# Patient Record
Sex: Female | Born: 1965 | Race: White | Hispanic: No | Marital: Married | Smoking: Current every day smoker
Health system: Southern US, Community
[De-identification: ages and names within clinical notes are randomized; demographics above are authoritative.]

## PROBLEM LIST (undated history)

## (undated) DIAGNOSIS — I890 Lymphedema, not elsewhere classified: Secondary | ICD-10-CM

## (undated) DIAGNOSIS — C50919 Malignant neoplasm of unspecified site of unspecified female breast: Secondary | ICD-10-CM

## (undated) DIAGNOSIS — C773 Secondary and unspecified malignant neoplasm of axilla and upper limb lymph nodes: Secondary | ICD-10-CM

## (undated) HISTORY — DX: Malignant neoplasm of unspecified site of unspecified female breast: C50.919

## (undated) HISTORY — DX: Secondary and unspecified malignant neoplasm of axilla and upper limb lymph nodes: C77.3

---

## 1999-08-10 ENCOUNTER — Other Ambulatory Visit: Admission: RE | Admit: 1999-08-10 | Discharge: 1999-08-10 | Payer: Self-pay | Admitting: *Deleted

## 2000-09-07 ENCOUNTER — Other Ambulatory Visit: Admission: RE | Admit: 2000-09-07 | Discharge: 2000-09-07 | Payer: Self-pay | Admitting: *Deleted

## 2001-10-03 ENCOUNTER — Other Ambulatory Visit: Admission: RE | Admit: 2001-10-03 | Discharge: 2001-10-03 | Payer: Self-pay | Admitting: *Deleted

## 2001-10-07 ENCOUNTER — Encounter: Admission: RE | Admit: 2001-10-07 | Discharge: 2001-10-07 | Payer: Self-pay | Admitting: *Deleted

## 2001-10-07 ENCOUNTER — Encounter: Payer: Self-pay | Admitting: *Deleted

## 2002-12-05 ENCOUNTER — Other Ambulatory Visit: Admission: RE | Admit: 2002-12-05 | Discharge: 2002-12-05 | Payer: Self-pay | Admitting: *Deleted

## 2004-01-28 ENCOUNTER — Other Ambulatory Visit: Admission: RE | Admit: 2004-01-28 | Discharge: 2004-01-28 | Payer: Self-pay | Admitting: *Deleted

## 2006-09-06 ENCOUNTER — Encounter: Admission: RE | Admit: 2006-09-06 | Discharge: 2006-09-06 | Payer: Self-pay | Admitting: Obstetrics and Gynecology

## 2006-11-01 ENCOUNTER — Inpatient Hospital Stay (HOSPITAL_COMMUNITY): Admission: AD | Admit: 2006-11-01 | Discharge: 2006-11-04 | Payer: Self-pay | Admitting: Obstetrics and Gynecology

## 2007-07-25 ENCOUNTER — Encounter: Admission: RE | Admit: 2007-07-25 | Discharge: 2007-07-25 | Payer: Self-pay | Admitting: Obstetrics and Gynecology

## 2007-08-02 ENCOUNTER — Encounter (INDEPENDENT_AMBULATORY_CARE_PROVIDER_SITE_OTHER): Payer: Self-pay | Admitting: Diagnostic Radiology

## 2007-08-02 ENCOUNTER — Encounter: Admission: RE | Admit: 2007-08-02 | Discharge: 2007-08-02 | Payer: Self-pay | Admitting: Obstetrics and Gynecology

## 2007-08-02 DIAGNOSIS — C50919 Malignant neoplasm of unspecified site of unspecified female breast: Secondary | ICD-10-CM

## 2007-08-02 HISTORY — DX: Malignant neoplasm of unspecified site of unspecified female breast: C50.919

## 2007-08-07 ENCOUNTER — Ambulatory Visit: Payer: Self-pay | Admitting: Oncology

## 2007-08-11 ENCOUNTER — Encounter: Admission: RE | Admit: 2007-08-11 | Discharge: 2007-08-11 | Payer: Self-pay | Admitting: Obstetrics and Gynecology

## 2007-08-13 LAB — COMPREHENSIVE METABOLIC PANEL
Albumin: 4.4 g/dL (ref 3.5–5.2)
BUN: 11 mg/dL (ref 6–23)
CO2: 23 mEq/L (ref 19–32)
Calcium: 9.1 mg/dL (ref 8.4–10.5)
Chloride: 106 mEq/L (ref 96–112)
Glucose, Bld: 127 mg/dL — ABNORMAL HIGH (ref 70–99)
Potassium: 3.8 mEq/L (ref 3.5–5.3)

## 2007-08-13 LAB — CBC WITH DIFFERENTIAL/PLATELET
Basophils Absolute: 0 10*3/uL (ref 0.0–0.1)
Eosinophils Absolute: 0.1 10*3/uL (ref 0.0–0.5)
HGB: 15.5 g/dL (ref 11.6–15.9)
MCV: 98.8 fL (ref 81.0–101.0)
MONO#: 0.4 10*3/uL (ref 0.1–0.9)
MONO%: 6 % (ref 0.0–13.0)
NEUT#: 3.4 10*3/uL (ref 1.5–6.5)
RDW: 11.9 % (ref 11.3–14.5)
lymph#: 2.3 10*3/uL (ref 0.9–3.3)

## 2007-08-13 LAB — CANCER ANTIGEN 27.29: CA 27.29: 16 U/mL (ref 0–39)

## 2007-08-13 LAB — LACTATE DEHYDROGENASE: LDH: 121 U/L (ref 94–250)

## 2007-08-16 ENCOUNTER — Ambulatory Visit (HOSPITAL_COMMUNITY): Admission: RE | Admit: 2007-08-16 | Discharge: 2007-08-16 | Payer: Self-pay | Admitting: Oncology

## 2007-08-20 ENCOUNTER — Ambulatory Visit (HOSPITAL_COMMUNITY): Admission: RE | Admit: 2007-08-20 | Discharge: 2007-08-20 | Payer: Self-pay | Admitting: Oncology

## 2007-08-21 ENCOUNTER — Ambulatory Visit: Payer: Self-pay

## 2007-08-21 ENCOUNTER — Encounter: Payer: Self-pay | Admitting: Oncology

## 2007-09-06 ENCOUNTER — Ambulatory Visit (HOSPITAL_BASED_OUTPATIENT_CLINIC_OR_DEPARTMENT_OTHER): Admission: RE | Admit: 2007-09-06 | Discharge: 2007-09-06 | Payer: Self-pay | Admitting: General Surgery

## 2007-09-06 ENCOUNTER — Encounter (INDEPENDENT_AMBULATORY_CARE_PROVIDER_SITE_OTHER): Payer: Self-pay | Admitting: General Surgery

## 2007-09-09 LAB — CBC WITH DIFFERENTIAL/PLATELET
BASO%: 0.8 % (ref 0.0–2.0)
EOS%: 0.9 % (ref 0.0–7.0)
LYMPH%: 20.5 % (ref 14.0–48.0)
MCH: UNDETERMINED pg (ref 26.0–34.0)
MCHC: UNDETERMINED g/dL (ref 32.0–36.0)
MONO#: 0.5 10*3/uL (ref 0.1–0.9)
RBC: UNDETERMINED 10*6/uL (ref 3.70–5.32)
RDW: 10 % — ABNORMAL LOW (ref 11.3–14.5)
WBC: 7.8 10*3/uL (ref 3.9–10.0)
lymph#: 1.6 10*3/uL (ref 0.9–3.3)

## 2007-09-16 ENCOUNTER — Ambulatory Visit: Payer: Self-pay | Admitting: Oncology

## 2007-09-20 LAB — COMPREHENSIVE METABOLIC PANEL
ALT: 12 U/L (ref 0–35)
AST: 11 U/L (ref 0–37)
Albumin: 3.7 g/dL (ref 3.5–5.2)
CO2: 24 mEq/L (ref 19–32)
Calcium: 8.6 mg/dL (ref 8.4–10.5)
Chloride: 103 mEq/L (ref 96–112)
Creatinine, Ser: 0.64 mg/dL (ref 0.40–1.20)
Potassium: 4.1 mEq/L (ref 3.5–5.3)

## 2007-09-20 LAB — CBC WITH DIFFERENTIAL/PLATELET
BASO%: 2.1 % — ABNORMAL HIGH (ref 0.0–2.0)
Basophils Absolute: 0.1 10*3/uL (ref 0.0–0.1)
EOS%: 0.4 % (ref 0.0–7.0)
HCT: UNDETERMINED % (ref 34.8–46.6)
HGB: 14.5 g/dL (ref 11.6–15.9)
MCH: UNDETERMINED pg (ref 26.0–34.0)
MCHC: UNDETERMINED g/dL (ref 32.0–36.0)
MONO#: 0.8 10*3/uL (ref 0.1–0.9)
NEUT%: 54.3 % (ref 39.6–76.8)
RDW: UNDETERMINED % (ref 11.3–14.5)
WBC: 4.8 10*3/uL (ref 3.9–10.0)
lymph#: 1.3 10*3/uL (ref 0.9–3.3)

## 2007-09-27 LAB — CBC WITH DIFFERENTIAL/PLATELET
BASO%: 3.7 % — ABNORMAL HIGH (ref 0.0–2.0)
EOS%: 0.1 % (ref 0.0–7.0)
HCT: 36.3 % (ref 34.8–46.6)
MCH: 35.2 pg — ABNORMAL HIGH (ref 26.0–34.0)
MCHC: 36.1 g/dL — ABNORMAL HIGH (ref 32.0–36.0)
MONO#: 0.3 10*3/uL (ref 0.1–0.9)
RBC: 3.73 10*6/uL (ref 3.70–5.32)
RDW: 12.6 % (ref 11.3–14.5)
WBC: 10.3 10*3/uL — ABNORMAL HIGH (ref 3.9–10.0)
lymph#: 1.8 10*3/uL (ref 0.9–3.3)

## 2007-10-04 LAB — CBC WITH DIFFERENTIAL/PLATELET
EOS%: 0 % (ref 0.0–7.0)
LYMPH%: 4.1 % — ABNORMAL LOW (ref 14.0–48.0)
MCH: UNDETERMINED pg (ref 26.0–34.0)
MCHC: UNDETERMINED g/dL (ref 32.0–36.0)
MCV: UNDETERMINED fL (ref 81.0–101.0)
MONO%: 0.6 % (ref 0.0–13.0)
RBC: UNDETERMINED 10*6/uL (ref 3.70–5.32)
RDW: UNDETERMINED % (ref 11.3–14.5)

## 2007-10-04 LAB — COMPREHENSIVE METABOLIC PANEL
AST: 15 U/L (ref 0–37)
Albumin: 3.8 g/dL (ref 3.5–5.2)
Alkaline Phosphatase: 54 U/L (ref 39–117)
Potassium: 3.6 mEq/L (ref 3.5–5.3)
Sodium: 139 mEq/L (ref 135–145)
Total Bilirubin: 0.4 mg/dL (ref 0.3–1.2)
Total Protein: 6.1 g/dL (ref 6.0–8.3)

## 2007-10-11 LAB — CBC WITH DIFFERENTIAL/PLATELET
BASO%: 0.3 % (ref 0.0–2.0)
Basophils Absolute: 0 10*3/uL (ref 0.0–0.1)
EOS%: 0.2 % (ref 0.0–7.0)
Eosinophils Absolute: 0 10*3/uL (ref 0.0–0.5)
HCT: 34.9 % (ref 34.8–46.6)
HGB: 12.7 g/dL (ref 11.6–15.9)
LYMPH%: 13.7 % — ABNORMAL LOW (ref 14.0–48.0)
MCH: 35.5 pg — ABNORMAL HIGH (ref 26.0–34.0)
MCHC: 36.5 g/dL — ABNORMAL HIGH (ref 32.0–36.0)
MCV: 97.4 fL (ref 81.0–101.0)
MONO#: 0.8 10*3/uL (ref 0.1–0.9)
MONO%: 11.9 % (ref 0.0–13.0)
NEUT#: 5.2 10*3/uL (ref 1.5–6.5)
NEUT%: 73.9 % (ref 39.6–76.8)
Platelets: 73 10*3/uL — ABNORMAL LOW (ref 145–400)
RBC: 3.58 10*6/uL — ABNORMAL LOW (ref 3.70–5.32)
RDW: 12.8 % (ref 11.3–14.5)
WBC: 7 10*3/uL (ref 3.9–10.0)
lymph#: 1 10*3/uL (ref 0.9–3.3)

## 2007-10-18 LAB — CBC WITH DIFFERENTIAL/PLATELET
BASO%: 1.1 % (ref 0.0–2.0)
EOS%: 0.1 % (ref 0.0–7.0)
Eosinophils Absolute: 0 10*3/uL (ref 0.0–0.5)
LYMPH%: 14.6 % (ref 14.0–48.0)
MCH: 35.3 pg — ABNORMAL HIGH (ref 26.0–34.0)
MCHC: 36.8 g/dL — ABNORMAL HIGH (ref 32.0–36.0)
MCV: 96 fL (ref 81.0–101.0)
MONO%: 3.9 % (ref 0.0–13.0)
NEUT#: 8.6 10*3/uL — ABNORMAL HIGH (ref 1.5–6.5)
Platelets: 262 10*3/uL (ref 145–400)
RBC: 3.25 10*6/uL — ABNORMAL LOW (ref 3.70–5.32)
RDW: 11.2 % — ABNORMAL LOW (ref 11.3–14.5)

## 2007-10-23 LAB — COMPREHENSIVE METABOLIC PANEL
AST: 16 U/L (ref 0–37)
Albumin: 4.1 g/dL (ref 3.5–5.2)
BUN: 20 mg/dL (ref 6–23)
CO2: 26 mEq/L (ref 19–32)
Calcium: 9.4 mg/dL (ref 8.4–10.5)
Chloride: 102 mEq/L (ref 96–112)
Glucose, Bld: 133 mg/dL — ABNORMAL HIGH (ref 70–99)
Potassium: 3.8 mEq/L (ref 3.5–5.3)

## 2007-10-23 LAB — CBC WITH DIFFERENTIAL/PLATELET
BASO%: 0.7 % (ref 0.0–2.0)
Eosinophils Absolute: 0 10*3/uL (ref 0.0–0.5)
LYMPH%: 23 % (ref 14.0–48.0)
MCHC: 34.9 g/dL (ref 32.0–36.0)
MONO#: 0.3 10*3/uL (ref 0.1–0.9)
NEUT#: 3.7 10*3/uL (ref 1.5–6.5)
RBC: 3.63 10*6/uL — ABNORMAL LOW (ref 3.70–5.32)
RDW: 14.1 % (ref 11.3–14.5)
WBC: 5.2 10*3/uL (ref 3.9–10.0)
lymph#: 1.2 10*3/uL (ref 0.9–3.3)

## 2007-10-25 ENCOUNTER — Ambulatory Visit: Payer: Self-pay | Admitting: Oncology

## 2007-10-31 ENCOUNTER — Encounter: Admission: RE | Admit: 2007-10-31 | Discharge: 2007-10-31 | Payer: Self-pay | Admitting: Oncology

## 2007-11-01 LAB — CBC WITH DIFFERENTIAL/PLATELET
Basophils Absolute: 0 10*3/uL (ref 0.0–0.1)
EOS%: 0.4 % (ref 0.0–7.0)
HGB: 11.7 g/dL (ref 11.6–15.9)
MCH: 35.8 pg — ABNORMAL HIGH (ref 26.0–34.0)
NEUT#: 4.5 10*3/uL (ref 1.5–6.5)
RDW: 14.9 % — ABNORMAL HIGH (ref 11.3–14.5)
lymph#: 1.8 10*3/uL (ref 0.9–3.3)

## 2007-11-08 LAB — COMPREHENSIVE METABOLIC PANEL
ALT: 16 U/L (ref 0–35)
AST: 13 U/L (ref 0–37)
Albumin: 4.2 g/dL (ref 3.5–5.2)
BUN: 15 mg/dL (ref 6–23)
Calcium: 8.9 mg/dL (ref 8.4–10.5)
Chloride: 104 mEq/L (ref 96–112)
Potassium: 4.2 mEq/L (ref 3.5–5.3)
Total Protein: 6.4 g/dL (ref 6.0–8.3)

## 2007-11-08 LAB — CBC WITH DIFFERENTIAL/PLATELET
BASO%: 0.6 % (ref 0.0–2.0)
EOS%: 0.2 % (ref 0.0–7.0)
HGB: 12.6 g/dL (ref 11.6–15.9)
MCH: UNDETERMINED pg (ref 26.0–34.0)
MCHC: UNDETERMINED g/dL (ref 32.0–36.0)
RBC: UNDETERMINED 10*6/uL (ref 3.70–5.32)
RDW: 13.7 % (ref 11.3–14.5)
lymph#: 1.8 10*3/uL (ref 0.9–3.3)

## 2007-11-15 LAB — CBC WITH DIFFERENTIAL/PLATELET
Basophils Absolute: 0 10*3/uL (ref 0.0–0.1)
EOS%: 0 % (ref 0.0–7.0)
Eosinophils Absolute: 0 10*3/uL (ref 0.0–0.5)
HGB: 12.5 g/dL (ref 11.6–15.9)
NEUT#: 8.2 10*3/uL — ABNORMAL HIGH (ref 1.5–6.5)
RBC: UNDETERMINED 10*6/uL (ref 3.70–5.32)
RDW: UNDETERMINED % (ref 11.3–14.5)
lymph#: 0.6 10*3/uL — ABNORMAL LOW (ref 0.9–3.3)

## 2007-11-22 LAB — CBC WITH DIFFERENTIAL/PLATELET
Basophils Absolute: 0.1 10*3/uL (ref 0.0–0.1)
Eosinophils Absolute: 0 10*3/uL (ref 0.0–0.5)
HGB: 11.3 g/dL — ABNORMAL LOW (ref 11.6–15.9)
MCV: UNDETERMINED fL (ref 81.0–101.0)
MONO#: 0.8 10*3/uL (ref 0.1–0.9)
MONO%: 19.4 % — ABNORMAL HIGH (ref 0.0–13.0)
NEUT#: 1.8 10*3/uL (ref 1.5–6.5)
Platelets: 68 10*3/uL — ABNORMAL LOW (ref 145–400)
RDW: UNDETERMINED % (ref 11.3–14.5)
WBC: 4.1 10*3/uL (ref 3.9–10.0)

## 2007-11-29 LAB — CBC WITH DIFFERENTIAL/PLATELET
BASO%: 3.3 % — ABNORMAL HIGH (ref 0.0–2.0)
Basophils Absolute: 0.3 10*3/uL — ABNORMAL HIGH (ref 0.0–0.1)
Eosinophils Absolute: 0 10*3/uL (ref 0.0–0.5)
HCT: 32.8 % — ABNORMAL LOW (ref 34.8–46.6)
HGB: 11.9 g/dL (ref 11.6–15.9)
LYMPH%: 16.8 % (ref 14.0–48.0)
MCHC: 36.2 g/dL — ABNORMAL HIGH (ref 32.0–36.0)
MONO#: 0.2 10*3/uL (ref 0.1–0.9)
NEUT%: 77.7 % — ABNORMAL HIGH (ref 39.6–76.8)
Platelets: 153 10*3/uL (ref 145–400)
WBC: 9.4 10*3/uL (ref 3.9–10.0)

## 2007-12-06 LAB — CBC WITH DIFFERENTIAL/PLATELET
BASO%: 0.1 % (ref 0.0–2.0)
Basophils Absolute: 0 10*3/uL (ref 0.0–0.1)
EOS%: 0.1 % (ref 0.0–7.0)
HCT: UNDETERMINED % (ref 34.8–46.6)
HGB: 12.2 g/dL (ref 11.6–15.9)
LYMPH%: 8.8 % — ABNORMAL LOW (ref 14.0–48.0)
MCH: UNDETERMINED pg (ref 26.0–34.0)
MCHC: UNDETERMINED g/dL (ref 32.0–36.0)
MCV: UNDETERMINED fL (ref 81.0–101.0)
MONO%: 3.1 % (ref 0.0–13.0)
NEUT%: 87.8 % — ABNORMAL HIGH (ref 39.6–76.8)
Platelets: 131 10*3/uL — ABNORMAL LOW (ref 145–400)

## 2007-12-06 LAB — COMPREHENSIVE METABOLIC PANEL
ALT: 13 U/L (ref 0–35)
AST: 14 U/L (ref 0–37)
BUN: 18 mg/dL (ref 6–23)
Creatinine, Ser: 0.75 mg/dL (ref 0.40–1.20)
Total Bilirubin: 0.4 mg/dL (ref 0.3–1.2)

## 2007-12-11 ENCOUNTER — Ambulatory Visit: Payer: Self-pay | Admitting: Oncology

## 2007-12-13 LAB — URINALYSIS, MICROSCOPIC - CHCC
Ketones: NEGATIVE mg/dL
Protein: 30 mg/dL
Specific Gravity, Urine: 1.02 (ref 1.003–1.035)
pH: 6.5 (ref 4.6–8.0)

## 2007-12-13 LAB — CBC WITH DIFFERENTIAL/PLATELET
BASO%: 0.3 % (ref 0.0–2.0)
Basophils Absolute: 0 10*3/uL (ref 0.0–0.1)
EOS%: 0.1 % (ref 0.0–7.0)
Eosinophils Absolute: 0 10*3/uL (ref 0.0–0.5)
HCT: 31.2 % — ABNORMAL LOW (ref 34.8–46.6)
HGB: 11.1 g/dL — ABNORMAL LOW (ref 11.6–15.9)
LYMPH%: 36.8 % (ref 14.0–48.0)
MCH: 37.9 pg — ABNORMAL HIGH (ref 26.0–34.0)
MCHC: 35.7 g/dL (ref 32.0–36.0)
MCV: 106.1 fL — ABNORMAL HIGH (ref 81.0–101.0)
MONO#: 0.7 10*3/uL (ref 0.1–0.9)
MONO%: 21.3 % — ABNORMAL HIGH (ref 0.0–13.0)
NEUT#: 1.3 10*3/uL — ABNORMAL LOW (ref 1.5–6.5)
NEUT%: 41.5 % (ref 39.6–76.8)
Platelets: 80 10*3/uL — ABNORMAL LOW (ref 145–400)
RBC: 2.94 10*6/uL — ABNORMAL LOW (ref 3.70–5.32)
RDW: 14.3 % (ref 11.3–14.5)
WBC: 3.1 10*3/uL — ABNORMAL LOW (ref 3.9–10.0)
lymph#: 1.2 10*3/uL (ref 0.9–3.3)

## 2007-12-15 LAB — URINE CULTURE

## 2007-12-20 LAB — CBC WITH DIFFERENTIAL/PLATELET
Basophils Absolute: 0.1 10*3/uL (ref 0.0–0.1)
EOS%: 1.2 % (ref 0.0–7.0)
HGB: 12.1 g/dL (ref 11.6–15.9)
MCH: 39.1 pg — ABNORMAL HIGH (ref 26.0–34.0)
NEUT#: 6 10*3/uL (ref 1.5–6.5)
RDW: 13.8 % (ref 11.3–14.5)
lymph#: 1.8 10*3/uL (ref 0.9–3.3)

## 2007-12-27 LAB — CBC WITH DIFFERENTIAL/PLATELET
BASO%: 0.1 % (ref 0.0–2.0)
Eosinophils Absolute: 0 10*3/uL (ref 0.0–0.5)
HCT: 34.8 % (ref 34.8–46.6)
LYMPH%: 6.8 % — ABNORMAL LOW (ref 14.0–48.0)
MCHC: 35 g/dL (ref 32.0–36.0)
MONO#: 0.1 10*3/uL (ref 0.1–0.9)
NEUT%: 92.1 % — ABNORMAL HIGH (ref 39.6–76.8)
Platelets: 114 10*3/uL — ABNORMAL LOW (ref 145–400)
WBC: 8.6 10*3/uL (ref 3.9–10.0)

## 2007-12-27 LAB — COMPREHENSIVE METABOLIC PANEL
ALT: 11 U/L (ref 0–35)
CO2: 23 mEq/L (ref 19–32)
Creatinine, Ser: 0.71 mg/dL (ref 0.40–1.20)
Total Bilirubin: 0.3 mg/dL (ref 0.3–1.2)

## 2008-01-03 LAB — CBC WITH DIFFERENTIAL/PLATELET
Eosinophils Absolute: 0 10*3/uL (ref 0.0–0.5)
MONO#: 0.8 10*3/uL (ref 0.1–0.9)
MONO%: 18.7 % — ABNORMAL HIGH (ref 0.0–13.0)
NEUT#: 1.9 10*3/uL (ref 1.5–6.5)
RBC: 3.17 10*6/uL — ABNORMAL LOW (ref 3.70–5.32)
RDW: 11.5 % (ref 11.3–14.5)
WBC: 4.2 10*3/uL (ref 3.9–10.0)

## 2008-01-06 ENCOUNTER — Encounter: Admission: RE | Admit: 2008-01-06 | Discharge: 2008-01-06 | Payer: Self-pay | Admitting: Oncology

## 2008-01-10 LAB — CBC WITH DIFFERENTIAL/PLATELET
Eosinophils Absolute: 0 10*3/uL (ref 0.0–0.5)
HCT: 34.2 % — ABNORMAL LOW (ref 34.8–46.6)
LYMPH%: 18.6 % (ref 14.0–48.0)
MONO#: 0.4 10*3/uL (ref 0.1–0.9)
NEUT#: 7.5 10*3/uL — ABNORMAL HIGH (ref 1.5–6.5)
Platelets: 129 10*3/uL — ABNORMAL LOW (ref 145–400)
RBC: 3.16 10*6/uL — ABNORMAL LOW (ref 3.70–5.32)
WBC: 9.8 10*3/uL (ref 3.9–10.0)
lymph#: 1.8 10*3/uL (ref 0.9–3.3)

## 2008-01-20 ENCOUNTER — Encounter (INDEPENDENT_AMBULATORY_CARE_PROVIDER_SITE_OTHER): Payer: Self-pay | Admitting: General Surgery

## 2008-01-20 ENCOUNTER — Ambulatory Visit (HOSPITAL_BASED_OUTPATIENT_CLINIC_OR_DEPARTMENT_OTHER): Admission: RE | Admit: 2008-01-20 | Discharge: 2008-01-20 | Payer: Self-pay | Admitting: General Surgery

## 2008-01-20 ENCOUNTER — Encounter: Admission: RE | Admit: 2008-01-20 | Discharge: 2008-01-20 | Payer: Self-pay | Admitting: General Surgery

## 2008-02-05 ENCOUNTER — Ambulatory Visit: Payer: Self-pay | Admitting: Oncology

## 2008-02-07 LAB — CBC WITH DIFFERENTIAL/PLATELET
BASO%: 1.6 % (ref 0.0–2.0)
EOS%: 1.2 % (ref 0.0–7.0)
HCT: 36.8 % (ref 34.8–46.6)
LYMPH%: 42.7 % (ref 14.0–48.0)
MCH: 38.1 pg — ABNORMAL HIGH (ref 26.0–34.0)
MCHC: 36.8 g/dL — ABNORMAL HIGH (ref 32.0–36.0)
NEUT%: 45.3 % (ref 39.6–76.8)
Platelets: 152 10*3/uL (ref 145–400)
RBC: 3.56 10*6/uL — ABNORMAL LOW (ref 3.70–5.32)
lymph#: 1.6 10*3/uL (ref 0.9–3.3)

## 2008-02-07 LAB — COMPREHENSIVE METABOLIC PANEL
ALT: 10 U/L (ref 0–35)
AST: 14 U/L (ref 0–37)
Alkaline Phosphatase: 51 U/L (ref 39–117)
Creatinine, Ser: 0.71 mg/dL (ref 0.40–1.20)
Total Bilirubin: 0.4 mg/dL (ref 0.3–1.2)

## 2008-02-10 ENCOUNTER — Ambulatory Visit: Admission: RE | Admit: 2008-02-10 | Discharge: 2008-05-10 | Payer: Self-pay | Admitting: Radiation Oncology

## 2008-02-12 ENCOUNTER — Ambulatory Visit: Admission: RE | Admit: 2008-02-12 | Discharge: 2008-02-12 | Payer: Self-pay | Admitting: Oncology

## 2008-02-12 ENCOUNTER — Ambulatory Visit: Payer: Self-pay | Admitting: Cardiology

## 2008-02-12 ENCOUNTER — Encounter: Payer: Self-pay | Admitting: Oncology

## 2008-02-14 LAB — CBC WITH DIFFERENTIAL/PLATELET
BASO%: 1.2 % (ref 0.0–2.0)
EOS%: 2.1 % (ref 0.0–7.0)
HCT: 37.2 % (ref 34.8–46.6)
LYMPH%: 41.8 % (ref 14.0–48.0)
MCH: 38 pg — ABNORMAL HIGH (ref 26.0–34.0)
MCHC: 36.3 g/dL — ABNORMAL HIGH (ref 32.0–36.0)
MCV: 104.8 fL — ABNORMAL HIGH (ref 81.0–101.0)
MONO#: 0.4 10*3/uL (ref 0.1–0.9)
NEUT%: 47 % (ref 39.6–76.8)
Platelets: 145 10*3/uL (ref 145–400)

## 2008-02-21 LAB — CBC WITH DIFFERENTIAL/PLATELET
BASO%: 1 % (ref 0.0–2.0)
Basophils Absolute: 0.1 10e3/uL (ref 0.0–0.1)
EOS%: 1.5 % (ref 0.0–7.0)
Eosinophils Absolute: 0.1 10e3/uL (ref 0.0–0.5)
HCT: 36.9 % (ref 34.8–46.6)
HGB: 13.5 g/dL (ref 11.6–15.9)
LYMPH%: 41.1 % (ref 14.0–48.0)
MCH: 37.7 pg — ABNORMAL HIGH (ref 26.0–34.0)
MCHC: 36.6 g/dL — ABNORMAL HIGH (ref 32.0–36.0)
MCV: 103 fL — ABNORMAL HIGH (ref 81.0–101.0)
MONO#: 0.4 10e3/uL (ref 0.1–0.9)
MONO%: 8.9 % (ref 0.0–13.0)
NEUT#: 2.4 10e3/uL (ref 1.5–6.5)
NEUT%: 47.5 % (ref 39.6–76.8)
Platelets: 140 10e3/uL — ABNORMAL LOW (ref 145–400)
RBC: 3.59 10e6/uL — ABNORMAL LOW (ref 3.70–5.32)
RDW: 10.1 % — ABNORMAL LOW (ref 11.3–14.5)
WBC: 5 10e3/uL (ref 3.9–10.0)
lymph#: 2.1 10e3/uL (ref 0.9–3.3)

## 2008-02-21 LAB — COMPREHENSIVE METABOLIC PANEL
AST: 14 U/L (ref 0–37)
BUN: 12 mg/dL (ref 6–23)
CO2: 26 mEq/L (ref 19–32)
Calcium: 9 mg/dL (ref 8.4–10.5)
Chloride: 108 mEq/L (ref 96–112)
Creatinine, Ser: 0.7 mg/dL (ref 0.40–1.20)

## 2008-02-21 LAB — LACTATE DEHYDROGENASE: LDH: 181 U/L (ref 94–250)

## 2008-02-28 LAB — COMPREHENSIVE METABOLIC PANEL
ALT: 11 U/L (ref 0–35)
AST: 15 U/L (ref 0–37)
Calcium: 9.3 mg/dL (ref 8.4–10.5)
Chloride: 105 mEq/L (ref 96–112)
Creatinine, Ser: 0.68 mg/dL (ref 0.40–1.20)
Sodium: 140 mEq/L (ref 135–145)
Total Bilirubin: 0.5 mg/dL (ref 0.3–1.2)
Total Protein: 6.3 g/dL (ref 6.0–8.3)

## 2008-02-28 LAB — CBC WITH DIFFERENTIAL/PLATELET
BASO%: 0.9 % (ref 0.0–2.0)
EOS%: 0.9 % (ref 0.0–7.0)
HCT: 39.5 % (ref 34.8–46.6)
MCH: 36.4 pg — ABNORMAL HIGH (ref 26.0–34.0)
MCHC: 36.3 g/dL — ABNORMAL HIGH (ref 32.0–36.0)
MONO#: 0.3 10*3/uL (ref 0.1–0.9)
NEUT%: 55.9 % (ref 39.6–76.8)
RBC: 3.94 10*6/uL (ref 3.70–5.32)
WBC: 5.5 10*3/uL (ref 3.9–10.0)
lymph#: 2 10*3/uL (ref 0.9–3.3)

## 2008-03-06 LAB — CBC WITH DIFFERENTIAL/PLATELET
BASO%: 0.8 % (ref 0.0–2.0)
EOS%: 0.8 % (ref 0.0–7.0)
HCT: 40.4 % (ref 34.8–46.6)
LYMPH%: 40.7 % (ref 14.0–48.0)
MCH: 36.7 pg — ABNORMAL HIGH (ref 26.0–34.0)
MCHC: 36.1 g/dL — ABNORMAL HIGH (ref 32.0–36.0)
NEUT%: 49.7 % (ref 39.6–76.8)
Platelets: 159 10*3/uL (ref 145–400)
RBC: 3.98 10*6/uL (ref 3.70–5.32)

## 2008-03-10 ENCOUNTER — Encounter (INDEPENDENT_AMBULATORY_CARE_PROVIDER_SITE_OTHER): Payer: Self-pay | Admitting: Obstetrics and Gynecology

## 2008-03-10 ENCOUNTER — Ambulatory Visit (HOSPITAL_COMMUNITY): Admission: RE | Admit: 2008-03-10 | Discharge: 2008-03-10 | Payer: Self-pay | Admitting: Obstetrics and Gynecology

## 2008-03-18 ENCOUNTER — Ambulatory Visit: Payer: Self-pay | Admitting: Oncology

## 2008-03-27 LAB — CBC WITH DIFFERENTIAL/PLATELET
Basophils Absolute: 0 10*3/uL (ref 0.0–0.1)
Eosinophils Absolute: 0.1 10*3/uL (ref 0.0–0.5)
HGB: 14.3 g/dL (ref 11.6–15.9)
LYMPH%: 29.2 % (ref 14.0–48.0)
MCV: 98.1 fL (ref 81.0–101.0)
MONO#: 0.3 10*3/uL (ref 0.1–0.9)
MONO%: 7 % (ref 0.0–13.0)
NEUT#: 3 10*3/uL (ref 1.5–6.5)
Platelets: 158 10*3/uL (ref 145–400)
RDW: 10.2 % — ABNORMAL LOW (ref 11.3–14.5)
WBC: 4.8 10*3/uL (ref 3.9–10.0)

## 2008-04-09 LAB — URINALYSIS, MICROSCOPIC - CHCC
Bilirubin (Urine): NEGATIVE
Glucose: NEGATIVE g/dL
Specific Gravity, Urine: 1.03 (ref 1.003–1.035)
pH: 6 (ref 4.6–8.0)

## 2008-04-10 LAB — COMPREHENSIVE METABOLIC PANEL
ALT: 12 U/L (ref 0–35)
Albumin: 4.2 g/dL (ref 3.5–5.2)
CO2: 22 mEq/L (ref 19–32)
Calcium: 9.3 mg/dL (ref 8.4–10.5)
Chloride: 103 mEq/L (ref 96–112)
Glucose, Bld: 219 mg/dL — ABNORMAL HIGH (ref 70–99)
Potassium: 3.5 mEq/L (ref 3.5–5.3)
Sodium: 138 mEq/L (ref 135–145)
Total Bilirubin: 0.5 mg/dL (ref 0.3–1.2)
Total Protein: 6.6 g/dL (ref 6.0–8.3)

## 2008-04-10 LAB — CBC WITH DIFFERENTIAL/PLATELET
BASO%: 1.2 % (ref 0.0–2.0)
Eosinophils Absolute: 0 10*3/uL (ref 0.0–0.5)
MCHC: UNDETERMINED g/dL (ref 32.0–36.0)
MONO#: 0.5 10*3/uL (ref 0.1–0.9)
NEUT#: 2.6 10*3/uL (ref 1.5–6.5)
Platelets: 115 10*3/uL — ABNORMAL LOW (ref 145–400)
RBC: UNDETERMINED 10*6/uL (ref 3.70–5.32)
RDW: UNDETERMINED % (ref 11.3–14.5)
WBC: 4.2 10*3/uL (ref 3.9–10.0)
lymph#: 1 10*3/uL (ref 0.9–3.3)

## 2008-04-12 LAB — URINE CULTURE

## 2008-04-28 ENCOUNTER — Ambulatory Visit: Payer: Self-pay | Admitting: Oncology

## 2008-05-01 LAB — CBC WITH DIFFERENTIAL/PLATELET
BASO%: 1 % (ref 0.0–2.0)
LYMPH%: 28 % (ref 14.0–48.0)
MCHC: 36.1 g/dL — ABNORMAL HIGH (ref 32.0–36.0)
MCV: 96.5 fL (ref 81.0–101.0)
MONO%: 6.8 % (ref 0.0–13.0)
Platelets: 144 10*3/uL — ABNORMAL LOW (ref 145–400)
RBC: 3.99 10*6/uL (ref 3.70–5.32)
WBC: 4.6 10*3/uL (ref 3.9–10.0)

## 2008-05-22 LAB — CBC WITH DIFFERENTIAL/PLATELET
BASO%: 0.7 % (ref 0.0–2.0)
MCHC: 34.3 g/dL (ref 32.0–36.0)
MONO#: 0.3 10*3/uL (ref 0.1–0.9)
RBC: 4.21 10*6/uL (ref 3.70–5.32)
RDW: 11.6 % (ref 11.3–14.5)
WBC: 5.1 10*3/uL (ref 3.9–10.0)
lymph#: 1.3 10*3/uL (ref 0.9–3.3)

## 2008-05-22 LAB — BASIC METABOLIC PANEL
CO2: 26 mEq/L (ref 19–32)
Calcium: 8.9 mg/dL (ref 8.4–10.5)
Chloride: 106 mEq/L (ref 96–112)
Sodium: 141 mEq/L (ref 135–145)

## 2008-06-09 ENCOUNTER — Encounter: Payer: Self-pay | Admitting: Oncology

## 2008-06-09 ENCOUNTER — Ambulatory Visit: Admission: RE | Admit: 2008-06-09 | Discharge: 2008-06-09 | Payer: Self-pay | Admitting: Oncology

## 2008-06-12 LAB — COMPREHENSIVE METABOLIC PANEL
ALT: 13 U/L (ref 0–35)
AST: 18 U/L (ref 0–37)
Alkaline Phosphatase: 52 U/L (ref 39–117)
Glucose, Bld: 116 mg/dL — ABNORMAL HIGH (ref 70–99)
Potassium: 3.2 mEq/L — ABNORMAL LOW (ref 3.5–5.3)
Sodium: 139 mEq/L (ref 135–145)
Total Bilirubin: 0.5 mg/dL (ref 0.3–1.2)
Total Protein: 6 g/dL (ref 6.0–8.3)

## 2008-06-12 LAB — CBC WITH DIFFERENTIAL/PLATELET
Basophils Absolute: 0 10*3/uL (ref 0.0–0.1)
EOS%: 1.2 % (ref 0.0–7.0)
Eosinophils Absolute: 0.1 10*3/uL (ref 0.0–0.5)
LYMPH%: 27.8 % (ref 14.0–48.0)
MCH: 34.7 pg — ABNORMAL HIGH (ref 26.0–34.0)
MCV: 96.5 fL (ref 81.0–101.0)
MONO%: 5.4 % (ref 0.0–13.0)
NEUT#: 3.2 10*3/uL (ref 1.5–6.5)
Platelets: 154 10*3/uL (ref 145–400)
RBC: 4.04 10*6/uL (ref 3.70–5.32)

## 2008-06-30 ENCOUNTER — Ambulatory Visit: Payer: Self-pay | Admitting: Oncology

## 2008-07-03 LAB — CBC WITH DIFFERENTIAL/PLATELET
Basophils Absolute: 0 10*3/uL (ref 0.0–0.1)
EOS%: 1.1 % (ref 0.0–7.0)
Eosinophils Absolute: 0.1 10*3/uL (ref 0.0–0.5)
HCT: 41.8 % (ref 34.8–46.6)
HGB: 14.9 g/dL (ref 11.6–15.9)
MCH: 34.7 pg — ABNORMAL HIGH (ref 26.0–34.0)
MCV: 97.3 fL (ref 81.0–101.0)
MONO%: 7.5 % (ref 0.0–13.0)
NEUT#: 2.9 10*3/uL (ref 1.5–6.5)
NEUT%: 58.6 % (ref 39.6–76.8)
RDW: 11.3 % (ref 11.3–14.5)

## 2008-07-03 LAB — COMPREHENSIVE METABOLIC PANEL
AST: 15 U/L (ref 0–37)
Albumin: 4.4 g/dL (ref 3.5–5.2)
Alkaline Phosphatase: 56 U/L (ref 39–117)
BUN: 14 mg/dL (ref 6–23)
Calcium: 9.5 mg/dL (ref 8.4–10.5)
Chloride: 105 mEq/L (ref 96–112)
Creatinine, Ser: 0.67 mg/dL (ref 0.40–1.20)
Glucose, Bld: 82 mg/dL (ref 70–99)
Potassium: 3.7 mEq/L (ref 3.5–5.3)

## 2008-07-03 LAB — LACTATE DEHYDROGENASE: LDH: 142 U/L (ref 94–250)

## 2008-07-24 LAB — CBC WITH DIFFERENTIAL/PLATELET
Basophils Absolute: 0 10*3/uL (ref 0.0–0.1)
Eosinophils Absolute: 0.1 10*3/uL (ref 0.0–0.5)
HGB: 14.8 g/dL (ref 11.6–15.9)
MONO#: 0.3 10*3/uL (ref 0.1–0.9)
NEUT#: 3.2 10*3/uL (ref 1.5–6.5)
Platelets: 168 10*3/uL (ref 145–400)
RBC: 4.27 10*6/uL (ref 3.70–5.32)
RDW: 11.2 % — ABNORMAL LOW (ref 11.3–14.5)
WBC: 4.9 10*3/uL (ref 3.9–10.0)

## 2008-07-24 LAB — COMPREHENSIVE METABOLIC PANEL
Albumin: 4.3 g/dL (ref 3.5–5.2)
BUN: 15 mg/dL (ref 6–23)
CO2: 25 mEq/L (ref 19–32)
Calcium: 9.1 mg/dL (ref 8.4–10.5)
Glucose, Bld: 76 mg/dL (ref 70–99)
Potassium: 3.3 mEq/L — ABNORMAL LOW (ref 3.5–5.3)
Sodium: 140 mEq/L (ref 135–145)
Total Protein: 6.4 g/dL (ref 6.0–8.3)

## 2008-07-27 ENCOUNTER — Encounter: Admission: RE | Admit: 2008-07-27 | Discharge: 2008-07-27 | Payer: Self-pay | Admitting: Oncology

## 2008-08-14 LAB — CBC WITH DIFFERENTIAL/PLATELET
Basophils Absolute: 0 10*3/uL (ref 0.0–0.1)
Eosinophils Absolute: 0.1 10*3/uL (ref 0.0–0.5)
HCT: 39.2 % (ref 34.8–46.6)
HGB: 14 g/dL (ref 11.6–15.9)
MONO#: 0.3 10*3/uL (ref 0.1–0.9)
NEUT#: 2.5 10*3/uL (ref 1.5–6.5)
NEUT%: 62.7 % (ref 39.6–76.8)
RDW: 11 % — ABNORMAL LOW (ref 11.3–14.5)
WBC: 4 10*3/uL (ref 3.9–10.0)
lymph#: 1.1 10*3/uL (ref 0.9–3.3)

## 2008-08-14 LAB — COMPREHENSIVE METABOLIC PANEL
ALT: 8 U/L (ref 0–35)
AST: 12 U/L (ref 0–37)
Albumin: 4.1 g/dL (ref 3.5–5.2)
Alkaline Phosphatase: 57 U/L (ref 39–117)
Potassium: 3.9 mEq/L (ref 3.5–5.3)
Sodium: 138 mEq/L (ref 135–145)
Total Protein: 5.9 g/dL — ABNORMAL LOW (ref 6.0–8.3)

## 2008-08-14 LAB — CANCER ANTIGEN 27.29: CA 27.29: 13 U/mL (ref 0–39)

## 2008-09-02 ENCOUNTER — Ambulatory Visit: Payer: Self-pay | Admitting: Oncology

## 2008-09-04 LAB — CBC WITH DIFFERENTIAL/PLATELET
Basophils Absolute: 0.1 10*3/uL (ref 0.0–0.1)
Eosinophils Absolute: 0 10*3/uL (ref 0.0–0.5)
HGB: 14.5 g/dL (ref 11.6–15.9)
MCV: 96.8 fL (ref 81.0–101.0)
MONO%: 7 % (ref 0.0–13.0)
NEUT#: 2.5 10*3/uL (ref 1.5–6.5)
RBC: 4.22 10*6/uL (ref 3.70–5.32)
RDW: 11.2 % — ABNORMAL LOW (ref 11.3–14.5)
WBC: 4.2 10*3/uL (ref 3.9–10.0)
lymph#: 1.4 10*3/uL (ref 0.9–3.3)

## 2008-09-25 LAB — CBC WITH DIFFERENTIAL/PLATELET
EOS%: 1.2 % (ref 0.0–7.0)
Eosinophils Absolute: 0.1 10*3/uL (ref 0.0–0.5)
MCH: 34.1 pg — ABNORMAL HIGH (ref 26.0–34.0)
MCV: 95.6 fL (ref 81.0–101.0)
MONO%: 5.2 % (ref 0.0–13.0)
NEUT#: 4 10*3/uL (ref 1.5–6.5)
RBC: 4.14 10*6/uL (ref 3.70–5.32)
RDW: 11 % — ABNORMAL LOW (ref 11.3–14.5)
lymph#: 1.4 10*3/uL (ref 0.9–3.3)

## 2008-09-25 LAB — COMPREHENSIVE METABOLIC PANEL
Albumin: 4 g/dL (ref 3.5–5.2)
Alkaline Phosphatase: 61 U/L (ref 39–117)
BUN: 17 mg/dL (ref 6–23)
CO2: 25 mEq/L (ref 19–32)
Calcium: 9.1 mg/dL (ref 8.4–10.5)
Chloride: 104 mEq/L (ref 96–112)
Glucose, Bld: 67 mg/dL — ABNORMAL LOW (ref 70–99)
Potassium: 3.8 mEq/L (ref 3.5–5.3)
Sodium: 138 mEq/L (ref 135–145)
Total Protein: 6.1 g/dL (ref 6.0–8.3)

## 2008-10-01 ENCOUNTER — Encounter: Payer: Self-pay | Admitting: Oncology

## 2008-10-01 ENCOUNTER — Ambulatory Visit: Admission: RE | Admit: 2008-10-01 | Discharge: 2008-10-01 | Payer: Self-pay | Admitting: Oncology

## 2008-11-17 ENCOUNTER — Ambulatory Visit (HOSPITAL_COMMUNITY): Admission: RE | Admit: 2008-11-17 | Discharge: 2008-11-17 | Payer: Self-pay | Admitting: General Surgery

## 2008-12-15 ENCOUNTER — Ambulatory Visit: Payer: Self-pay | Admitting: Oncology

## 2008-12-17 LAB — COMPREHENSIVE METABOLIC PANEL
ALT: 20 U/L (ref 0–35)
CO2: 29 mEq/L (ref 19–32)
Calcium: 9.2 mg/dL (ref 8.4–10.5)
Chloride: 104 mEq/L (ref 96–112)
Creatinine, Ser: 0.65 mg/dL (ref 0.40–1.20)
Glucose, Bld: 93 mg/dL (ref 70–99)
Total Bilirubin: 0.8 mg/dL (ref 0.3–1.2)

## 2008-12-17 LAB — CBC WITH DIFFERENTIAL/PLATELET
BASO%: 0.4 % (ref 0.0–2.0)
Basophils Absolute: 0 10*3/uL (ref 0.0–0.1)
Eosinophils Absolute: 0.1 10*3/uL (ref 0.0–0.5)
HCT: 40.2 % (ref 34.8–46.6)
HGB: 14.1 g/dL (ref 11.6–15.9)
LYMPH%: 34.9 % (ref 14.0–49.7)
MCHC: 35.2 g/dL (ref 31.5–36.0)
MONO#: 0.3 10*3/uL (ref 0.1–0.9)
NEUT#: 2.4 10*3/uL (ref 1.5–6.5)
NEUT%: 55.9 % (ref 38.4–76.8)
Platelets: 162 10*3/uL (ref 145–400)
WBC: 4.3 10*3/uL (ref 3.9–10.3)
lymph#: 1.5 10*3/uL (ref 0.9–3.3)

## 2008-12-18 LAB — CANCER ANTIGEN 27.29: CA 27.29: 23 U/mL (ref 0–39)

## 2008-12-18 LAB — VITAMIN D 25 HYDROXY (VIT D DEFICIENCY, FRACTURES): Vit D, 25-Hydroxy: 12 ng/mL — ABNORMAL LOW (ref 30–89)

## 2009-03-17 ENCOUNTER — Ambulatory Visit: Payer: Self-pay | Admitting: Oncology

## 2009-03-19 LAB — CBC WITH DIFFERENTIAL/PLATELET
Basophils Absolute: 0 10*3/uL (ref 0.0–0.1)
Eosinophils Absolute: 0 10*3/uL (ref 0.0–0.5)
HCT: 41.5 % (ref 34.8–46.6)
HGB: 15 g/dL (ref 11.6–15.9)
LYMPH%: 25.2 % (ref 14.0–49.7)
MCV: 93.9 fL (ref 79.5–101.0)
MONO%: 6.4 % (ref 0.0–14.0)
NEUT#: 4.4 10*3/uL (ref 1.5–6.5)
NEUT%: 68 % (ref 38.4–76.8)
Platelets: 148 10*3/uL (ref 145–400)

## 2009-03-23 LAB — COMPREHENSIVE METABOLIC PANEL
Albumin: 4.5 g/dL (ref 3.5–5.2)
Alkaline Phosphatase: 50 U/L (ref 39–117)
BUN: 14 mg/dL (ref 6–23)
Glucose, Bld: 107 mg/dL — ABNORMAL HIGH (ref 70–99)
Potassium: 3.6 mEq/L (ref 3.5–5.3)
Total Bilirubin: 0.6 mg/dL (ref 0.3–1.2)

## 2009-03-23 LAB — CANCER ANTIGEN 27.29: CA 27.29: 18 U/mL (ref 0–39)

## 2009-03-23 LAB — VITAMIN D 25 HYDROXY (VIT D DEFICIENCY, FRACTURES): Vit D, 25-Hydroxy: 30 ng/mL (ref 30–89)

## 2009-07-28 ENCOUNTER — Encounter: Admission: RE | Admit: 2009-07-28 | Discharge: 2009-07-28 | Payer: Self-pay | Admitting: Oncology

## 2009-09-14 ENCOUNTER — Ambulatory Visit: Payer: Self-pay | Admitting: Oncology

## 2009-11-12 ENCOUNTER — Ambulatory Visit: Payer: Self-pay | Admitting: Oncology

## 2010-01-12 ENCOUNTER — Ambulatory Visit: Payer: Self-pay | Admitting: Oncology

## 2010-05-27 ENCOUNTER — Ambulatory Visit: Payer: Self-pay | Admitting: Oncology

## 2010-05-30 LAB — CBC WITH DIFFERENTIAL/PLATELET
Basophils Absolute: 0 10*3/uL (ref 0.0–0.1)
HGB: 13 g/dL (ref 11.6–15.9)
RBC: 3.8 10*6/uL (ref 3.70–5.45)
RDW: 11.6 % (ref 11.2–14.5)

## 2010-05-31 ENCOUNTER — Ambulatory Visit: Payer: Self-pay | Admitting: Cardiology

## 2010-05-31 ENCOUNTER — Encounter: Payer: Self-pay | Admitting: Oncology

## 2010-05-31 ENCOUNTER — Encounter (HOSPITAL_COMMUNITY): Admission: RE | Admit: 2010-05-31 | Discharge: 2010-07-15 | Payer: Self-pay | Admitting: Oncology

## 2010-05-31 ENCOUNTER — Encounter: Admission: RE | Admit: 2010-05-31 | Discharge: 2010-05-31 | Payer: Self-pay | Admitting: Oncology

## 2010-05-31 ENCOUNTER — Ambulatory Visit: Payer: Self-pay

## 2010-05-31 DIAGNOSIS — C50919 Malignant neoplasm of unspecified site of unspecified female breast: Secondary | ICD-10-CM

## 2010-05-31 HISTORY — DX: Malignant neoplasm of unspecified site of unspecified female breast: C50.919

## 2010-05-31 LAB — COMPREHENSIVE METABOLIC PANEL
ALT: <8 U/L (ref 0–35)
AST: 12 U/L (ref 0–37)
Alkaline Phosphatase: 65 U/L (ref 39–117)
BUN: 15 mg/dL (ref 6–23)
CO2: 24 mEq/L (ref 19–32)
Calcium: 8.7 mg/dL (ref 8.4–10.5)
Creatinine, Ser: 0.7 mg/dL (ref 0.40–1.20)
Potassium: 3.7 mEq/L (ref 3.5–5.3)
Sodium: 140 mEq/L (ref 135–145)
Total Bilirubin: 0.7 mg/dL (ref 0.3–1.2)
Total Protein: 6.6 g/dL (ref 6.0–8.3)

## 2010-05-31 LAB — CANCER ANTIGEN 27.29: CA 27.29: 26 U/mL (ref 0–39)

## 2010-05-31 LAB — LACTATE DEHYDROGENASE: LDH: 171 U/L (ref 94–250)

## 2010-05-31 LAB — VITAMIN D 25 HYDROXY (VIT D DEFICIENCY, FRACTURES): Vit D, 25-Hydroxy: 49 ng/mL (ref 30–89)

## 2010-06-03 ENCOUNTER — Ambulatory Visit (HOSPITAL_COMMUNITY): Admission: RE | Admit: 2010-06-03 | Discharge: 2010-06-03 | Payer: Self-pay | Admitting: Oncology

## 2010-06-03 ENCOUNTER — Encounter: Admission: RE | Admit: 2010-06-03 | Discharge: 2010-06-03 | Payer: Self-pay | Admitting: Oncology

## 2010-06-05 ENCOUNTER — Encounter: Admission: RE | Admit: 2010-06-05 | Discharge: 2010-06-05 | Payer: Self-pay | Admitting: Oncology

## 2010-06-15 ENCOUNTER — Ambulatory Visit (HOSPITAL_BASED_OUTPATIENT_CLINIC_OR_DEPARTMENT_OTHER): Admission: RE | Admit: 2010-06-15 | Discharge: 2010-06-15 | Payer: Self-pay | Admitting: General Surgery

## 2010-06-16 ENCOUNTER — Encounter: Admission: RE | Admit: 2010-06-16 | Discharge: 2010-06-16 | Payer: Self-pay | Admitting: Oncology

## 2010-06-24 LAB — CBC WITH DIFFERENTIAL/PLATELET
EOS%: 2.6 % (ref 0.0–7.0)
Eosinophils Absolute: 0.1 10*3/uL (ref 0.0–0.5)
HGB: 12.8 g/dL (ref 11.6–15.9)
LYMPH%: 40.8 % (ref 14.0–49.7)
MCV: 93.5 fL (ref 79.5–101.0)
NEUT%: 51.5 % (ref 38.4–76.8)
Platelets: 165 10*3/uL (ref 145–400)
RDW: 11.8 % (ref 11.2–14.5)
lymph#: 1.8 10*3/uL (ref 0.9–3.3)

## 2010-06-29 ENCOUNTER — Ambulatory Visit: Payer: Self-pay | Admitting: Oncology

## 2010-07-01 ENCOUNTER — Encounter: Payer: Self-pay | Admitting: Cardiology

## 2010-07-01 LAB — CBC WITH DIFFERENTIAL/PLATELET
HGB: 13.4 g/dL (ref 11.6–15.9)
LYMPH%: 37.8 % (ref 14.0–49.7)
MCH: 33.2 pg (ref 25.1–34.0)
MCHC: 35.6 g/dL (ref 31.5–36.0)
MCV: 93.1 fL (ref 79.5–101.0)
Platelets: 175 10*3/uL (ref 145–400)
RDW: 12 % (ref 11.2–14.5)
WBC: 3.3 10*3/uL — ABNORMAL LOW (ref 3.9–10.3)
lymph#: 1.2 10*3/uL (ref 0.9–3.3)

## 2010-07-15 LAB — CBC WITH DIFFERENTIAL/PLATELET
BASO%: 0.8 % (ref 0.0–2.0)
EOS%: 1.4 % (ref 0.0–7.0)
Eosinophils Absolute: 0.1 10*3/uL (ref 0.0–0.5)
LYMPH%: 36.2 % (ref 14.0–49.7)
MCHC: 35.2 g/dL (ref 31.5–36.0)
MCV: 94.5 fL (ref 79.5–101.0)
Platelets: 152 10*3/uL (ref 145–400)
RDW: 13.2 % (ref 11.2–14.5)
WBC: 3.5 10*3/uL — ABNORMAL LOW (ref 3.9–10.3)
lymph#: 1.3 10*3/uL (ref 0.9–3.3)

## 2010-07-15 LAB — COMPREHENSIVE METABOLIC PANEL
ALT: 15 U/L (ref 0–35)
AST: 18 U/L (ref 0–37)
BUN: 15 mg/dL (ref 6–23)
CO2: 25 mEq/L (ref 19–32)
Calcium: 9.1 mg/dL (ref 8.4–10.5)
Creatinine, Ser: 0.85 mg/dL (ref 0.40–1.20)
Potassium: 3.4 mEq/L — ABNORMAL LOW (ref 3.5–5.3)
Sodium: 141 mEq/L (ref 135–145)

## 2010-07-22 LAB — COMPREHENSIVE METABOLIC PANEL
AST: 15 U/L (ref 0–37)
Albumin: 4 g/dL (ref 3.5–5.2)
Calcium: 8.7 mg/dL (ref 8.4–10.5)
Chloride: 107 mEq/L (ref 96–112)
Creatinine, Ser: 0.72 mg/dL (ref 0.40–1.20)
Glucose, Bld: 110 mg/dL — ABNORMAL HIGH (ref 70–99)
Potassium: 3.9 mEq/L (ref 3.5–5.3)

## 2010-07-22 LAB — CBC WITH DIFFERENTIAL/PLATELET
BASO%: 1.7 % (ref 0.0–2.0)
Basophils Absolute: 0.1 10*3/uL (ref 0.0–0.1)
Eosinophils Absolute: 0.1 10*3/uL (ref 0.0–0.5)
LYMPH%: 42.5 % (ref 14.0–49.7)
MCH: 33.1 pg (ref 25.1–34.0)
MCHC: 35.5 g/dL (ref 31.5–36.0)
MONO#: 0.2 10*3/uL (ref 0.1–0.9)
MONO%: 4.6 % (ref 0.0–14.0)
NEUT#: 1.7 10*3/uL (ref 1.5–6.5)
NEUT%: 49.2 % (ref 38.4–76.8)
Platelets: 165 10*3/uL (ref 145–400)
RBC: 4.05 10*6/uL (ref 3.70–5.45)
WBC: 3.5 10*3/uL — ABNORMAL LOW (ref 3.9–10.3)
lymph#: 1.5 10*3/uL (ref 0.9–3.3)

## 2010-07-29 ENCOUNTER — Ambulatory Visit: Payer: Self-pay | Admitting: Oncology

## 2010-07-29 LAB — CBC WITH DIFFERENTIAL/PLATELET
EOS%: 1.7 % (ref 0.0–7.0)
Eosinophils Absolute: 0.1 10*3/uL (ref 0.0–0.5)
HCT: 34.9 % (ref 34.8–46.6)
HGB: 12.5 g/dL (ref 11.6–15.9)
MCH: 33.4 pg (ref 25.1–34.0)
MONO#: 0.2 10*3/uL (ref 0.1–0.9)
NEUT#: 1.4 10*3/uL — ABNORMAL LOW (ref 1.5–6.5)
RDW: 13 % (ref 11.2–14.5)
WBC: 2.9 10*3/uL — ABNORMAL LOW (ref 3.9–10.3)
lymph#: 1.3 10*3/uL (ref 0.9–3.3)

## 2010-08-05 LAB — CBC WITH DIFFERENTIAL/PLATELET
Eosinophils Absolute: 0 10*3/uL (ref 0.0–0.5)
HGB: 12.1 g/dL (ref 11.6–15.9)
LYMPH%: 40.6 % (ref 14.0–49.7)
MCH: 33.2 pg (ref 25.1–34.0)
MONO%: 4.9 % (ref 0.0–14.0)
NEUT#: 1.5 10*3/uL (ref 1.5–6.5)
NEUT%: 52.8 % (ref 38.4–76.8)
Platelets: 190 10*3/uL (ref 145–400)
WBC: 2.9 10*3/uL — ABNORMAL LOW (ref 3.9–10.3)

## 2010-08-12 LAB — CBC WITH DIFFERENTIAL/PLATELET
BASO%: 0.5 % (ref 0.0–2.0)
Basophils Absolute: 0 10*3/uL (ref 0.0–0.1)
EOS%: 1.3 % (ref 0.0–7.0)
Eosinophils Absolute: 0.1 10*3/uL (ref 0.0–0.5)
HGB: 13 g/dL (ref 11.6–15.9)
LYMPH%: 40 % (ref 14.0–49.7)
MCHC: 35.5 g/dL (ref 31.5–36.0)
MONO#: 0.4 10*3/uL (ref 0.1–0.9)
MONO%: 10 % (ref 0.0–14.0)
NEUT%: 48.2 % (ref 38.4–76.8)
Platelets: 178 10*3/uL (ref 145–400)
RBC: 3.89 10*6/uL (ref 3.70–5.45)
WBC: 3.8 10*3/uL — ABNORMAL LOW (ref 3.9–10.3)

## 2010-08-19 LAB — CBC WITH DIFFERENTIAL/PLATELET
BASO%: 1.3 % (ref 0.0–2.0)
Basophils Absolute: 0.1 10*3/uL (ref 0.0–0.1)
EOS%: 2.6 % (ref 0.0–7.0)
Eosinophils Absolute: 0.1 10*3/uL (ref 0.0–0.5)
HCT: 35.8 % (ref 34.8–46.6)
HGB: 12.8 g/dL (ref 11.6–15.9)
LYMPH%: 37 % (ref 14.0–49.7)
MCH: 33.8 pg (ref 25.1–34.0)
MCHC: 35.8 g/dL (ref 31.5–36.0)
MCV: 94.5 fL (ref 79.5–101.0)
MONO#: 0.2 10*3/uL (ref 0.1–0.9)
MONO%: 4.7 % (ref 0.0–14.0)
NEUT#: 2.1 10*3/uL (ref 1.5–6.5)
NEUT%: 54.4 % (ref 38.4–76.8)
Platelets: 168 10*3/uL (ref 145–400)
RBC: 3.79 10*6/uL (ref 3.70–5.45)
RDW: 13.9 % (ref 11.2–14.5)
WBC: 3.8 10*3/uL — ABNORMAL LOW (ref 3.9–10.3)
lymph#: 1.4 10*3/uL (ref 0.9–3.3)

## 2010-08-19 LAB — COMPREHENSIVE METABOLIC PANEL
ALT: 16 U/L (ref 0–35)
AST: 15 U/L (ref 0–37)
Albumin: 4.1 g/dL (ref 3.5–5.2)
Alkaline Phosphatase: 48 U/L (ref 39–117)
CO2: 21 mEq/L (ref 19–32)
Calcium: 9.3 mg/dL (ref 8.4–10.5)

## 2010-08-23 ENCOUNTER — Ambulatory Visit
Admission: RE | Admit: 2010-08-23 | Discharge: 2010-08-23 | Payer: Self-pay | Source: Home / Self Care | Admitting: Oncology

## 2010-08-23 ENCOUNTER — Encounter: Payer: Self-pay | Admitting: Oncology

## 2010-08-23 ENCOUNTER — Ambulatory Visit: Payer: Self-pay | Admitting: Cardiovascular Disease

## 2010-08-26 LAB — CBC WITH DIFFERENTIAL/PLATELET
BASO%: 1.3 % (ref 0.0–2.0)
EOS%: 2.1 % (ref 0.0–7.0)
HCT: 35.6 % (ref 34.8–46.6)
MCH: 33.4 pg (ref 25.1–34.0)
MCV: 94.4 fL (ref 79.5–101.0)
MONO%: 5.6 % (ref 0.0–14.0)
RBC: 3.77 10*6/uL (ref 3.70–5.45)
nRBC: 0 % (ref 0–0)

## 2010-08-30 ENCOUNTER — Ambulatory Visit: Payer: Self-pay | Admitting: Oncology

## 2010-09-01 LAB — CBC WITH DIFFERENTIAL/PLATELET
Basophils Absolute: 0 10*3/uL (ref 0.0–0.1)
EOS%: 1.1 % (ref 0.0–7.0)
Eosinophils Absolute: 0 10*3/uL (ref 0.0–0.5)
HGB: 12.4 g/dL (ref 11.6–15.9)
LYMPH%: 44.3 % (ref 14.0–49.7)
MCH: 33.7 pg (ref 25.1–34.0)
MCV: 94.3 fL (ref 79.5–101.0)
MONO%: 6.1 % (ref 0.0–14.0)
Platelets: 183 10*3/uL (ref 145–400)
RBC: 3.68 10*6/uL — ABNORMAL LOW (ref 3.70–5.45)
RDW: 14.4 % (ref 11.2–14.5)

## 2010-09-09 LAB — COMPREHENSIVE METABOLIC PANEL
ALT: 15 U/L (ref 0–35)
AST: 19 U/L (ref 0–37)
Alkaline Phosphatase: 52 U/L (ref 39–117)
BUN: 13 mg/dL (ref 6–23)
Calcium: 9.1 mg/dL (ref 8.4–10.5)
Chloride: 104 mEq/L (ref 96–112)
Creatinine, Ser: 0.73 mg/dL (ref 0.40–1.20)
Total Bilirubin: 0.6 mg/dL (ref 0.3–1.2)

## 2010-09-09 LAB — CBC WITH DIFFERENTIAL/PLATELET
Basophils Absolute: 0 10*3/uL (ref 0.0–0.1)
Eosinophils Absolute: 0 10*3/uL (ref 0.0–0.5)
HCT: 36.4 % (ref 34.8–46.6)
HGB: 12.8 g/dL (ref 11.6–15.9)
LYMPH%: 34.3 % (ref 14.0–49.7)
MONO#: 0.4 10*3/uL (ref 0.1–0.9)
NEUT#: 1.5 10*3/uL (ref 1.5–6.5)
NEUT%: 51.4 % (ref 38.4–76.8)
Platelets: 184 10*3/uL (ref 145–400)
WBC: 3 10*3/uL — ABNORMAL LOW (ref 3.9–10.3)
lymph#: 1 10*3/uL (ref 0.9–3.3)

## 2010-09-16 LAB — CBC WITH DIFFERENTIAL/PLATELET
BASO%: 0.6 % (ref 0.0–2.0)
Basophils Absolute: 0 10*3/uL (ref 0.0–0.1)
EOS%: 1.4 % (ref 0.0–7.0)
HCT: 34.7 % — ABNORMAL LOW (ref 34.8–46.6)
HGB: 12.3 g/dL (ref 11.6–15.9)
LYMPH%: 32.7 % (ref 14.0–49.7)
MCH: 33.7 pg (ref 25.1–34.0)
MCHC: 35.4 g/dL (ref 31.5–36.0)
NEUT%: 61.2 % (ref 38.4–76.8)
Platelets: 182 10*3/uL (ref 145–400)

## 2010-09-19 ENCOUNTER — Encounter
Admission: RE | Admit: 2010-09-19 | Discharge: 2010-10-13 | Payer: Self-pay | Source: Home / Self Care | Attending: Oncology | Admitting: Oncology

## 2010-09-23 LAB — CBC WITH DIFFERENTIAL/PLATELET
BASO%: 1.3 % (ref 0.0–2.0)
Eosinophils Absolute: 0.1 10*3/uL (ref 0.0–0.5)
HGB: 11.6 g/dL (ref 11.6–15.9)
LYMPH%: 44.2 % (ref 14.0–49.7)
MCH: 33.6 pg (ref 25.1–34.0)
MCHC: 34.7 g/dL (ref 31.5–36.0)
MCV: 96.8 fL (ref 79.5–101.0)
MONO#: 0.1 10*3/uL (ref 0.1–0.9)
NEUT%: 50 % (ref 38.4–76.8)
Platelets: 182 10*3/uL (ref 145–400)
RDW: 13.9 % (ref 11.2–14.5)
WBC: 3.1 10*3/uL — ABNORMAL LOW (ref 3.9–10.3)
lymph#: 1.4 10*3/uL (ref 0.9–3.3)

## 2010-09-30 ENCOUNTER — Ambulatory Visit: Payer: Self-pay | Admitting: Oncology

## 2010-09-30 LAB — CBC WITH DIFFERENTIAL/PLATELET
BASO%: 1.1 % (ref 0.0–2.0)
Basophils Absolute: 0 10*3/uL (ref 0.0–0.1)
HCT: 34.1 % — ABNORMAL LOW (ref 34.8–46.6)
MONO#: 0.2 10*3/uL (ref 0.1–0.9)
MONO%: 5.7 % (ref 0.0–14.0)
Platelets: 218 10*3/uL (ref 145–400)

## 2010-10-03 ENCOUNTER — Ambulatory Visit (HOSPITAL_COMMUNITY)
Admission: RE | Admit: 2010-10-03 | Discharge: 2010-10-03 | Payer: Self-pay | Source: Home / Self Care | Attending: Oncology | Admitting: Oncology

## 2010-10-06 LAB — CBC WITH DIFFERENTIAL/PLATELET
Basophils Absolute: 0 10*3/uL (ref 0.0–0.1)
EOS%: 1 % (ref 0.0–7.0)
Eosinophils Absolute: 0 10*3/uL (ref 0.0–0.5)
LYMPH%: 29.7 % (ref 14.0–49.7)
MCV: 96.6 fL (ref 79.5–101.0)
NEUT#: 2.4 10*3/uL (ref 1.5–6.5)
NEUT%: 60.9 % (ref 38.4–76.8)
Platelets: 174 10*3/uL (ref 145–400)
RBC: 3.78 10*6/uL (ref 3.70–5.45)
RDW: 14 % (ref 11.2–14.5)
WBC: 3.9 10*3/uL (ref 3.9–10.3)
lymph#: 1.2 10*3/uL (ref 0.9–3.3)
nRBC: 0 % (ref 0–0)

## 2010-10-06 LAB — COMPREHENSIVE METABOLIC PANEL
ALT: 16 U/L (ref 0–35)
CO2: 26 mEq/L (ref 19–32)
Calcium: 9 mg/dL (ref 8.4–10.5)
Chloride: 106 mEq/L (ref 96–112)
Total Bilirubin: 0.4 mg/dL (ref 0.3–1.2)

## 2010-10-06 LAB — CANCER ANTIGEN 27.29: CA 27.29: 28 U/mL (ref 0–39)

## 2010-10-14 LAB — CBC WITH DIFFERENTIAL/PLATELET
Basophils Absolute: 0 10*3/uL (ref 0.0–0.1)
HGB: 12.7 g/dL (ref 11.6–15.9)
LYMPH%: 27.2 % (ref 14.0–49.7)
MCHC: 35.4 g/dL (ref 31.5–36.0)
MCV: 96.5 fL (ref 79.5–101.0)
NEUT%: 66.8 % (ref 38.4–76.8)
RBC: 3.72 10*6/uL (ref 3.70–5.45)
RDW: 13.1 % (ref 11.2–14.5)
lymph#: 1.5 10*3/uL (ref 0.9–3.3)

## 2010-10-21 LAB — CBC WITH DIFFERENTIAL/PLATELET
BASO%: 1.2 % (ref 0.0–2.0)
EOS%: 2.3 % (ref 0.0–7.0)
Eosinophils Absolute: 0.1 10*3/uL (ref 0.0–0.5)
HCT: 35.6 % (ref 34.8–46.6)
MCH: 34.4 pg — ABNORMAL HIGH (ref 25.1–34.0)
MCHC: 35.7 g/dL (ref 31.5–36.0)
MONO%: 5.6 % (ref 0.0–14.0)
NEUT#: 1.7 10*3/uL (ref 1.5–6.5)
NEUT%: 50.8 % (ref 38.4–76.8)
WBC: 3.4 10*3/uL — ABNORMAL LOW (ref 3.9–10.3)
lymph#: 1.4 10*3/uL (ref 0.9–3.3)
nRBC: 0 % (ref 0–0)

## 2010-10-28 LAB — CBC WITH DIFFERENTIAL/PLATELET
BASO%: 0.6 % (ref 0.0–2.0)
Basophils Absolute: 0 10*3/uL (ref 0.0–0.1)
EOS%: 1.5 % (ref 0.0–7.0)
Eosinophils Absolute: 0.1 10*3/uL (ref 0.0–0.5)
HCT: 36.1 % (ref 34.8–46.6)
HGB: 12.9 g/dL (ref 11.6–15.9)
LYMPH%: 37 % (ref 14.0–49.7)
MCH: 34.1 pg — ABNORMAL HIGH (ref 25.1–34.0)
MCHC: 35.7 g/dL (ref 31.5–36.0)
MCV: 95.5 fL (ref 79.5–101.0)
MONO#: 0.2 10*3/uL (ref 0.1–0.9)
MONO%: 4.8 % (ref 0.0–14.0)
NEUT#: 1.9 10*3/uL (ref 1.5–6.5)
NEUT%: 56.1 % (ref 38.4–76.8)
Platelets: 197 10*3/uL (ref 145–400)
RBC: 3.78 10*6/uL (ref 3.70–5.45)
RDW: 13.3 % (ref 11.2–14.5)
WBC: 3.3 10*3/uL — ABNORMAL LOW (ref 3.9–10.3)
lymph#: 1.2 10*3/uL (ref 0.9–3.3)

## 2010-11-02 ENCOUNTER — Ambulatory Visit (HOSPITAL_BASED_OUTPATIENT_CLINIC_OR_DEPARTMENT_OTHER): Payer: PRIVATE HEALTH INSURANCE | Admitting: Oncology

## 2010-11-04 LAB — CBC WITH DIFFERENTIAL/PLATELET
BASO%: 1.1 % (ref 0.0–2.0)
Basophils Absolute: 0 10*3/uL (ref 0.0–0.1)
EOS%: 0.8 % (ref 0.0–7.0)
Eosinophils Absolute: 0 10*3/uL (ref 0.0–0.5)
HCT: 38.6 % (ref 34.8–46.6)
HGB: 13.6 g/dL (ref 11.6–15.9)
LYMPH%: 31.4 % (ref 14.0–49.7)
MCH: 35 pg — ABNORMAL HIGH (ref 25.1–34.0)
MCHC: 35.3 g/dL (ref 31.5–36.0)
MCV: 99.2 fL (ref 79.5–101.0)
MONO#: 0.3 10*3/uL (ref 0.1–0.9)
MONO%: 8.4 % (ref 0.0–14.0)
NEUT#: 2.1 10*3/uL (ref 1.5–6.5)
NEUT%: 58.3 % (ref 38.4–76.8)
Platelets: 195 10*3/uL (ref 145–400)
RBC: 3.9 10*6/uL (ref 3.70–5.45)
RDW: 14.5 % (ref 11.2–14.5)
WBC: 3.6 10*3/uL — ABNORMAL LOW (ref 3.9–10.3)
lymph#: 1.1 10*3/uL (ref 0.9–3.3)

## 2010-11-04 LAB — COMPREHENSIVE METABOLIC PANEL
ALT: 15 U/L (ref 0–35)
AST: 20 U/L (ref 0–37)
Albumin: 4.5 g/dL (ref 3.5–5.2)
Alkaline Phosphatase: 55 U/L (ref 39–117)
BUN: 18 mg/dL (ref 6–23)
CO2: 24 mEq/L (ref 19–32)
Calcium: 9.4 mg/dL (ref 8.4–10.5)
Chloride: 104 mEq/L (ref 96–112)
Creatinine, Ser: 0.81 mg/dL (ref 0.40–1.20)
Glucose, Bld: 107 mg/dL — ABNORMAL HIGH (ref 70–99)
Potassium: 4.1 mEq/L (ref 3.5–5.3)
Sodium: 141 mEq/L (ref 135–145)
Total Bilirubin: 0.4 mg/dL (ref 0.3–1.2)
Total Protein: 6.5 g/dL (ref 6.0–8.3)

## 2010-11-11 LAB — CBC WITH DIFFERENTIAL/PLATELET
BASO%: 0.8 % (ref 0.0–2.0)
Basophils Absolute: 0 10*3/uL (ref 0.0–0.1)
EOS%: 1.2 % (ref 0.0–7.0)
Eosinophils Absolute: 0.1 10*3/uL (ref 0.0–0.5)
HCT: 34.8 % (ref 34.8–46.6)
HGB: 12.3 g/dL (ref 11.6–15.9)
LYMPH%: 31 % (ref 14.0–49.7)
MCH: 33.8 pg (ref 25.1–34.0)
MCHC: 35.3 g/dL (ref 31.5–36.0)
MCV: 95.6 fL (ref 79.5–101.0)
MONO#: 0.2 10*3/uL (ref 0.1–0.9)
MONO%: 3.5 % (ref 0.0–14.0)
NEUT#: 3.3 10*3/uL (ref 1.5–6.5)
NEUT%: 63.5 % (ref 38.4–76.8)
Platelets: 158 10*3/uL (ref 145–400)
RBC: 3.64 10*6/uL — ABNORMAL LOW (ref 3.70–5.45)
RDW: 13.1 % (ref 11.2–14.5)
WBC: 5.2 10*3/uL (ref 3.9–10.3)
lymph#: 1.6 10*3/uL (ref 0.9–3.3)

## 2010-11-13 ENCOUNTER — Encounter: Payer: Self-pay | Admitting: Obstetrics and Gynecology

## 2010-11-13 ENCOUNTER — Encounter: Payer: Self-pay | Admitting: Oncology

## 2010-11-18 LAB — CBC WITH DIFFERENTIAL/PLATELET
Basophils Absolute: 0 10*3/uL (ref 0.0–0.1)
HCT: 33.8 % — ABNORMAL LOW (ref 34.8–46.6)
MONO%: 4.4 % (ref 0.0–14.0)
NEUT#: 1.6 10*3/uL (ref 1.5–6.5)
NEUT%: 50.2 % (ref 38.4–76.8)
Platelets: 178 10*3/uL (ref 145–400)

## 2010-11-18 LAB — COMPREHENSIVE METABOLIC PANEL
ALT: 10 U/L (ref 0–35)
Alkaline Phosphatase: 50 U/L (ref 39–117)
BUN: 17 mg/dL (ref 6–23)
CO2: 27 mEq/L (ref 19–32)
Calcium: 9 mg/dL (ref 8.4–10.5)
Chloride: 106 mEq/L (ref 96–112)
Potassium: 4 mEq/L (ref 3.5–5.3)
Total Protein: 5.8 g/dL — ABNORMAL LOW (ref 6.0–8.3)

## 2010-11-25 ENCOUNTER — Ambulatory Visit (HOSPITAL_BASED_OUTPATIENT_CLINIC_OR_DEPARTMENT_OTHER): Payer: PRIVATE HEALTH INSURANCE | Admitting: Oncology

## 2010-11-25 DIAGNOSIS — C50919 Malignant neoplasm of unspecified site of unspecified female breast: Secondary | ICD-10-CM

## 2010-11-25 DIAGNOSIS — Z5112 Encounter for antineoplastic immunotherapy: Secondary | ICD-10-CM

## 2010-11-25 DIAGNOSIS — Z5111 Encounter for antineoplastic chemotherapy: Secondary | ICD-10-CM

## 2010-11-25 DIAGNOSIS — C787 Secondary malignant neoplasm of liver and intrahepatic bile duct: Secondary | ICD-10-CM

## 2010-11-25 LAB — CBC WITH DIFFERENTIAL/PLATELET
BASO%: 0.7 % (ref 0.0–2.0)
Basophils Absolute: 0 10*3/uL (ref 0.0–0.1)
EOS%: 1.6 % (ref 0.0–7.0)
HCT: 32.8 % — ABNORMAL LOW (ref 34.8–46.6)
HGB: 11.5 g/dL — ABNORMAL LOW (ref 11.6–15.9)
LYMPH%: 36.2 % (ref 14.0–49.7)
MCHC: 35.1 g/dL (ref 31.5–36.0)
MONO#: 0.2 10*3/uL (ref 0.1–0.9)
RBC: 3.42 10*6/uL — ABNORMAL LOW (ref 3.70–5.45)
RDW: 13.6 % (ref 11.2–14.5)

## 2010-11-29 ENCOUNTER — Ambulatory Visit (HOSPITAL_COMMUNITY)
Admission: RE | Admit: 2010-11-29 | Discharge: 2010-11-29 | Disposition: A | Discharge status: Home / Self Care | Payer: PRIVATE HEALTH INSURANCE | Source: Ambulatory Visit | Attending: Oncology | Admitting: Oncology

## 2010-11-29 DIAGNOSIS — Z09 Encounter for follow-up examination after completed treatment for conditions other than malignant neoplasm: Secondary | ICD-10-CM | POA: Insufficient documentation

## 2010-11-29 DIAGNOSIS — I359 Nonrheumatic aortic valve disorder, unspecified: Secondary | ICD-10-CM | POA: Insufficient documentation

## 2010-11-29 DIAGNOSIS — C50919 Malignant neoplasm of unspecified site of unspecified female breast: Secondary | ICD-10-CM | POA: Insufficient documentation

## 2010-12-16 ENCOUNTER — Other Ambulatory Visit: Payer: Self-pay | Admitting: Physician Assistant

## 2010-12-16 ENCOUNTER — Encounter (HOSPITAL_BASED_OUTPATIENT_CLINIC_OR_DEPARTMENT_OTHER): Payer: PRIVATE HEALTH INSURANCE | Admitting: Oncology

## 2010-12-16 DIAGNOSIS — C787 Secondary malignant neoplasm of liver and intrahepatic bile duct: Secondary | ICD-10-CM

## 2010-12-16 DIAGNOSIS — Z171 Estrogen receptor negative status [ER-]: Secondary | ICD-10-CM

## 2010-12-16 DIAGNOSIS — Z5111 Encounter for antineoplastic chemotherapy: Secondary | ICD-10-CM

## 2010-12-16 DIAGNOSIS — Z5112 Encounter for antineoplastic immunotherapy: Secondary | ICD-10-CM

## 2010-12-16 DIAGNOSIS — C50919 Malignant neoplasm of unspecified site of unspecified female breast: Secondary | ICD-10-CM

## 2010-12-16 LAB — COMPREHENSIVE METABOLIC PANEL
ALT: 13 U/L (ref 0–35)
AST: 17 U/L (ref 0–37)
Alkaline Phosphatase: 58 U/L (ref 39–117)
CO2: 27 mEq/L (ref 19–32)
Creatinine, Ser: 0.77 mg/dL (ref 0.40–1.20)
Sodium: 141 mEq/L (ref 135–145)
Total Bilirubin: 0.4 mg/dL (ref 0.3–1.2)
Total Protein: 5.8 g/dL — ABNORMAL LOW (ref 6.0–8.3)

## 2010-12-16 LAB — CBC WITH DIFFERENTIAL/PLATELET
BASO%: 1 % (ref 0.0–2.0)
EOS%: 2 % (ref 0.0–7.0)
HCT: 38.1 % (ref 34.8–46.6)
LYMPH%: 28.4 % (ref 14.0–49.7)
MCH: 33.3 pg (ref 25.1–34.0)
MCHC: 35.2 g/dL (ref 31.5–36.0)
MCV: 94.5 fL (ref 79.5–101.0)
MONO#: 0.3 10*3/uL (ref 0.1–0.9)
MONO%: 6.7 % (ref 0.0–14.0)
NEUT%: 61.9 % (ref 38.4–76.8)
Platelets: 183 10*3/uL (ref 145–400)
RBC: 4.03 10*6/uL (ref 3.70–5.45)
WBC: 4.9 10*3/uL (ref 3.9–10.3)

## 2011-01-03 LAB — GLUCOSE, CAPILLARY: Glucose-Capillary: 105 mg/dL — ABNORMAL HIGH (ref 70–99)

## 2011-01-06 ENCOUNTER — Other Ambulatory Visit: Payer: Self-pay | Admitting: Physician Assistant

## 2011-01-06 ENCOUNTER — Encounter (HOSPITAL_BASED_OUTPATIENT_CLINIC_OR_DEPARTMENT_OTHER): Payer: PRIVATE HEALTH INSURANCE | Admitting: Oncology

## 2011-01-06 DIAGNOSIS — Z171 Estrogen receptor negative status [ER-]: Secondary | ICD-10-CM

## 2011-01-06 DIAGNOSIS — C50919 Malignant neoplasm of unspecified site of unspecified female breast: Secondary | ICD-10-CM

## 2011-01-06 DIAGNOSIS — Z5112 Encounter for antineoplastic immunotherapy: Secondary | ICD-10-CM

## 2011-01-06 LAB — CBC WITH DIFFERENTIAL/PLATELET
Basophils Absolute: 0 10*3/uL (ref 0.0–0.1)
Eosinophils Absolute: 0.1 10*3/uL (ref 0.0–0.5)
HCT: 39.3 % (ref 34.8–46.6)
HGB: 13.9 g/dL (ref 11.6–15.9)
MCH: 32.9 pg (ref 25.1–34.0)
MCV: 92.9 fL (ref 79.5–101.0)
MONO%: 5.4 % (ref 0.0–14.0)
NEUT#: 3.4 10*3/uL (ref 1.5–6.5)
NEUT%: 64.2 % (ref 38.4–76.8)
RDW: 12.3 % (ref 11.2–14.5)

## 2011-01-27 ENCOUNTER — Other Ambulatory Visit: Payer: Self-pay | Admitting: Physician Assistant

## 2011-01-27 ENCOUNTER — Encounter (HOSPITAL_BASED_OUTPATIENT_CLINIC_OR_DEPARTMENT_OTHER): Payer: PRIVATE HEALTH INSURANCE | Admitting: Oncology

## 2011-01-27 DIAGNOSIS — Z171 Estrogen receptor negative status [ER-]: Secondary | ICD-10-CM

## 2011-01-27 DIAGNOSIS — Z5112 Encounter for antineoplastic immunotherapy: Secondary | ICD-10-CM

## 2011-01-27 DIAGNOSIS — C787 Secondary malignant neoplasm of liver and intrahepatic bile duct: Secondary | ICD-10-CM

## 2011-01-27 DIAGNOSIS — C50919 Malignant neoplasm of unspecified site of unspecified female breast: Secondary | ICD-10-CM

## 2011-01-27 LAB — CBC WITH DIFFERENTIAL/PLATELET
Basophils Absolute: 0 10*3/uL (ref 0.0–0.1)
Eosinophils Absolute: 0.1 10*3/uL (ref 0.0–0.5)
HGB: 14 g/dL (ref 11.6–15.9)
LYMPH%: 41 % (ref 14.0–49.7)
MCV: 91.2 fL (ref 79.5–101.0)
MONO#: 0.4 10*3/uL (ref 0.1–0.9)
MONO%: 9.8 % (ref 0.0–14.0)
NEUT#: 1.9 10*3/uL (ref 1.5–6.5)
Platelets: 160 10*3/uL (ref 145–400)
RDW: 11.9 % (ref 11.2–14.5)

## 2011-01-27 LAB — COMPREHENSIVE METABOLIC PANEL
Albumin: 4 g/dL (ref 3.5–5.2)
Alkaline Phosphatase: 56 U/L (ref 39–117)
BUN: 17 mg/dL (ref 6–23)
CO2: 26 mEq/L (ref 19–32)
Glucose, Bld: 89 mg/dL (ref 70–99)
Potassium: 3.9 mEq/L (ref 3.5–5.3)

## 2011-01-27 LAB — CANCER ANTIGEN 27.29: CA 27.29: 23 U/mL (ref 0–39)

## 2011-01-27 LAB — BRAIN NATRIURETIC PEPTIDE: Brain Natriuretic Peptide: 4.2 pg/mL (ref 0.0–100.0)

## 2011-01-27 LAB — LACTATE DEHYDROGENASE: LDH: 159 U/L (ref 94–250)

## 2011-02-06 LAB — CBC
Hemoglobin: 13.8 g/dL (ref 12.0–15.0)
MCHC: 34.3 g/dL (ref 30.0–36.0)
Platelets: 171 10*3/uL (ref 150–400)
RDW: 12.3 % (ref 11.5–15.5)

## 2011-02-17 ENCOUNTER — Encounter (HOSPITAL_BASED_OUTPATIENT_CLINIC_OR_DEPARTMENT_OTHER): Payer: PRIVATE HEALTH INSURANCE | Admitting: Oncology

## 2011-02-17 ENCOUNTER — Other Ambulatory Visit: Payer: Self-pay | Admitting: Physician Assistant

## 2011-02-17 DIAGNOSIS — C787 Secondary malignant neoplasm of liver and intrahepatic bile duct: Secondary | ICD-10-CM

## 2011-02-17 DIAGNOSIS — Z171 Estrogen receptor negative status [ER-]: Secondary | ICD-10-CM

## 2011-02-17 DIAGNOSIS — C50919 Malignant neoplasm of unspecified site of unspecified female breast: Secondary | ICD-10-CM

## 2011-02-17 DIAGNOSIS — Z5112 Encounter for antineoplastic immunotherapy: Secondary | ICD-10-CM

## 2011-02-17 LAB — CBC WITH DIFFERENTIAL/PLATELET
Basophils Absolute: 0 10*3/uL (ref 0.0–0.1)
Eosinophils Absolute: 0.1 10*3/uL (ref 0.0–0.5)
HGB: 13.9 g/dL (ref 11.6–15.9)
MCV: 90.6 fL (ref 79.5–101.0)
MONO#: 0.3 10*3/uL (ref 0.1–0.9)
NEUT#: 2.6 10*3/uL (ref 1.5–6.5)
RBC: 4.34 10*6/uL (ref 3.70–5.45)
RDW: 11.9 % (ref 11.2–14.5)
WBC: 4.6 10*3/uL (ref 3.9–10.3)
lymph#: 1.6 10*3/uL (ref 0.9–3.3)
nRBC: 0 % (ref 0–0)

## 2011-02-21 ENCOUNTER — Ambulatory Visit (HOSPITAL_COMMUNITY)
Admission: RE | Admit: 2011-02-21 | Discharge: 2011-02-21 | Disposition: A | Discharge status: Home / Self Care | Payer: PRIVATE HEALTH INSURANCE | Source: Ambulatory Visit | Attending: Oncology | Admitting: Oncology

## 2011-02-21 DIAGNOSIS — Z79899 Other long term (current) drug therapy: Secondary | ICD-10-CM | POA: Insufficient documentation

## 2011-03-07 NOTE — Op Note (Signed)
[name], [name]               ACCOUNT NO.:  [id number]   MEDICAL RECORD NO.:  [id number]          PATIENT TYPE:  AMB   LOCATION:  DSC                          FACILITY:  [facility]   PHYSICIAN:  Ollen Gross. Vernell Morgans, M.D. DATE OF BIRTH:  [birth year]   DATE OF PROCEDURE:  09/06/2007  DATE OF DISCHARGE:  08/20/2007                               OPERATIVE REPORT   PREOPERATIVE DIAGNOSIS:  Left breast cancer.   POSTOPERATIVE DIAGNOSIS:  Left breast cancer.   PROCEDURE:  Left sentinel lymph node biopsy with injection of blue dye,  placement of right subclavian vein Port-A-Cath.   SURGEON:  Ollen Gross. Vernell Morgans, M.D.   ANESTHESIA:  General via LMA.   PROCEDURE IN DETAIL:  After informed consent was obtained, the patient  was brought to the operating room and placed in the supine position on  the operating table.  After adequate induction of general anesthesia,  the patient's left breast and axilla were prepped with Betadine and  draped in the usual sterile manner.  Earlier in the day, the patient had  undergone injection of 1 mCi of technetium sulfur colloid in the  subareolar position.  We then injected 2 mL of methylene blue and 3 mL  of injectable saline also into the subareolar position and massaged the  breast for approximately five minutes. The NeoProbe device was used to  identify an area of increased radioactivity in the right axilla.  A  small transverse incision was made overlying this area with a 15 blade  knife. This incision was carried down through the skin and subcutaneous  tissue sharply with electrocautery until the axilla was entered. Using  the NeoProbe to direct the dissection, blunt dissection was then carried  out in the axilla with a hemostat and DeBakey until a blue lymph node  was identified. The lymphatics were clamped with hemostats, divided and  ligated with 3-0 Vicryl ties.  The blue sentinel node was then removed  from the patient.  Ex vivo counts on the sentinel  node were  approximately 280. It was sent as sentinel node number 1.  No other  areas of increased radioactivity and no other palpable adenopathy were  appreciated.  The deep layer of this wound was then closed with  interrupted 3-0 Vicryl stitches and the skin was closed with a running 4-  0 Monocryl subcuticular stitch.  A Dermabond dressing was then applied.   At this point, the drapes were removed, the patient's right chest was  then prepped with Betadine and draped in the usual sterile manner. The  area lateral to the bend of the clavicle on the right was then  infiltrated 0.25% Marcaine.  A small incision was made just lateral to  the bend of the clavicle with a 15 blade knife. The patient was placed  in Trendelenburg position. The large bore finder needle from the Port-A-  Cath kit was then used to slide behind the bend of the clavicle aiming  towards the sternal notch and the right subclavian vein was able to be  accessed this way without difficulty.  A  wire was fed through the needle  using the Seldinger technique without difficulty and the needle was then  removed.  Placement of the wire into the central venous system was  confirmed using fluoroscopy.   At this point, the incision on the right chest wall was extended  medially and laterally.  A subcutaneous pocket was created by a  combination of blunt finger dissection and some sharp dissection with  electrocautery. The pocket was created just anterior to the pectoralis  fascia.  The tubing was then attached to the well of the port.  The port  was placed in the pocket and the length of the tubing was estimated  using fluoroscopy and then cut at just over 16 cm. The anchor was left  on the tubing but was not attached to the port well.  Next, a sheath and  dilator were then placed over the wire using the Seldinger technique  without difficulty.  The wire and dilator were then removed from the  patient and a thumb was placed  over the opening in the sheath. The  tubing was then fed through the sheath as far as it could be fed and  held in place while the sheath was gently cracked and separate, keeping  the tubing in place. Intraoperative fluoroscopy, at this point, showed  that the tip of the port was in the superior vena cava by the  radiologist reading. At this point the anchor was then attached to the  port.  The port was then flushed with initially a dilute heparin  solution and then with a more concentrated heparin solution and there  was good blood return.  The port well was then anchored to the  pectoralis fascia with two 4-0 Prolene stitches. The subcutaneous tissue  was then closed over the port with interrupted 3-0 Vicryl stitches and  the skin was closed with a running 4-0 Monocryl subcuticular stitch.  A  Dermabond dressing was applied and the patient tolerated the procedure  well.  At the end of the case, all needle, sponge, and instrument counts  were correct.  The patient was then awakened and taken to recovery in  stable condition.      Ollen Gross. Vernell Morgans, M.D.  Electronically Signed     PST/MEDQ  D:  09/06/2007  T:  09/07/2007  Job:  [id number]

## 2011-03-07 NOTE — Op Note (Signed)
NAMERUEY, STORER               ACCOUNT NO.:  [id number]   MEDICAL RECORD NO.:  [id number]          PATIENT TYPE:  AMB   LOCATION:  SDC                           FACILITY:  [facility]   PHYSICIAN:  Randye Lobo, M.D.   DATE OF BIRTH:  19-Dec-1965   DATE OF PROCEDURE:  03/10/2008  DATE OF DISCHARGE:                               OPERATIVE REPORT   PREOPERATIVE DIAGNOSIS:  Multiparous female, desires permanent  sterilization.   POSTOPERATIVE DIAGNOSES:  Multiparous female, desires permanent  sterilization, and right ovarian mass.   PROCEDURES:  Laparoscopic bilateral tubal ligation with fulguration,  right ovarian biopsy.   SURGEON:  Randye Lobo, MD   ANESTHESIA:  General endotracheal.   IV FLUIDS:  1000 mL of Ringer's lactate.   ESTIMATED BLOOD LOSS:  Minimal.   URINE OUTPUT:  200 mL.   COMPLICATIONS:  None.   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  The patient is currently undergoing  therapy for her breast cancer.  She has done pre-excisional  chemotherapy.  She is status post lumpectomy, and she is currently  undergoing radiation therapy with a plan for Herceptin treatment.  The  patient does desire permanent sterilization.  There is no family history  of breast, ovarian, uterine, or colon cancer, and a plan is now made to  proceed with a laparoscopic bilateral tubal ligation after risks,  benefits, and alternatives are reviewed.  The patient understands that  there is a risk of failure of the procedure of approximately 1 in 250 to  1 in 300, which may result in either an intrauterine or an ectopic  pregnancy.   FINDINGS:  Laparoscopy demonstrated a normal retroverted uterus.  There  was a  3-4-mm mass, which was superficially attached to the serosa of the  right ovary.  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  The bilateral fallopian tubes, left ovary, uterus,  gallbladder, liver, peritoneal surfaces, intestine, and omentum all  appeared to be normal.  An attempt was made to visualize the appendix,  but this appeared to be retrocecal.   SPECIMENS:  The right ovarian biopsy was sent to pathology.   PROCEDURE:  The patient was reidentified in the preoperative hold area.  She did receive cefotetan for antibiotic prophylaxis and TED hose for  DVT prophylaxis.   In the operating room, general endotracheal anesthesia was induced and  the patient was then placed in the dorsal lithotomy position.  The  abdomen and vagina were then sterilely prepped.  A Foley catheter was  placed inside the bladder.  A speculum was placed inside the vagina and  a single-tooth tenaculum was placed on the anterior cervical lip and a  Hasson cannula was attached to this.  The speculum was then removed.  The patient was then sterilely draped.   Attention was turned to  the abdomen where a 1-cm umbilical incision was  created sharply with a scalpel.  The incision was carried down to the  fascia using blunt dissection.  A 10-mm trocar was inserted directly  into the peritoneal cavity and the laparoscope confirmed proper  placement.  A pneumoperitoneum was achieved.  The patient was placed in  Trendelenburg position.  A 5-mm suprapubic incision was created with a  scalpel.  An examination of the pelvic and abdominal organs was  performed and the findings were as noted above.   A Kleppinger forceps was then used to grasp the right fallopian tube and  followed all the way to its fimbriated end.  The fallopian tube was then  fulgurated over length of 3-cm.  There was good burn into the  mesosalpinx.  The same procedure was performed on the right-hand side  and was then  repeated on the left-hand side, and the left fallopian tube  was followed all the way to its fimbriated end.   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  It was then removed from the  peritoneal cavity and was sent to pathology for further analysis.   The operative sites were examined and there was good hemostasis.  The  suprapubic trocar was then removed under visualization of the  laparoscope.  The pneumoperitoneum was released and a 10-mm trocar and  the laparoscope were removed simultaneously.   The incisions were then closed with subcuticular sutures of 3-0 plain  gut suture.  Dermabond was placed over the incisions.   The vaginal instruments and Foley catheter were removed.   This concluded the patient's procedure.  There were no complications.  All needle, instrument, and sponge counts were correct.      Randye Lobo, M.D.  Electronically Signed     BES/MEDQ  D:  03/10/2008  T:  03/10/2008  Job:  [id number]   cc:   Pierce Crane, MD  Fax: [phone]

## 2011-03-07 NOTE — Op Note (Signed)
NAMEALONIE, [name]               ACCOUNT NO.:  [id number]   MEDICAL RECORD NO.:  [id number]          PATIENT TYPE:  AMB   LOCATION:  SDS                          FACILITY:  [facility]   PHYSICIAN:  Ollen Gross. Vernell Morgans, M.D. DATE OF BIRTH:  [birth year]   DATE OF PROCEDURE:  11/17/2008  DATE OF DISCHARGE:  11/17/2008                               OPERATIVE REPORT   PREOPERATIVE DIAGNOSIS:  Left breast cancer.   POSTOPERATIVE DIAGNOSIS:  Left breast cancer.   PROCEDURE:  Removal of Port-A-Cath.   SURGEON:  Ollen Gross. Vernell Morgans, MD   ANESTHESIA:  Local with IV sedation.   PROCEDURE:  After informed consent was obtained, the patient was brought  to the operating room, placed in supine position on the operating table.  After adequate IV sedation had been given, the patient's right chest  area was prepped with Betadine and draped in usual sterile manner.  The  area around the port was infiltrated with 1% lidocaine with epinephrine.  A small incision was made through her old incision with a 15-blade  knife.  This incision was carried down through the subcutaneous tissue  sharply with Metzenbaum scissors until the port was identified.  The  capsule surrounding the port was opened sharply with a 15-blade knife.  Blunt hemostat dissection was then carried out until the two Prolene  stitches holding the port in place were identified.  These were divided  with scissors and stitches removed.  Once this was accomplished, we are  able to push the port out of the cavity.  We then with gentle traction  removed the port without difficulty.  Pressure was held for about 5  minutes until the area was completely hemostatic.  The deep layer of the  wound was then closed with interrupted 3-0 Vicryl stitches.  The skin  was closed with a running 4-0 Monocryl subcuticular stitch.  Dermabond  dressing was applied.  The patient tolerated the procedure well.  At the  end of the case, all needle, sponge, and instrument  counts were correct.  The patient was then awakened and taken recovery room in stable  condition.      Ollen Gross. Vernell Morgans, M.D.  Electronically Signed     PST/MEDQ  D:  11/17/2008  T:  11/18/2008  Job:  [id number]

## 2011-03-07 NOTE — Op Note (Signed)
[name], [name]               ACCOUNT NO.:  [id number]   MEDICAL RECORD NO.:  [id number]          PATIENT TYPE:  AMB   LOCATION:  DSC                          FACILITY:  [facility]   PHYSICIAN:  Ollen Gross. Vernell Morgans, M.D. DATE OF BIRTH:  1966/03/07   DATE OF PROCEDURE:  01/20/2008  DATE OF DISCHARGE:                               OPERATIVE REPORT   PREOPERATIVE DIAGNOSIS:  Left breast cancer.   POSTOPERATIVE DIAGNOSIS:  Left breast cancer.   PROCEDURE:  Left breast needle-localized lumpectomy.   SURGEON:  Ollen Gross. Vernell Morgans, M.D.   ANESTHESIA:  General via LMA.   DESCRIPTION OF PROCEDURE:  After informed consent was obtained, the  patient was brought to the operating room, placed in supine position on  the operating room table.  After induction of general anesthesia the  patient's left breast was prepped with Betadine and draped in usual  sterile manner.  Earlier in the day the patient had undergone a wire  localization procedure and the wire was entering the breast in about the  12 o'clock position and heading inferiorly just medial to the nipple.  A  small curvilinear incision was made in the upper portion of the breast  between the wire entry site in the areola with a 15 blade knife.  This  incision was carried down through the skin into the subcutaneous tissue  sharply with the electrocautery.  The path of the wire was palpable.  A  circular portion of breast tissue was removed around the path of the  wire.  This was done circumferentially using the electrocautery.  The  dissection was carried down to the chest wall and the muscular fascia of  the chest wall was taken with the specimen.  Once the specimen was  completely removed, it was oriented with a short stitch superior and  long stitch lateral and sent to radiology and pathology for further  evaluation.  Specimen radiograph showed the clip to be in the specimen.  Hemostasis was achieved using the Bovie electrocautery.  The  wound was  irrigated copious amounts of saline.  The wound was infiltrated with  0.25% Marcaine.  The deep layer of the wound was then closed with  interrupted 3-0 Vicryl stitches and the skin was closed with a running 4-  0 Monocryl subcuticular stitch.  A Dermabond dressing was applied.  The  patient tolerated the procedure well.  At the end of the case all  needle, sponge and instrument counts were correct.  The patient was then  awakened and taken to recovery in stable condition.      Ollen Gross. Vernell Morgans, M.D.  Electronically Signed     PST/MEDQ  D:  01/20/2008  T:  01/20/2008  Job:  [id number]

## 2011-03-10 ENCOUNTER — Encounter (HOSPITAL_BASED_OUTPATIENT_CLINIC_OR_DEPARTMENT_OTHER): Payer: PRIVATE HEALTH INSURANCE | Admitting: Oncology

## 2011-03-10 ENCOUNTER — Other Ambulatory Visit: Payer: Self-pay | Admitting: Physician Assistant

## 2011-03-10 ENCOUNTER — Other Ambulatory Visit: Payer: Self-pay | Admitting: Oncology

## 2011-03-10 DIAGNOSIS — C50919 Malignant neoplasm of unspecified site of unspecified female breast: Secondary | ICD-10-CM

## 2011-03-10 DIAGNOSIS — Z5112 Encounter for antineoplastic immunotherapy: Secondary | ICD-10-CM

## 2011-03-10 DIAGNOSIS — C787 Secondary malignant neoplasm of liver and intrahepatic bile duct: Secondary | ICD-10-CM

## 2011-03-10 LAB — CBC WITH DIFFERENTIAL/PLATELET
BASO%: 0.7 % (ref 0.0–2.0)
Eosinophils Absolute: 0.1 10*3/uL (ref 0.0–0.5)
HCT: 39.2 % (ref 34.8–46.6)
HGB: 14.1 g/dL (ref 11.6–15.9)
LYMPH%: 37.4 % (ref 14.0–49.7)
MCHC: 36 g/dL (ref 31.5–36.0)
MONO#: 0.3 10*3/uL (ref 0.1–0.9)
NEUT#: 2.2 10*3/uL (ref 1.5–6.5)
NEUT%: 52.4 % (ref 38.4–76.8)
Platelets: 148 10*3/uL (ref 145–400)
WBC: 4.3 10*3/uL (ref 3.9–10.3)
lymph#: 1.6 10*3/uL (ref 0.9–3.3)

## 2011-03-10 LAB — COMPREHENSIVE METABOLIC PANEL
ALT: 13 U/L (ref 0–35)
AST: 19 U/L (ref 0–37)
Albumin: 4 g/dL (ref 3.5–5.2)
BUN: 17 mg/dL (ref 6–23)
CO2: 24 mEq/L (ref 19–32)
Calcium: 9.4 mg/dL (ref 8.4–10.5)
Chloride: 105 mEq/L (ref 96–112)
Creatinine, Ser: 0.75 mg/dL (ref 0.40–1.20)
Potassium: 3.8 mEq/L (ref 3.5–5.3)

## 2011-03-10 NOTE — Discharge Summary (Signed)
[name], BIBY               ACCOUNT NO.:  [id number]   MEDICAL RECORD NO.:  [id number]          PATIENT TYPE:  INP   LOCATION:  9103                          FACILITY:  [facility]   PHYSICIAN:  Gerrit Friends. Aldona Bar, M.D.   DATE OF BIRTH:  [birth year]   DATE OF ADMISSION:  11/01/2006  DATE OF DISCHARGE:  11/04/2006                               DISCHARGE SUMMARY   DISCHARGE DIAGNOSIS:  1. Term pregnancy, delivered, 6 pound 10 ounce female infant, Apgars 9      and 9.  2. Blood type A+.  3. Breech presentation   PROCEDURE:  Primary low-transverse cesarean section.   SUMMARY:  This 45 year old gravida 2, para 1 presented at 38-1/2 weeks'  gestation with labor and ruptured membranes on the morning of 01/10.  She was noted to be 9 cm dilated with a breech presentation which was  known prior; and for which a cesarean section was electively scheduled  for January 15.  She was taken to the operating room by Dr. Edward Jolly, at  which time she underwent a low-transverse cesarean section with delivery  of a 6 pound 10 ounce female infant with Apgars of 9 and 9 and from a  frank breech presentation.   The patient's postpartum course was totally benign.  Her wound was  closed with Steri-Strips and a subcuticular closure.  Her discharge  hemoglobin was 12.9 with a white count of 11,900, and a platelet count  of 170,000.  The morning of 01/13 she was ambulating well, tolerating a  regular diet well, having normal bowel and bladder function; and was  afebrile.  The wound was clean and dry; the vital signs were stable; and  she was desirous of discharge; and accordingly discharged to home with  appropriate instructions.   DISCHARGE MEDICATIONS INCLUDE:  1. Motrin 600 mg q.6 h. as needed for cramping or mild pain.  2. Tylox one to two q.4-6 h. as needed for more severe pain.  She was      bottle feeding at the time of discharge without difficulty; and was      appropriately instructed.  She will return to the  office for follow-      up in approximately 4 weeks' time or as needed.   CONDITION ON DISCHARGE:  Improved.      Gerrit Friends. Aldona Bar, M.D.  Electronically Signed     RMW/MEDQ  D:  11/04/2006  T:  11/04/2006  Job:  [id number]

## 2011-03-10 NOTE — Op Note (Signed)
NAMEANBER, MCKIVER               ACCOUNT NO.:  [id number]   MEDICAL RECORD NO.:  [id number]          PATIENT TYPE:  INP   LOCATION:  9103                          FACILITY:  [facility]   PHYSICIAN:  Randye Lobo, M.D.   DATE OF BIRTH:  January 30, 1966   DATE OF PROCEDURE:  11/01/2006  DATE OF DISCHARGE:                               OPERATIVE REPORT   PREOPERATIVE DIAGNOSES:  1. Intrauterine gestation at 21 plus 3 weeks.  2. Active labor.  3. Breech presentation.   POSTOPERATIVE DIAGNOSES:  1. Intrauterine gestation at 38 +3 weeks.  2. Active labor.  3. Homero Fellers breech presentation.   PROCEDURE:  Stat primary low segment transverse cesarean section.   SURGEON:  Randye Lobo, MD   ANESTHESIA:  Spinal.   IV FLUIDS:  2700 mL Ringer's lactate.   ESTIMATED BLOOD LOSS:  400 mL.   URINE OUTPUT:  25 mL.   COMPLICATIONS:  None.   INDICATIONS FOR PROCEDURE:  The patient is a 45 year old gravida 2, para  1, Caucasian female at 91 plus 3 weeks' gestation ([facility] November 12, 2006  by last menstrual period), who had a known breech presentation and a  plan for a primary cesarean section November 06, 2006, who presented to  maternity admissions with cramping pressure and then spontaneous rupture  of membranes late this morning.  The patient's antenatal course had been  significant for late prenatal care at 25 weeks' gestation, and the AMA  status with no genetic testing, tobacco use, gestational diabetes  mellitus diet controlled, and a rubella nonimmune status.  The patient's  cervical exam by the nurse in maternity admissions was noted to be 9 cm  dilated with a breech presentation confirmed.  The fetal heart rate  tracing was reassuring.  I was contacted and immediately the patient was  consented for a primary cesarean section and she was brought down to the  operating room for immediate delivery.   FINDINGS:  A viable female was delivered at 11:29 a.m. with Apgars of 9 at  one minute and 9a t  five minutes.  The presentation was frank breech.  The weight was 6 pounds 10 ounces.  The amniotic fluid was clear.  The  uterus, tubes and ovaries were normal.   SPECIMENS:  None.   PROCEDURE:  The patient was escorted from the maternity admissions area  down to the operating room.  A spinal anesthetic was administered.  The  patient was then placed in the supine position and the abdomen was  sterilely prepped and a Foley catheter was sterilely placed inside the  bladder.  A cervical exam was performed and the patient was noted to be  still 9 cm with the forebag intact.   The patient was sterilely draped.  An Allis test was performed and the  patient was noted to have adequate anesthesia.  A Pfannenstiel incision  was then created with a scalpel.  The patient was noted to have some  discomfort and 3% Nesacaine was placed in the subcutaneous layer, which  achieved pain control completely.  The incision was carried down  to the  fascial layer using monopolar cautery for hemostasis.  The fascia was  then incised in the midline with the scalpel and the fascial incision  was extended bilaterally.  The rectus muscles were separated from the  fascia superiorly and inferiorly.  The rectus muscles were sharply  divided in the midline.  The parietal peritoneum was elevated with a  hemostat clamp and entered sharply.  The peritoneal incision was  extended cranially and caudally.   The bladder retractor was used to expose the lower uterine segment and  the bladder flap was sharply created.  A transverse lower uterine  segment incision was then created with a scalpel.  The incision was  extended bilaterally in an upward fashion with the bandage scissors.  A  hand was inserted through the uterine incision and the buttocks were  delivered through the uterine incision without difficulty.  With proper  fundal pressure applied, the torso of the newborn was delivered.  The  legs were then extended.   Each of the arms were swept across the chest  and the arms and shoulders were delivered.  Then the vertex was  delivered with gentle rotation of the head.   The nares and mouth were suctioned.  The cord was doubly clamped and cut  and the newborn was carried over to the awaiting pediatricians in  vigorous condition.   Cord blood was obtained.  The placenta was manually extracted and the  patient received Pitocin 20 units IV.  The patient did receive Ancef 1 g  IV at cord clamp.   The uterus was exteriorized at this point and it was wiped clean with a  moistened lap pad.  The uterine incision was closed with a double-layer  closure of #1 chromic.  The first was a running locked layer and the  second was an imbricating layer.  Hemostasis was noted to be good.  The  uterus was returned to the peritoneal cavity at this time, which was  irrigated and suctioned.  Again hemostasis of the uterine incision was  good.   The peritoneum was closed with a running suture of 3-0 Vicryl.  The  rectus muscles were reapproximated with interrupted sutures of #1  chromic.  The fascia was closed with a running suture of 0 Vicryl.   The subcutaneous layer was made hemostatic with monopolar cautery.  The  skin was closed with staples.  The abdomen was then cleansed of any  remaining Betadine.  The abdominal incision was examined and the staples  were noted to be completely loose and not holding well.  This was  attributed to the patient's very thin subcutaneous layer.  As there was  no good approximation using the staples, a decision was made to place a  subcuticular layer at this time.   The abdomen already had the drapes removed, so the incisional area was  reprepped and draped and the patient was noted to be comfortable with  anesthesia.  The staples were removed and a subcuticular closure of 4-0 Vicryl was placed, which resulted in good approximation of the skin.  The remaining Betadine was then  cleansed from around the incision.  Steri-Strips and Benzoin were placed over the incision and a sterile  pressure dressing was placed over this.   This concluded the patient's procedure.  There were no complications.  All needle, instrument, and sponge counts were correct.      Randye Lobo, M.D.  Electronically Signed     BES/MEDQ  D:  11/01/2006  T:  11/01/2006  Job:  [id number]

## 2011-03-17 ENCOUNTER — Ambulatory Visit (HOSPITAL_COMMUNITY)
Admission: RE | Admit: 2011-03-17 | Discharge: 2011-03-17 | Disposition: A | Discharge status: Home / Self Care | Payer: PRIVATE HEALTH INSURANCE | Source: Ambulatory Visit | Attending: Oncology | Admitting: Oncology

## 2011-03-17 ENCOUNTER — Encounter (HOSPITAL_COMMUNITY): Payer: Self-pay

## 2011-03-17 ENCOUNTER — Ambulatory Visit (HOSPITAL_COMMUNITY)
Admission: RE | Admit: 2011-03-17 | Discharge: 2011-03-17 | Disposition: A | Discharge status: Home / Self Care | Payer: No Typology Code available for payment source | Source: Ambulatory Visit | Attending: Oncology | Admitting: Oncology

## 2011-03-17 DIAGNOSIS — C50919 Malignant neoplasm of unspecified site of unspecified female breast: Secondary | ICD-10-CM | POA: Insufficient documentation

## 2011-03-17 DIAGNOSIS — J984 Other disorders of lung: Secondary | ICD-10-CM | POA: Insufficient documentation

## 2011-03-17 DIAGNOSIS — N854 Malposition of uterus: Secondary | ICD-10-CM | POA: Insufficient documentation

## 2011-03-17 DIAGNOSIS — R599 Enlarged lymph nodes, unspecified: Secondary | ICD-10-CM | POA: Insufficient documentation

## 2011-03-17 DIAGNOSIS — Z79899 Other long term (current) drug therapy: Secondary | ICD-10-CM | POA: Insufficient documentation

## 2011-03-17 LAB — GLUCOSE, CAPILLARY: Glucose-Capillary: 95 mg/dL (ref 70–99)

## 2011-03-17 MED ORDER — FLUDEOXYGLUCOSE F - 18 (FDG) INJECTION
17.0000 | Freq: Once | INTRAVENOUS | Status: AC | PRN
  Administered 2011-03-17: 17 via INTRAVENOUS

## 2011-03-31 ENCOUNTER — Encounter (HOSPITAL_BASED_OUTPATIENT_CLINIC_OR_DEPARTMENT_OTHER): Payer: PRIVATE HEALTH INSURANCE | Admitting: Oncology

## 2011-03-31 ENCOUNTER — Other Ambulatory Visit: Payer: Self-pay | Admitting: Physician Assistant

## 2011-03-31 ENCOUNTER — Other Ambulatory Visit: Payer: Self-pay | Admitting: Oncology

## 2011-03-31 DIAGNOSIS — C50919 Malignant neoplasm of unspecified site of unspecified female breast: Secondary | ICD-10-CM

## 2011-03-31 DIAGNOSIS — Z5111 Encounter for antineoplastic chemotherapy: Secondary | ICD-10-CM

## 2011-03-31 DIAGNOSIS — Z171 Estrogen receptor negative status [ER-]: Secondary | ICD-10-CM

## 2011-03-31 DIAGNOSIS — Z5112 Encounter for antineoplastic immunotherapy: Secondary | ICD-10-CM

## 2011-03-31 DIAGNOSIS — C787 Secondary malignant neoplasm of liver and intrahepatic bile duct: Secondary | ICD-10-CM

## 2011-03-31 LAB — CBC WITH DIFFERENTIAL/PLATELET
BASO%: 0.7 % (ref 0.0–2.0)
EOS%: 2.3 % (ref 0.0–7.0)
HGB: 13.8 g/dL (ref 11.6–15.9)
MCH: 31.9 pg (ref 25.1–34.0)
MCV: 90.1 fL (ref 79.5–101.0)
MONO%: 7.6 % (ref 0.0–14.0)
RBC: 4.33 10*6/uL (ref 3.70–5.45)
RDW: 12.2 % (ref 11.2–14.5)
lymph#: 1.4 10*3/uL (ref 0.9–3.3)
nRBC: 0 % (ref 0–0)

## 2011-03-31 LAB — COMPREHENSIVE METABOLIC PANEL
ALT: 13 U/L (ref 0–35)
AST: 18 U/L (ref 0–37)
Albumin: 4.1 g/dL (ref 3.5–5.2)
Alkaline Phosphatase: 60 U/L (ref 39–117)
Potassium: 4 mEq/L (ref 3.5–5.3)
Sodium: 141 mEq/L (ref 135–145)
Total Bilirubin: 0.5 mg/dL (ref 0.3–1.2)
Total Protein: 6.4 g/dL (ref 6.0–8.3)

## 2011-04-21 ENCOUNTER — Encounter (HOSPITAL_BASED_OUTPATIENT_CLINIC_OR_DEPARTMENT_OTHER): Payer: PRIVATE HEALTH INSURANCE | Admitting: Oncology

## 2011-04-21 ENCOUNTER — Other Ambulatory Visit: Payer: Self-pay | Admitting: Oncology

## 2011-04-21 DIAGNOSIS — C787 Secondary malignant neoplasm of liver and intrahepatic bile duct: Secondary | ICD-10-CM

## 2011-04-21 DIAGNOSIS — Z5112 Encounter for antineoplastic immunotherapy: Secondary | ICD-10-CM

## 2011-04-21 DIAGNOSIS — Z5111 Encounter for antineoplastic chemotherapy: Secondary | ICD-10-CM

## 2011-04-21 DIAGNOSIS — C50919 Malignant neoplasm of unspecified site of unspecified female breast: Secondary | ICD-10-CM

## 2011-04-21 LAB — CBC WITH DIFFERENTIAL/PLATELET
EOS%: 1.2 % (ref 0.0–7.0)
Eosinophils Absolute: 0 10*3/uL (ref 0.0–0.5)
LYMPH%: 37.9 % (ref 14.0–49.7)
MCH: 34 pg (ref 25.1–34.0)
MCHC: 35.7 g/dL (ref 31.5–36.0)
MCV: 95 fL (ref 79.5–101.0)
MONO%: 8.7 % (ref 0.0–14.0)
NEUT#: 1.6 10*3/uL (ref 1.5–6.5)
Platelets: 150 10*3/uL (ref 145–400)
RBC: 4.01 10*6/uL (ref 3.70–5.45)

## 2011-04-21 LAB — COMPREHENSIVE METABOLIC PANEL
AST: 13 U/L (ref 0–37)
Alkaline Phosphatase: 60 U/L (ref 39–117)
Glucose, Bld: 99 mg/dL (ref 70–99)
Sodium: 141 mEq/L (ref 135–145)
Total Bilirubin: 0.7 mg/dL (ref 0.3–1.2)
Total Protein: 6.5 g/dL (ref 6.0–8.3)

## 2011-05-12 ENCOUNTER — Encounter (HOSPITAL_BASED_OUTPATIENT_CLINIC_OR_DEPARTMENT_OTHER): Payer: PRIVATE HEALTH INSURANCE | Admitting: Oncology

## 2011-05-12 ENCOUNTER — Other Ambulatory Visit: Payer: Self-pay | Admitting: Oncology

## 2011-05-12 DIAGNOSIS — Z5112 Encounter for antineoplastic immunotherapy: Secondary | ICD-10-CM

## 2011-05-12 DIAGNOSIS — C50919 Malignant neoplasm of unspecified site of unspecified female breast: Secondary | ICD-10-CM

## 2011-05-12 DIAGNOSIS — C787 Secondary malignant neoplasm of liver and intrahepatic bile duct: Secondary | ICD-10-CM

## 2011-05-12 LAB — COMPREHENSIVE METABOLIC PANEL
ALT: 13 U/L (ref 0–35)
AST: 17 U/L (ref 0–37)
Albumin: 4 g/dL (ref 3.5–5.2)
Calcium: 9.1 mg/dL (ref 8.4–10.5)
Chloride: 104 mEq/L (ref 96–112)
Potassium: 3.9 mEq/L (ref 3.5–5.3)
Sodium: 141 mEq/L (ref 135–145)

## 2011-05-12 LAB — CBC WITH DIFFERENTIAL/PLATELET
Basophils Absolute: 0 10*3/uL (ref 0.0–0.1)
Eosinophils Absolute: 0.1 10*3/uL (ref 0.0–0.5)
HGB: 13.3 g/dL (ref 11.6–15.9)
LYMPH%: 41.5 % (ref 14.0–49.7)
MCV: 91.6 fL (ref 79.5–101.0)
MONO%: 6.5 % (ref 0.0–14.0)
NEUT#: 2.2 10*3/uL (ref 1.5–6.5)
Platelets: 157 10*3/uL (ref 145–400)

## 2011-05-31 ENCOUNTER — Ambulatory Visit (HOSPITAL_COMMUNITY)
Admission: RE | Admit: 2011-05-31 | Discharge: 2011-05-31 | Disposition: A | Discharge status: Home / Self Care | Payer: PRIVATE HEALTH INSURANCE | Source: Ambulatory Visit | Attending: Oncology | Admitting: Oncology

## 2011-05-31 DIAGNOSIS — J984 Other disorders of lung: Secondary | ICD-10-CM | POA: Insufficient documentation

## 2011-05-31 DIAGNOSIS — Z9221 Personal history of antineoplastic chemotherapy: Secondary | ICD-10-CM | POA: Insufficient documentation

## 2011-05-31 DIAGNOSIS — C50919 Malignant neoplasm of unspecified site of unspecified female breast: Secondary | ICD-10-CM | POA: Insufficient documentation

## 2011-05-31 MED ORDER — IOHEXOL 300 MG/ML  SOLN
80.0000 mL | Freq: Once | INTRAMUSCULAR | Status: AC | PRN
  Administered 2011-05-31: 80 mL via INTRAVENOUS

## 2011-06-02 ENCOUNTER — Other Ambulatory Visit: Payer: Self-pay | Admitting: Oncology

## 2011-06-02 ENCOUNTER — Encounter (HOSPITAL_BASED_OUTPATIENT_CLINIC_OR_DEPARTMENT_OTHER): Payer: PRIVATE HEALTH INSURANCE | Admitting: Oncology

## 2011-06-02 DIAGNOSIS — J189 Pneumonia, unspecified organism: Secondary | ICD-10-CM

## 2011-06-02 DIAGNOSIS — C50919 Malignant neoplasm of unspecified site of unspecified female breast: Secondary | ICD-10-CM

## 2011-06-02 DIAGNOSIS — C787 Secondary malignant neoplasm of liver and intrahepatic bile duct: Secondary | ICD-10-CM

## 2011-06-02 LAB — CBC WITH DIFFERENTIAL/PLATELET
Basophils Absolute: 0 10*3/uL (ref 0.0–0.1)
HCT: 39.5 % (ref 34.8–46.6)
HGB: 14 g/dL (ref 11.6–15.9)
MONO#: 0.2 10*3/uL (ref 0.1–0.9)
NEUT%: 55 % (ref 38.4–76.8)
Platelets: 150 10*3/uL (ref 145–400)
WBC: 4.5 10*3/uL (ref 3.9–10.3)
lymph#: 1.7 10*3/uL (ref 0.9–3.3)

## 2011-06-06 ENCOUNTER — Ambulatory Visit (HOSPITAL_COMMUNITY)
Admission: RE | Admit: 2011-06-06 | Discharge: 2011-06-06 | Disposition: A | Discharge status: Home / Self Care | Payer: No Typology Code available for payment source | Source: Ambulatory Visit | Attending: Oncology | Admitting: Oncology

## 2011-06-06 DIAGNOSIS — C50919 Malignant neoplasm of unspecified site of unspecified female breast: Secondary | ICD-10-CM | POA: Insufficient documentation

## 2011-06-06 DIAGNOSIS — Z79899 Other long term (current) drug therapy: Secondary | ICD-10-CM | POA: Insufficient documentation

## 2011-06-06 DIAGNOSIS — I079 Rheumatic tricuspid valve disease, unspecified: Secondary | ICD-10-CM | POA: Insufficient documentation

## 2011-06-06 DIAGNOSIS — I059 Rheumatic mitral valve disease, unspecified: Secondary | ICD-10-CM | POA: Insufficient documentation

## 2011-06-09 ENCOUNTER — Encounter (HOSPITAL_BASED_OUTPATIENT_CLINIC_OR_DEPARTMENT_OTHER): Payer: PRIVATE HEALTH INSURANCE | Admitting: Oncology

## 2011-06-09 DIAGNOSIS — C50919 Malignant neoplasm of unspecified site of unspecified female breast: Secondary | ICD-10-CM

## 2011-06-09 DIAGNOSIS — C787 Secondary malignant neoplasm of liver and intrahepatic bile duct: Secondary | ICD-10-CM

## 2011-06-09 DIAGNOSIS — Z5112 Encounter for antineoplastic immunotherapy: Secondary | ICD-10-CM

## 2011-06-30 ENCOUNTER — Encounter (HOSPITAL_BASED_OUTPATIENT_CLINIC_OR_DEPARTMENT_OTHER): Payer: PRIVATE HEALTH INSURANCE | Admitting: Oncology

## 2011-06-30 ENCOUNTER — Other Ambulatory Visit: Payer: Self-pay | Admitting: Physician Assistant

## 2011-06-30 DIAGNOSIS — Z5111 Encounter for antineoplastic chemotherapy: Secondary | ICD-10-CM

## 2011-06-30 DIAGNOSIS — C787 Secondary malignant neoplasm of liver and intrahepatic bile duct: Secondary | ICD-10-CM

## 2011-06-30 DIAGNOSIS — C50919 Malignant neoplasm of unspecified site of unspecified female breast: Secondary | ICD-10-CM

## 2011-06-30 DIAGNOSIS — Z5112 Encounter for antineoplastic immunotherapy: Secondary | ICD-10-CM

## 2011-06-30 LAB — CBC WITH DIFFERENTIAL/PLATELET
Eosinophils Absolute: 0.1 10*3/uL (ref 0.0–0.5)
LYMPH%: 34.5 % (ref 14.0–49.7)
MCHC: 35.8 g/dL (ref 31.5–36.0)
MCV: 91.7 fL (ref 79.5–101.0)
MONO%: 5.2 % (ref 0.0–14.0)
NEUT#: 3.4 10*3/uL (ref 1.5–6.5)
Platelets: 145 10*3/uL (ref 145–400)
RBC: 4.08 10*6/uL (ref 3.70–5.45)
nRBC: 0 % (ref 0–0)

## 2011-06-30 LAB — COMPREHENSIVE METABOLIC PANEL
ALT: 12 U/L (ref 0–35)
CO2: 25 mEq/L (ref 19–32)
Creatinine, Ser: 0.79 mg/dL (ref 0.50–1.10)
Total Bilirubin: 0.5 mg/dL (ref 0.3–1.2)

## 2011-06-30 LAB — LACTATE DEHYDROGENASE: LDH: 154 U/L (ref 94–250)

## 2011-06-30 LAB — CANCER ANTIGEN 27.29: CA 27.29: 17 U/mL (ref 0–39)

## 2011-07-04 ENCOUNTER — Other Ambulatory Visit: Payer: Self-pay | Admitting: Oncology

## 2011-07-04 DIAGNOSIS — Z9889 Other specified postprocedural states: Secondary | ICD-10-CM

## 2011-07-14 ENCOUNTER — Ambulatory Visit
Admission: RE | Admit: 2011-07-14 | Discharge: 2011-07-14 | Disposition: A | Discharge status: Home / Self Care | Payer: PRIVATE HEALTH INSURANCE | Source: Ambulatory Visit | Attending: Oncology | Admitting: Oncology

## 2011-07-14 DIAGNOSIS — Z9889 Other specified postprocedural states: Secondary | ICD-10-CM

## 2011-07-17 LAB — COMPREHENSIVE METABOLIC PANEL
AST: 20
Albumin: 3.3 — ABNORMAL LOW
BUN: 12
Chloride: 105
Creatinine, Ser: 0.71
GFR calc Af Amer: >60
Potassium: 3.7
Total Protein: 5.2 — ABNORMAL LOW

## 2011-07-17 LAB — APTT: aPTT: 27

## 2011-07-17 LAB — DIFFERENTIAL
Basophils Relative: 0
Lymphocytes Relative: 32
Lymphs Abs: 1.5
Monocytes Relative: 12
Neutro Abs: 2.6

## 2011-07-17 LAB — CBC
Hemoglobin: 11.7 — ABNORMAL LOW
MCHC: 34.7
RBC: 3.04 — ABNORMAL LOW

## 2011-07-17 LAB — PROTIME-INR: INR: 0.9

## 2011-07-17 LAB — POCT HEMOGLOBIN-HEMACUE: Hemoglobin: 12.4

## 2011-07-19 LAB — URINALYSIS, ROUTINE W REFLEX MICROSCOPIC
Glucose, UA: NEGATIVE
Ketones, ur: NEGATIVE
Leukocytes, UA: NEGATIVE
pH: 6

## 2011-07-19 LAB — PROTIME-INR: Prothrombin Time: 13.1

## 2011-07-19 LAB — URINE MICROSCOPIC-ADD ON

## 2011-07-19 LAB — COMPREHENSIVE METABOLIC PANEL
ALT: 13
AST: 17
Alkaline Phosphatase: 51
CO2: 29
Calcium: 9.3
Chloride: 103
GFR calc Af Amer: >60
GFR calc non Af Amer: >60
Glucose, Bld: 79
Potassium: 3.6
Sodium: 139
Total Bilirubin: 0.6

## 2011-07-19 LAB — CBC
Hemoglobin: 13.8
RBC: 3.73 — ABNORMAL LOW
WBC: 4.8

## 2011-07-19 LAB — DIFFERENTIAL
Basophils Relative: 1
Eosinophils Absolute: 0.1
Eosinophils Relative: 2
Lymphs Abs: 2.1

## 2011-07-21 ENCOUNTER — Encounter (HOSPITAL_BASED_OUTPATIENT_CLINIC_OR_DEPARTMENT_OTHER): Payer: PRIVATE HEALTH INSURANCE | Admitting: Oncology

## 2011-07-21 ENCOUNTER — Other Ambulatory Visit: Payer: Self-pay | Admitting: Physician Assistant

## 2011-07-21 DIAGNOSIS — Z5111 Encounter for antineoplastic chemotherapy: Secondary | ICD-10-CM

## 2011-07-21 DIAGNOSIS — Z5112 Encounter for antineoplastic immunotherapy: Secondary | ICD-10-CM

## 2011-07-21 DIAGNOSIS — C50919 Malignant neoplasm of unspecified site of unspecified female breast: Secondary | ICD-10-CM

## 2011-07-21 DIAGNOSIS — C787 Secondary malignant neoplasm of liver and intrahepatic bile duct: Secondary | ICD-10-CM

## 2011-07-21 LAB — CBC WITH DIFFERENTIAL/PLATELET
Eosinophils Absolute: 0.1 10*3/uL (ref 0.0–0.5)
MONO#: 0.5 10*3/uL (ref 0.1–0.9)
NEUT#: 2.3 10*3/uL (ref 1.5–6.5)
RBC: 4.27 10*6/uL (ref 3.70–5.45)
RDW: 12 % (ref 11.2–14.5)
WBC: 4 10*3/uL (ref 3.9–10.3)
nRBC: 0 % (ref 0–0)

## 2011-08-01 LAB — POCT HEMOGLOBIN-HEMACUE
Hemoglobin: 16.1 — ABNORMAL HIGH
Operator id: 112821

## 2011-08-11 ENCOUNTER — Other Ambulatory Visit: Payer: Self-pay | Admitting: Physician Assistant

## 2011-08-11 ENCOUNTER — Encounter (HOSPITAL_BASED_OUTPATIENT_CLINIC_OR_DEPARTMENT_OTHER): Payer: PRIVATE HEALTH INSURANCE | Admitting: Oncology

## 2011-08-11 DIAGNOSIS — Z5111 Encounter for antineoplastic chemotherapy: Secondary | ICD-10-CM

## 2011-08-11 DIAGNOSIS — Z5112 Encounter for antineoplastic immunotherapy: Secondary | ICD-10-CM

## 2011-08-11 DIAGNOSIS — C50919 Malignant neoplasm of unspecified site of unspecified female breast: Secondary | ICD-10-CM

## 2011-08-11 DIAGNOSIS — C787 Secondary malignant neoplasm of liver and intrahepatic bile duct: Secondary | ICD-10-CM

## 2011-08-11 DIAGNOSIS — Z171 Estrogen receptor negative status [ER-]: Secondary | ICD-10-CM

## 2011-08-11 LAB — CBC WITH DIFFERENTIAL/PLATELET
Eosinophils Absolute: 0.1 10*3/uL (ref 0.0–0.5)
MONO#: 0.3 10*3/uL (ref 0.1–0.9)
MONO%: 5.4 % (ref 0.0–14.0)
NEUT#: 3 10*3/uL (ref 1.5–6.5)
RBC: 4.1 10*6/uL (ref 3.70–5.45)
RDW: 12.3 % (ref 11.2–14.5)
WBC: 5 10*3/uL (ref 3.9–10.3)
nRBC: 0 % (ref 0–0)

## 2011-08-11 LAB — COMPREHENSIVE METABOLIC PANEL
ALT: 10 U/L (ref 0–35)
CO2: 27 mEq/L (ref 19–32)
Chloride: 105 mEq/L (ref 96–112)
Sodium: 142 mEq/L (ref 135–145)
Total Bilirubin: 0.4 mg/dL (ref 0.3–1.2)
Total Protein: 6.3 g/dL (ref 6.0–8.3)

## 2011-08-28 ENCOUNTER — Other Ambulatory Visit: Payer: Self-pay | Admitting: Physician Assistant

## 2011-08-28 ENCOUNTER — Ambulatory Visit: Payer: PRIVATE HEALTH INSURANCE

## 2011-08-28 ENCOUNTER — Other Ambulatory Visit (HOSPITAL_BASED_OUTPATIENT_CLINIC_OR_DEPARTMENT_OTHER): Payer: PRIVATE HEALTH INSURANCE | Admitting: Lab

## 2011-08-28 DIAGNOSIS — C50919 Malignant neoplasm of unspecified site of unspecified female breast: Secondary | ICD-10-CM

## 2011-08-28 DIAGNOSIS — C787 Secondary malignant neoplasm of liver and intrahepatic bile duct: Secondary | ICD-10-CM

## 2011-08-28 DIAGNOSIS — Z5111 Encounter for antineoplastic chemotherapy: Secondary | ICD-10-CM

## 2011-08-28 DIAGNOSIS — Z5112 Encounter for antineoplastic immunotherapy: Secondary | ICD-10-CM

## 2011-08-28 LAB — CBC WITH DIFFERENTIAL/PLATELET
Basophils Absolute: 0 10*3/uL (ref 0.0–0.1)
Eosinophils Absolute: 0.1 10*3/uL (ref 0.0–0.5)
MCV: 92.1 fL (ref 79.5–101.0)
NEUT%: 56.4 % (ref 38.4–76.8)
RDW: 12.1 % (ref 11.2–14.5)
WBC: 4.6 10*3/uL (ref 3.9–10.3)
lymph#: 1.6 10*3/uL (ref 0.9–3.3)

## 2011-08-29 ENCOUNTER — Encounter: Payer: Self-pay | Admitting: Certified Registered Nurse Anesthetist

## 2011-09-05 ENCOUNTER — Ambulatory Visit (HOSPITAL_COMMUNITY)
Admission: RE | Admit: 2011-09-05 | Discharge: 2011-09-05 | Disposition: A | Discharge status: Home / Self Care | Payer: PRIVATE HEALTH INSURANCE | Source: Ambulatory Visit | Attending: Oncology | Admitting: Oncology

## 2011-09-05 DIAGNOSIS — I079 Rheumatic tricuspid valve disease, unspecified: Secondary | ICD-10-CM | POA: Insufficient documentation

## 2011-09-05 DIAGNOSIS — C50919 Malignant neoplasm of unspecified site of unspecified female breast: Secondary | ICD-10-CM | POA: Insufficient documentation

## 2011-09-05 DIAGNOSIS — Z79899 Other long term (current) drug therapy: Secondary | ICD-10-CM | POA: Insufficient documentation

## 2011-09-05 NOTE — Progress Notes (Signed)
  Echocardiogram 2D Echocardiogram has been performed.  [name] Nira Retort 09/05/2011, 9:48 AM

## 2011-09-20 ENCOUNTER — Ambulatory Visit (HOSPITAL_BASED_OUTPATIENT_CLINIC_OR_DEPARTMENT_OTHER): Payer: PRIVATE HEALTH INSURANCE

## 2011-09-20 ENCOUNTER — Ambulatory Visit (HOSPITAL_BASED_OUTPATIENT_CLINIC_OR_DEPARTMENT_OTHER): Payer: PRIVATE HEALTH INSURANCE | Admitting: Physician Assistant

## 2011-09-20 ENCOUNTER — Other Ambulatory Visit (HOSPITAL_BASED_OUTPATIENT_CLINIC_OR_DEPARTMENT_OTHER): Payer: PRIVATE HEALTH INSURANCE | Admitting: Lab

## 2011-09-20 ENCOUNTER — Other Ambulatory Visit: Payer: Self-pay | Admitting: Oncology

## 2011-09-20 ENCOUNTER — Telehealth: Payer: Self-pay | Admitting: Oncology

## 2011-09-20 ENCOUNTER — Other Ambulatory Visit: Payer: Self-pay

## 2011-09-20 VITALS — BP 97/67 | HR 72 | Temp 98.5°F | Ht 61.0 in | Wt 97.5 lb

## 2011-09-20 DIAGNOSIS — C50919 Malignant neoplasm of unspecified site of unspecified female breast: Secondary | ICD-10-CM

## 2011-09-20 DIAGNOSIS — Z5112 Encounter for antineoplastic immunotherapy: Secondary | ICD-10-CM

## 2011-09-20 DIAGNOSIS — C773 Secondary and unspecified malignant neoplasm of axilla and upper limb lymph nodes: Secondary | ICD-10-CM

## 2011-09-20 DIAGNOSIS — F4321 Adjustment disorder with depressed mood: Secondary | ICD-10-CM

## 2011-09-20 DIAGNOSIS — C787 Secondary malignant neoplasm of liver and intrahepatic bile duct: Secondary | ICD-10-CM

## 2011-09-20 DIAGNOSIS — Z5111 Encounter for antineoplastic chemotherapy: Secondary | ICD-10-CM

## 2011-09-20 LAB — COMPREHENSIVE METABOLIC PANEL
AST: 19 U/L (ref 0–37)
Albumin: 4.3 g/dL (ref 3.5–5.2)
BUN: 17 mg/dL (ref 6–23)
CO2: 28 mEq/L (ref 19–32)
Calcium: 9.7 mg/dL (ref 8.4–10.5)
Chloride: 105 mEq/L (ref 96–112)
Glucose, Bld: 88 mg/dL (ref 70–99)
Potassium: 3.9 mEq/L (ref 3.5–5.3)

## 2011-09-20 LAB — CBC WITH DIFFERENTIAL/PLATELET
Basophils Absolute: 0 10*3/uL (ref 0.0–0.1)
Eosinophils Absolute: 0.1 10*3/uL (ref 0.0–0.5)
HCT: 39.6 % (ref 34.8–46.6)
HGB: 14.1 g/dL (ref 11.6–15.9)
LYMPH%: 30.4 % (ref 14.0–49.7)
MCV: 93 fL (ref 79.5–101.0)
MONO#: 0.3 10*3/uL (ref 0.1–0.9)
NEUT#: 3.9 10*3/uL (ref 1.5–6.5)
NEUT%: 63.9 % (ref 38.4–76.8)
Platelets: 163 10*3/uL (ref 145–400)
WBC: 6.1 10*3/uL (ref 3.9–10.3)
nRBC: 0 % (ref 0–0)

## 2011-09-20 MED ORDER — SODIUM CHLORIDE 0.9 % IJ SOLN
10.0000 mL | INTRAMUSCULAR | Status: DC | PRN
  Administered 2011-09-20: 10 mL
  Filled 2011-09-20: qty 10

## 2011-09-20 MED ORDER — SODIUM CHLORIDE 0.9 % IV SOLN
Freq: Once | INTRAVENOUS | Status: AC
  Administered 2011-09-20: 15:00:00 via INTRAVENOUS

## 2011-09-20 MED ORDER — TRASTUZUMAB CHEMO INJECTION 440 MG
6.0000 mg/kg | Freq: Once | INTRAVENOUS | Status: AC
  Administered 2011-09-20: 273 mg via INTRAVENOUS
  Filled 2011-09-20: qty 13

## 2011-09-20 MED ORDER — DIPHENHYDRAMINE HCL 25 MG PO CAPS: 50.0000 mg | ORAL_CAPSULE | Freq: Once | ORAL | Status: DC

## 2011-09-20 MED ORDER — HEPARIN SOD (PORK) LOCK FLUSH 100 UNIT/ML IV SOLN
500.0000 [IU] | Freq: Once | INTRAVENOUS | Status: AC | PRN
  Administered 2011-09-20: 500 [IU]
  Filled 2011-09-20: qty 5

## 2011-09-20 MED ORDER — ACETAMINOPHEN 325 MG PO TABS
650.0000 mg | ORAL_TABLET | Freq: Once | ORAL | Status: AC
  Administered 2011-09-20: 650 mg via ORAL

## 2011-09-20 MED ORDER — VENLAFAXINE HCL ER 37.5 MG PO CP24: 37.5000 mg | ORAL_CAPSULE | Freq: Every day | ORAL | Status: DC

## 2011-09-20 NOTE — Telephone Encounter (Signed)
Gv pt appt for [id number].  scheduled pt for pet scan on 09/29/2011 @ [facility].

## 2011-09-20 NOTE — Patient Instructions (Signed)
1618-Pt discharged ambulatory with next appointment confirmed.  Pt aware to call with any questions or concerns.

## 2011-09-20 NOTE — Progress Notes (Signed)
PROBLEMS: 73. 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  She completed 6 cycles of Abraxane/Herceptin on     11/18/2010, now on q.3-week maintenance Herceptin. 2. Prior history of neoadjuvant chemotherapy comprised of 6 cycles of     q.3-week Taxotere, carboplatin and Herceptin with weekly Herceptin     and Neulasta on day 2 followed by a left lumpectomy with sentinel     node dissection April of 2009.  Radiation was completed to the     remaining breast tissue.  SUBJECTIVE:  [name] is seen today with her husband, [name], in accompaniment prior to her next q.3-week dose of Herceptin.  She voices has no specific complaints today except that she has been "ill."  Specifically, she has been quite emotionally labile.  She also states though that she admits she has probably been a little bit on the depressed side.  She does have hot flashes at night that really do interrupt her sleep pattern quite a bit.  She also notes that her appetite is a bit decreased though she denies any frank nausea.  She has no emesis, no diarrhea or constipation issues.  She is staying hydrated but drinks mostly [facility].  She has no dysuria symptoms.  She denies any pain. Echocardiogram performed on 09/05/2011 revealed a left ventricular ejection fraction between 55% and 60%.  It should be noted, the patient denies any suicidal ideations or planning.  REVIEW OF SYSTEMS:  Otherwise negative.  ALLERGIES:  No known drug allergies.  CURRENT MEDICATIONS:  As per EMR.  ECOG STATUS:  1.  OBJECTIVE/PHYSICAL EXAM:  Vital signs:  Blood pressure 97/67, pulse 72, respirations 20, temp 98.5, weight 97 pounds.  HEENT:  Conjunctivae pink.  Sclerae anicteric.  Oropharynx is benign without oral mucositis or candidiasis.  Lungs:  Clear to  auscultation.  No evidence of wheezing or rhonchi.  No vertebral tenderness with gentle percussion.  Heart: Regular rate and rhythm without murmurs, rubs, gallops, or clicks. Abdomen:  Soft, normal bowel sounds.  Extremities:  Free of pedal edema. Neurologic:  Exam is nonfocal.  The patient is alert and oriented x3.  LABORATORY DATA:  Hemoglobin 14.1 g, platelet count 163,000, WBC 6700 with an ANC of 3900.  Chemistry panel from today is pending.  IMPRESSION: 1. A 45 year old [name], [place] woman with metastatic HER-     2 positive, ER/PR negative breast carcinoma, due for next q.3-week     dose of maintenance Herceptin. 2. Probable menopausal symptoms but also some mild appropriate     situational depression.  Case has been reviewed with Dr. Donnie Coffin who     spoke with [name] and her husband, [name].  PLAN:  Proceed with treatment today as scheduled.  We discussed the possibility of obtaining a PET-CT scan for restaging purposes in the hopes that if this is clear we can give [name] a couple months off.  With this said, we will also start her on Effexor XR 37.5 mg per day.  A prescription was provided.  [name] also spoke with [name] and her husband about participating in the [employer]. She has agreed to consider this.  I will officially see her back in 3 weeks' time prior to her next Herceptin dosing, though she will touch base with [place] in about  a week or so to let us know how she is doing with the Effexor XR.    ______________________________ Amber Nimrod, PA CS/MEDQ  D:  09/20/2011  T:  09/20/2011  Job:  [id number]

## 2011-09-20 NOTE — Progress Notes (Signed)
Progress note dictated-CTS 

## 2011-09-29 ENCOUNTER — Encounter (HOSPITAL_COMMUNITY): Payer: Self-pay

## 2011-09-29 ENCOUNTER — Encounter (HOSPITAL_COMMUNITY)
Admission: RE | Admit: 2011-09-29 | Discharge: 2011-09-29 | Disposition: A | Discharge status: Home / Self Care | Payer: No Typology Code available for payment source | Source: Ambulatory Visit | Attending: Physician Assistant | Admitting: Physician Assistant

## 2011-09-29 DIAGNOSIS — C50919 Malignant neoplasm of unspecified site of unspecified female breast: Secondary | ICD-10-CM

## 2011-09-29 MED ORDER — FLUDEOXYGLUCOSE F - 18 (FDG) INJECTION
18.2000 | Freq: Once | INTRAVENOUS | Status: AC | PRN
  Administered 2011-09-29: 18.2 via INTRAVENOUS

## 2011-10-03 LAB — GLUCOSE, CAPILLARY: Glucose-Capillary: 102 mg/dL — ABNORMAL HIGH (ref 70–99)

## 2011-10-04 ENCOUNTER — Other Ambulatory Visit: Payer: Self-pay | Admitting: *Deleted

## 2011-10-10 ENCOUNTER — Telehealth: Payer: Self-pay | Admitting: *Deleted

## 2011-10-10 NOTE — Telephone Encounter (Signed)
patient confirmed over the phone the new time same date

## 2011-10-13 ENCOUNTER — Telehealth: Payer: Self-pay | Admitting: Oncology

## 2011-10-13 ENCOUNTER — Other Ambulatory Visit: Payer: No Typology Code available for payment source | Admitting: Lab

## 2011-10-13 ENCOUNTER — Other Ambulatory Visit: Payer: Self-pay | Admitting: Oncology

## 2011-10-13 ENCOUNTER — Ambulatory Visit (HOSPITAL_BASED_OUTPATIENT_CLINIC_OR_DEPARTMENT_OTHER): Payer: PRIVATE HEALTH INSURANCE

## 2011-10-13 ENCOUNTER — Ambulatory Visit (HOSPITAL_BASED_OUTPATIENT_CLINIC_OR_DEPARTMENT_OTHER): Payer: PRIVATE HEALTH INSURANCE | Admitting: Physician Assistant

## 2011-10-13 ENCOUNTER — Other Ambulatory Visit: Payer: Self-pay

## 2011-10-13 DIAGNOSIS — C787 Secondary malignant neoplasm of liver and intrahepatic bile duct: Secondary | ICD-10-CM

## 2011-10-13 DIAGNOSIS — C50919 Malignant neoplasm of unspecified site of unspecified female breast: Secondary | ICD-10-CM

## 2011-10-13 DIAGNOSIS — C773 Secondary and unspecified malignant neoplasm of axilla and upper limb lymph nodes: Secondary | ICD-10-CM

## 2011-10-13 DIAGNOSIS — Z171 Estrogen receptor negative status [ER-]: Secondary | ICD-10-CM

## 2011-10-13 DIAGNOSIS — Z5112 Encounter for antineoplastic immunotherapy: Secondary | ICD-10-CM

## 2011-10-13 LAB — CBC WITH DIFFERENTIAL/PLATELET
Basophils Absolute: 0 10*3/uL (ref 0.0–0.1)
Eosinophils Absolute: 0.1 10*3/uL (ref 0.0–0.5)
HGB: 13.8 g/dL (ref 11.6–15.9)
NEUT#: 3.1 10*3/uL (ref 1.5–6.5)
RDW: 11.9 % (ref 11.2–14.5)
lymph#: 1.9 10*3/uL (ref 0.9–3.3)

## 2011-10-13 LAB — CANCER ANTIGEN 27.29: CA 27.29: 16 U/mL (ref 0–39)

## 2011-10-13 LAB — LACTATE DEHYDROGENASE: LDH: 160 U/L (ref 94–250)

## 2011-10-13 LAB — COMPREHENSIVE METABOLIC PANEL
Albumin: 3.7 g/dL (ref 3.5–5.2)
Alkaline Phosphatase: 72 U/L (ref 39–117)
CO2: 30 mEq/L (ref 19–32)
Glucose, Bld: 90 mg/dL (ref 70–99)
Potassium: 3.7 mEq/L (ref 3.5–5.3)
Sodium: 139 mEq/L (ref 135–145)
Total Protein: 6.8 g/dL (ref 6.0–8.3)

## 2011-10-13 MED ORDER — SODIUM CHLORIDE 0.9 % IJ SOLN
10.0000 mL | INTRAMUSCULAR | Status: DC | PRN
  Administered 2011-10-13: 10 mL
  Filled 2011-10-13: qty 10

## 2011-10-13 MED ORDER — HEPARIN SOD (PORK) LOCK FLUSH 100 UNIT/ML IV SOLN
500.0000 [IU] | Freq: Once | INTRAVENOUS | Status: AC | PRN
  Administered 2011-10-13: 500 [IU]
  Filled 2011-10-13: qty 5

## 2011-10-13 MED ORDER — SODIUM CHLORIDE 0.9 % IV SOLN
Freq: Once | INTRAVENOUS | Status: AC
  Administered 2011-10-13: 16:00:00 via INTRAVENOUS

## 2011-10-13 MED ORDER — TRASTUZUMAB CHEMO INJECTION 440 MG
6.0000 mg/kg | Freq: Once | INTRAVENOUS | Status: AC
  Administered 2011-10-13: 273 mg via INTRAVENOUS
  Filled 2011-10-13: qty 13

## 2011-10-13 MED ORDER — ACETAMINOPHEN 325 MG PO TABS
650.0000 mg | ORAL_TABLET | Freq: Once | ORAL | Status: AC
  Administered 2011-10-13: 650 mg via ORAL

## 2011-10-13 NOTE — Progress Notes (Signed)
Progress note dictated-CTS 

## 2011-10-13 NOTE — Telephone Encounter (Signed)
Gv pt appt for [id number] °

## 2011-10-13 NOTE — Progress Notes (Signed)
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  She is now on maintenance q.3 week Herceptin and recent restaging studies.  SUBJECTIVE:  [name] is seen today with her husband [name] in accompaniment to go over her most recent PET scan prior to her next q.3 week dose of Herceptin.  Of note, a PET scan was obtained on 09/29/2011 and compared to images from 03/17/2011 which showed persistent malignant range uptake associated with a left subpectoral lymph node.  She denies any pain in this area.  No shortness of breath or chest pain.  No unexplained nausea, emesis, diarrhea, or constipation problems.  She admits that she did not take her Effexor XR since it gave her diarrhea symptoms.  REVIEW OF SYSTEMS:  Otherwise negative.  ALLERGIES:  No known drug allergies.  CURRENT MEDICATIONS:  As per EMR.  ECOG STATUS:  1.  PHYSICAL EXAM:  Vital Signs:  Blood pressure is 129/83, pulse 103, respirations 20, temperature 97.5, weight 94 pounds.  HEENT: Conjunctivae pink.  Sclerae anicteric.  Oropharynx is benign without oral mucositis or candidosis.  Lungs:  Clear to auscultation without wheezing or rhonchi.  Heart:  Regular rate and rhythm without murmurs, rubs, gallops, or clicks.  Abdomen:  Soft and nontender without organomegaly.  Normal bowel sounds are present.  Extremities:  Free of pedal edema.  Neurologic:  Exam is nonfocal.  LABORATORY DATA:  Hemoglobin 13.8 g, platelet count 168,000, WBC 5500 with an ANC of 3100.  IMPRESSION:  A 45 year old [place], [place] woman with metastatic HER-2/neu positive, ER/PR negative breast carcinoma, due for next q.3 week dose of maintenance Herceptin with evidence of stable disease per recent PET-CT.  The case has been reviewed with Dr.  Donnie Coffin.  PLAN:  [name] will receive treatment today as scheduled, but we will cancel the appointment in January to give her a bit of a break.  We will regroup in early February.  She and her husband agree with this plan.    ______________________________ Amber Nimrod, PA Dictating For Pierce Crane, M.D., F.R.C.P.C. CS/MEDQ  D:  10/13/2011  T:  10/13/2011  Job:  [id number]

## 2011-11-10 ENCOUNTER — Ambulatory Visit: Payer: PRIVATE HEALTH INSURANCE

## 2011-11-10 ENCOUNTER — Ambulatory Visit: Payer: PRIVATE HEALTH INSURANCE | Admitting: Physician Assistant

## 2011-11-10 ENCOUNTER — Other Ambulatory Visit: Payer: PRIVATE HEALTH INSURANCE | Admitting: Lab

## 2011-11-23 ENCOUNTER — Other Ambulatory Visit: Payer: Self-pay | Admitting: Oncology

## 2011-11-23 DIAGNOSIS — C50919 Malignant neoplasm of unspecified site of unspecified female breast: Secondary | ICD-10-CM

## 2011-11-24 ENCOUNTER — Ambulatory Visit (HOSPITAL_BASED_OUTPATIENT_CLINIC_OR_DEPARTMENT_OTHER): Payer: PRIVATE HEALTH INSURANCE | Admitting: Oncology

## 2011-11-24 ENCOUNTER — Telehealth: Payer: Self-pay | Admitting: Oncology

## 2011-11-24 ENCOUNTER — Other Ambulatory Visit (HOSPITAL_BASED_OUTPATIENT_CLINIC_OR_DEPARTMENT_OTHER): Payer: PRIVATE HEALTH INSURANCE | Admitting: Lab

## 2011-11-24 ENCOUNTER — Ambulatory Visit (HOSPITAL_BASED_OUTPATIENT_CLINIC_OR_DEPARTMENT_OTHER): Payer: PRIVATE HEALTH INSURANCE

## 2011-11-24 VITALS — BP 113/73 | HR 81 | Temp 98.3°F | Ht 61.0 in | Wt 94.4 lb

## 2011-11-24 DIAGNOSIS — C50919 Malignant neoplasm of unspecified site of unspecified female breast: Secondary | ICD-10-CM

## 2011-11-24 DIAGNOSIS — Z09 Encounter for follow-up examination after completed treatment for conditions other than malignant neoplasm: Secondary | ICD-10-CM

## 2011-11-24 DIAGNOSIS — Z5111 Encounter for antineoplastic chemotherapy: Secondary | ICD-10-CM

## 2011-11-24 DIAGNOSIS — Z171 Estrogen receptor negative status [ER-]: Secondary | ICD-10-CM

## 2011-11-24 LAB — COMPREHENSIVE METABOLIC PANEL
ALT: 8 U/L (ref 0–35)
AST: 14 U/L (ref 0–37)
Alkaline Phosphatase: 67 U/L (ref 39–117)
Calcium: 9.3 mg/dL (ref 8.4–10.5)
Chloride: 107 mEq/L (ref 96–112)
Creatinine, Ser: 0.74 mg/dL (ref 0.50–1.10)
Total Bilirubin: 0.4 mg/dL (ref 0.3–1.2)

## 2011-11-24 LAB — CBC WITH DIFFERENTIAL/PLATELET
BASO%: 0.3 % (ref 0.0–2.0)
EOS%: 0.9 % (ref 0.0–7.0)
HCT: 38.5 % (ref 34.8–46.6)
MCH: 33 pg (ref 25.1–34.0)
MCHC: 36.1 g/dL — ABNORMAL HIGH (ref 31.5–36.0)
MCV: 91.4 fL (ref 79.5–101.0)
MONO%: 3.5 % (ref 0.0–14.0)
NEUT%: 73.9 % (ref 38.4–76.8)
lymph#: 1.4 10*3/uL (ref 0.9–3.3)

## 2011-11-24 MED ORDER — ACETAMINOPHEN 325 MG PO TABS
650.0000 mg | ORAL_TABLET | Freq: Once | ORAL | Status: AC
  Administered 2011-11-24: 650 mg via ORAL

## 2011-11-24 MED ORDER — SODIUM CHLORIDE 0.9 % IJ SOLN
10.0000 mL | INTRAMUSCULAR | Status: DC | PRN
  Administered 2011-11-24: 10 mL
  Filled 2011-11-24: qty 10

## 2011-11-24 MED ORDER — TRASTUZUMAB CHEMO INJECTION 440 MG
6.0000 mg/kg | Freq: Once | INTRAVENOUS | Status: AC
  Administered 2011-11-24: 273 mg via INTRAVENOUS
  Filled 2011-11-24: qty 13

## 2011-11-24 MED ORDER — HEPARIN SOD (PORK) LOCK FLUSH 100 UNIT/ML IV SOLN
500.0000 [IU] | Freq: Once | INTRAVENOUS | Status: AC | PRN
  Administered 2011-11-24: 500 [IU]
  Filled 2011-11-24: qty 5

## 2011-11-24 MED ORDER — SODIUM CHLORIDE 0.9 % IV SOLN
Freq: Once | INTRAVENOUS | Status: AC
  Administered 2011-11-24: 15:00:00 via INTRAVENOUS

## 2011-11-24 NOTE — Progress Notes (Signed)
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  She is now on maintenance q.3 week Herceptin and recent restaging studies.  SUBJECTIVE:  Eldoris is seen today with her husband Tim in accompaniment.    She denies any pain in general.   No shortness of breath or chest pain.  No unexplained nausea, emesis, diarrhea, or constipation problems.  She admits that she did not take her Effexor XR since it gave her diarrhea symptoms.  REVIEW OF SYSTEMS:  Otherwise negative.  ALLERGIES:  No known drug allergies.  CURRENT MEDICATIONS:  As per EMR.  ECOG STATUS:  1.  PHYSICAL EXAM:  Vital Signs:  Blood pressure is 129/83, pulse 103, respirations 20, temperature 97.5, weight 94 pounds.  HEENT: Conjunctivae pink.  Sclerae anicteric.  Oropharynx is benign without oral mucositis or candidosis.  Lungs:  Clear to auscultation without wheezing or rhonchi.  Heart:  Regular rate and rhythm without murmurs, rubs, gallops, or clicks.  Abdomen:  Soft and nontender without organomegaly.  Normal bowel sounds are present.  Extremities:  Free of pedal edema.  Neurologic:  Exam is nonfocal.  LABORATORY DATA:  Hemoglobin 13.8 g, platelet count 168,000, WBC 5500 with an ANC of 3100.  IMPRESSION:  A 46 year old [place], [place] woman with metastatic HER-2/neu positive, ER/PR negative breast carcinoma, due for next q.3 week dose of maintenance Herceptin with evidence of stable disease per recent PET-CT.  The case has been reviewed with Dr. Donnie Coffin.  PLAN: Assata is doing well, and tolerating her herceptin. We will continue treatment and will recheck 2d echo in march. F/u in 3 weeks.  Pierce Crane MD 11/24/11 ______________________________

## 2011-11-24 NOTE — Telephone Encounter (Signed)
gve the pt her 2013 appt calendar

## 2011-12-15 ENCOUNTER — Other Ambulatory Visit: Payer: Self-pay | Admitting: Physician Assistant

## 2011-12-15 ENCOUNTER — Other Ambulatory Visit (HOSPITAL_BASED_OUTPATIENT_CLINIC_OR_DEPARTMENT_OTHER): Payer: PRIVATE HEALTH INSURANCE | Admitting: Lab

## 2011-12-15 ENCOUNTER — Ambulatory Visit (HOSPITAL_BASED_OUTPATIENT_CLINIC_OR_DEPARTMENT_OTHER): Payer: PRIVATE HEALTH INSURANCE

## 2011-12-15 ENCOUNTER — Ambulatory Visit: Payer: PRIVATE HEALTH INSURANCE | Admitting: Physician Assistant

## 2011-12-15 VITALS — BP 117/77 | HR 80 | Temp 98.1°F | Ht 61.0 in | Wt 92.7 lb

## 2011-12-15 DIAGNOSIS — C50919 Malignant neoplasm of unspecified site of unspecified female breast: Secondary | ICD-10-CM

## 2011-12-15 DIAGNOSIS — Z5112 Encounter for antineoplastic immunotherapy: Secondary | ICD-10-CM

## 2011-12-15 LAB — COMPREHENSIVE METABOLIC PANEL
AST: 17 U/L (ref 0–37)
Albumin: 4 g/dL (ref 3.5–5.2)
Alkaline Phosphatase: 63 U/L (ref 39–117)
BUN: 16 mg/dL (ref 6–23)
Calcium: 9.1 mg/dL (ref 8.4–10.5)
Creatinine, Ser: 0.8 mg/dL (ref 0.50–1.10)
Glucose, Bld: 118 mg/dL — ABNORMAL HIGH (ref 70–99)

## 2011-12-15 LAB — CBC WITH DIFFERENTIAL/PLATELET
Basophils Absolute: 0.1 10*3/uL (ref 0.0–0.1)
EOS%: 1 % (ref 0.0–7.0)
Eosinophils Absolute: 0.1 10*3/uL (ref 0.0–0.5)
HCT: 39 % (ref 34.8–46.6)
HGB: 13.5 g/dL (ref 11.6–15.9)
LYMPH%: 31.6 % (ref 14.0–49.7)
MCH: 33.7 pg (ref 25.1–34.0)
MCV: 97.4 fL (ref 79.5–101.0)
MONO%: 5.6 % (ref 0.0–14.0)
NEUT#: 3 10*3/uL (ref 1.5–6.5)
NEUT%: 59.2 % (ref 38.4–76.8)
Platelets: 141 10*3/uL — ABNORMAL LOW (ref 145–400)

## 2011-12-15 MED ORDER — TRASTUZUMAB CHEMO INJECTION 440 MG
6.0000 mg/kg | Freq: Once | INTRAVENOUS | Status: AC
  Administered 2011-12-15: 273 mg via INTRAVENOUS
  Filled 2011-12-15: qty 13

## 2011-12-15 MED ORDER — ACETAMINOPHEN 325 MG PO TABS
650.0000 mg | ORAL_TABLET | Freq: Once | ORAL | Status: AC
  Administered 2011-12-15: 650 mg via ORAL

## 2011-12-15 MED ORDER — SODIUM CHLORIDE 0.9 % IV SOLN
Freq: Once | INTRAVENOUS | Status: AC
  Administered 2011-12-15: 15:00:00 via INTRAVENOUS

## 2011-12-15 MED ORDER — HEPARIN SOD (PORK) LOCK FLUSH 100 UNIT/ML IV SOLN
500.0000 [IU] | Freq: Once | INTRAVENOUS | Status: AC | PRN
  Administered 2011-12-15: 500 [IU]
  Filled 2011-12-15: qty 5

## 2011-12-15 MED ORDER — SODIUM CHLORIDE 0.9 % IJ SOLN
10.0000 mL | INTRAMUSCULAR | Status: DC | PRN
  Administered 2011-12-15: 10 mL
  Filled 2011-12-15: qty 10

## 2011-12-15 NOTE — Progress Notes (Signed)
Hematology and Oncology Follow Up Visit  [name] [id number] Mar 21, 1966 46 y.o. 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 She is now on maintenance every 3 week Herceptin.  Interim History:   [name] is seen today with her husband in accompaniment prior to initiating her next Q3 week doses of maintenance Herceptin. She voices no specific complaints, but notes that over this past weekend she did have an episode of some redness just medial to the right nipple/areola complex. She also noted some "streaking" above the area. This resolved spontaneously after about 2 days.  She denies any fevers or chills around this time. She denied any nipple discharge. She has chronic issues with eating, she states that whenever she eats her stomach and abdomen cramp, and she has increased frequency of bowel movements. She denies any hematochezia or melena, she also states that this has been long-standing induration, "all my life". She is extremely reluctant to see a gastroenterologist. She has been making a concerted effort to get more fluids in. She denies shortness of breath or chest pain. She denies a diffuse bone pain.  A detailed review of systems is otherwise noncontributory as noted below.  Review of Systems: Constitutional:  feels well Eyes: no complaints ENT: no complains Cardiovascular: no chest pain or dyspnea on exertion Respiratory: no cough, shortness of breath, or wheezing Neurological: no TIA or stroke symptoms Dermatological: negative Gastrointestinal: positive for cramping, swift bowel movements. This symptom is chronic in nature. Genito-Urinary: no dysuria, trouble voiding, or hematuria Hematological and Lymphatic: negative Breast: positive for episode redness and "streaking" medial  R breast at the nipple, lasted about 2d then resolved. Musculoskeletal: negative Remaining ROS negative.   Medications:   I have reviewed the patient's current medications.  Current Outpatient Prescriptions  Medication Sig Dispense Refill  . acetaminophen (TYLENOL) 325 MG tablet Take 325 mg by mouth every 6 (six) hours as needed. MAY TAKE 1-2 TABS PRN HA       . Cholecalciferol 1000 UNITS tablet Take 1,000 Units by mouth daily.       Marland Kitchen lidocaine-prilocaine (EMLA) cream Apply 1 application topically as needed. #60g tube w/3RF-CTS 10/13/11      . LORazepam (ATIVAN) 0.5 MG tablet Take 0.5 mg by mouth every 8 (eight) hours.      Marland Kitchen venlafaxine (EFFEXOR-XR) 37.5 MG 24 hr capsule Take 1 capsule (37.5 mg total) by mouth daily.  30 capsule  3    Allergies:  Allergies  Allergen Reactions  . No Known Allergies     Physical Exam: Filed Vitals:   12/15/11 1351  BP: 117/77  Pulse: 80  Temp: 98.1 F (36.7 C)  Weight: 92 lbs. HEENT:  Sclerae anicteric, conjunctivae pink.  Oropharynx clear.  No mucositis or candidiasis.   Nodes:  No cervical, supraclavicular, or axillary lymphadenopathy palpated.  Breast Exam:  The right breast was examined, there is no evidence of any dominant mass effect, no nipple inversion, retraction or discharge. No evidence of erythema. The axilla is also clear. Lungs:  Clear to auscultation bilaterally.  No crackles, rhonchi, or wheezes.   Heart:  Regular rate and rhythm.   Abdomen:  Soft, nontender.  Positive bowel sounds.  No organomegaly or masses palpated.   Musculoskeletal:  No focal spinal tenderness to palpation.  Extremities:  Benign.  No peripheral edema  or cyanosis.   Skin:  Benign.   Neuro:  Nonfocal, alert and oriented x 3.   Lab Results: Lab Results  Component Value Date   WBC 6.5 11/24/2011   HGB 13.9 11/24/2011   HCT 38.5 11/24/2011   MCV 91.4 11/24/2011   PLT 173 11/24/2011   NEUTROABS 4.8 11/24/2011     Chemistry      Component Value Date/Time   NA  142 11/24/2011 1300   K 3.8 11/24/2011 1300   CL 107 11/24/2011 1300   CO2 27 11/24/2011 1300   BUN 15 11/24/2011 1300   CREATININE 0.74 11/24/2011 1300      Component Value Date/Time   CALCIUM 9.3 11/24/2011 1300   ALKPHOS 67 11/24/2011 1300   AST 14 11/24/2011 1300   ALT 8 11/24/2011 1300   BILITOT 0.4 11/24/2011 1300       Assessment:  [name] is a 46 year old [place], [place] woman with recurrent HER-2 positive, ER/PR negative breast carcinoma with involvement of the ipsilateral breast, left axillary lymph nodes, and known history of liver involvement. She is now on maintenance every 3 week Herceptin. 2. Chronic GI issues, suggesting possible irritable bowel syndrome. Patient reluctant for gastroenterologist referral, we will try to encourage probiotics.  Case to be reviewed with Dr. Pierce Crane.  Plan:  Barbarita will receive Herceptin today as scheduled, Benadryl has been omitted from her premeds. She will try probiotics and see if this helps with her GI symptoms. Again, we are happy to make a referral to a gastroenterologist if need be. She will be due for her three-month echocardiogram prior to her three-week followup. In regards to the symptoms she had of her right breast, I have encouraged her to contact us if the symptoms should recur so that we can assess her. She and her husband understand and agree with this plan.   This plan was reviewed with the patient, who voices understanding and agreement.  She knows to call with any changes or problems.    Jacqulin Brandenburger T, PA-C 12/15/2011

## 2011-12-15 NOTE — Patient Instructions (Signed)
PROBIOTICS   Try any of the brands. Take as directed.   We will obtain a 2D Echocardiogram prior to 01/05/12 Herceptin.

## 2011-12-27 ENCOUNTER — Ambulatory Visit (HOSPITAL_COMMUNITY)
Admission: RE | Admit: 2011-12-27 | Discharge: 2011-12-27 | Disposition: A | Discharge status: Home / Self Care | Payer: No Typology Code available for payment source | Source: Ambulatory Visit | Attending: Oncology | Admitting: Oncology

## 2011-12-27 DIAGNOSIS — Z09 Encounter for follow-up examination after completed treatment for conditions other than malignant neoplasm: Secondary | ICD-10-CM | POA: Insufficient documentation

## 2011-12-27 DIAGNOSIS — C50919 Malignant neoplasm of unspecified site of unspecified female breast: Secondary | ICD-10-CM | POA: Insufficient documentation

## 2011-12-27 NOTE — Progress Notes (Signed)
  Echocardiogram 2D Echocardiogram has been performed.  [name] 12/27/2011, 3:06 PM

## 2012-01-05 ENCOUNTER — Ambulatory Visit (HOSPITAL_BASED_OUTPATIENT_CLINIC_OR_DEPARTMENT_OTHER): Payer: PRIVATE HEALTH INSURANCE | Admitting: Physician Assistant

## 2012-01-05 ENCOUNTER — Ambulatory Visit (HOSPITAL_BASED_OUTPATIENT_CLINIC_OR_DEPARTMENT_OTHER): Payer: PRIVATE HEALTH INSURANCE

## 2012-01-05 ENCOUNTER — Other Ambulatory Visit (HOSPITAL_BASED_OUTPATIENT_CLINIC_OR_DEPARTMENT_OTHER): Payer: PRIVATE HEALTH INSURANCE | Admitting: Lab

## 2012-01-05 VITALS — HR 71 | Temp 98.3°F | Ht 61.0 in | Wt 91.5 lb

## 2012-01-05 DIAGNOSIS — Z5112 Encounter for antineoplastic immunotherapy: Secondary | ICD-10-CM

## 2012-01-05 DIAGNOSIS — C50919 Malignant neoplasm of unspecified site of unspecified female breast: Secondary | ICD-10-CM

## 2012-01-05 DIAGNOSIS — C773 Secondary and unspecified malignant neoplasm of axilla and upper limb lymph nodes: Secondary | ICD-10-CM

## 2012-01-05 DIAGNOSIS — K5900 Constipation, unspecified: Secondary | ICD-10-CM

## 2012-01-05 DIAGNOSIS — C787 Secondary malignant neoplasm of liver and intrahepatic bile duct: Secondary | ICD-10-CM

## 2012-01-05 LAB — CBC WITH DIFFERENTIAL/PLATELET
Basophils Absolute: 0 10*3/uL (ref 0.0–0.1)
Eosinophils Absolute: 0.1 10*3/uL (ref 0.0–0.5)
HGB: 13.5 g/dL (ref 11.6–15.9)
MCV: 91.1 fL (ref 79.5–101.0)
MONO#: 0.3 10*3/uL (ref 0.1–0.9)
MONO%: 5.9 % (ref 0.0–14.0)
NEUT#: 3.5 10*3/uL (ref 1.5–6.5)
Platelets: 143 10*3/uL — ABNORMAL LOW (ref 145–400)
RBC: 4.06 10*6/uL (ref 3.70–5.45)
RDW: 12.1 % (ref 11.2–14.5)
WBC: 5.8 10*3/uL (ref 3.9–10.3)
nRBC: 0 % (ref 0–0)

## 2012-01-05 LAB — COMPREHENSIVE METABOLIC PANEL
ALT: 11 U/L (ref 0–35)
Albumin: 4.1 g/dL (ref 3.5–5.2)
CO2: 26 mEq/L (ref 19–32)
Calcium: 9.1 mg/dL (ref 8.4–10.5)
Chloride: 106 mEq/L (ref 96–112)
Glucose, Bld: 103 mg/dL — ABNORMAL HIGH (ref 70–99)
Sodium: 143 mEq/L (ref 135–145)
Total Bilirubin: 0.4 mg/dL (ref 0.3–1.2)
Total Protein: 5.8 g/dL — ABNORMAL LOW (ref 6.0–8.3)

## 2012-01-05 LAB — LACTATE DEHYDROGENASE: LDH: 163 U/L (ref 94–250)

## 2012-01-05 MED ORDER — LORAZEPAM 0.5 MG PO TABS: 0.5000 mg | ORAL_TABLET | Freq: Three times a day (TID) | ORAL | Status: DC

## 2012-01-05 MED ORDER — TRASTUZUMAB CHEMO INJECTION 440 MG
6.0000 mg/kg | Freq: Once | INTRAVENOUS | Status: AC
  Administered 2012-01-05: 273 mg via INTRAVENOUS
  Filled 2012-01-05: qty 13

## 2012-01-05 MED ORDER — ACETAMINOPHEN 325 MG PO TABS
650.0000 mg | ORAL_TABLET | Freq: Once | ORAL | Status: AC
  Administered 2012-01-05: 650 mg via ORAL

## 2012-01-05 NOTE — Progress Notes (Signed)
Hematology and Oncology Follow Up Visit  [name] [id number] [birth year] 46 y.o. 01/05/2012    HPI: [name] is a 46 year old [place], [place] woman with recurrent HER-2 positive, ER/PR negative breast carcinoma with involvement of the ipsilateral breast, left axillary lymph nodes, and known history of liver involvement. She is now on maintenance every 3 week Herceptin.  Interim History:   [name] is seen today with her husband in accompaniment prior to initiating her next Q3 week doses of maintenance Herceptin. She voices no specific complaints, she is feeling well, denying any fevers, chills, night sweats, shortness of breath or chest pain. She does feel that the probiotics helped a bit with her GI tract, but then actually she experienced slight evidence of constipation, she has been off of it for about 2 weeks. Otherwise, she denies any nausea, no frank heartburn symptoms at this point in time. She requests a refill for lorazepam. A detailed review of systems is otherwise noncontributory as noted below.  Review of Systems: Constitutional:  feels well Eyes: no complaints ENT: no complains Cardiovascular: no chest pain or dyspnea on exertion Respiratory: no cough, shortness of breath, or wheezing Neurological: no TIA or stroke symptoms Dermatological: negative Gastrointestinal: positive for cramping, swift bowel movements. This symptom is chronic in nature. Genito-Urinary: no dysuria, trouble voiding, or hematuria Hematological and Lymphatic: negative Breast: negative Musculoskeletal: negative Remaining ROS negative.   Medications:   I have reviewed the patient's current medications.  Current Outpatient Prescriptions  Medication Sig Dispense Refill  . acetaminophen (TYLENOL) 325 MG tablet Take 325 mg by mouth every 6 (six) hours as needed. MAY TAKE 1-2 TABS PRN HA       . Cholecalciferol 1000 UNITS tablet Take 1,000 Units by mouth daily.       Marland Kitchen lidocaine-prilocaine  (EMLA) cream Apply 1 application topically as needed. #60g tube w/3RF-CTS 10/13/11      . LORazepam (ATIVAN) 0.5 MG tablet Take 0.5 mg by mouth every 8 (eight) hours.      Marland Kitchen venlafaxine (EFFEXOR-XR) 37.5 MG 24 hr capsule Take 1 capsule (37.5 mg total) by mouth daily.  30 capsule  3    Allergies:  Allergies  Allergen Reactions  . No Known Allergies     Physical Exam: Filed Vitals:   01/05/12 1333  Pulse: 71  Temp: 98.3 F (36.8 C)  Weight: 91 lbs. HEENT:  Sclerae anicteric, conjunctivae pink.  Oropharynx clear.  No mucositis or candidiasis.   Nodes:  No cervical, supraclavicular, or axillary lymphadenopathy palpated.  Breast Exam:  Deferred. Lungs:  Clear to auscultation bilaterally.  No crackles, rhonchi, or wheezes.   Heart:  Regular rate and rhythm.   Abdomen:  Soft, nontender.  Positive bowel sounds.  No organomegaly or masses palpated.   Musculoskeletal:  No focal spinal tenderness to palpation.  Extremities:  Benign.  No peripheral edema or cyanosis.   Skin:  Benign.   Neuro:  Nonfocal, alert and oriented x 3.   Lab Results: Lab Results  Component Value Date   WBC 5.8 01/05/2012   HGB 13.5 01/05/2012   HCT 37.0 01/05/2012   MCV 91.1 01/05/2012   PLT 143* 01/05/2012   NEUTROABS 3.5 01/05/2012     Chemistry      Component Value Date/Time   NA 142 12/15/2011 1405   K 3.7 12/15/2011 1405   CL 107 12/15/2011 1405   CO2 27 12/15/2011 1405   BUN 16 12/15/2011 1405   CREATININE 0.80 12/15/2011 1405  Component Value Date/Time   CALCIUM 9.1 12/15/2011 1405   ALKPHOS 63 12/15/2011 1405   AST 17 12/15/2011 1405   ALT 10 12/15/2011 1405   BILITOT 0.4 12/15/2011 1405     2D Echocardiogram: 12/27/11 Study Conclusions Left ventricle: The cavity size was normal. Wall thickness was normal. Systolic function was normal. The estimated ejection fraction was in the range of 50% to 55%. Wall motion was normal; there were no regional wall motion abnormalities. Left ventricular  diastolic function parameters were normal. Transthoracic echocardiography. M-mode, complete 2D, spectral Doppler, and color Doppler. Height: Height: 152.4cm. Height: 60in. Weight: Weight: 41.7kg. Weight: 91.8lb. Body mass index: BMI: 18kg/m^2. Body surface area: BSA: 1.32m^2. Blood pressure: 112/75. Patient status: Outpatient. Location: Echo laboratory. Prepared and Electronically Authenticated by Olga Millers, MD, [facility]    Assessment:  [name] is a 46 year old [place], [place] woman with recurrent HER-2 positive, ER/PR negative breast carcinoma with involvement of the ipsilateral breast, left axillary lymph nodes, and known history of liver involvement. She is now on maintenance every 3 week Herceptin. 2. Chronic GI issues, suggesting possible irritable bowel syndrome. Patient reluctant for gastroenterologist referral, we will try to encourage probiotics.  Case to be reviewed with Dr. Pierce Crane.  Plan:  [name] will receive Herceptin today as scheduled, Benadryl has been omitted from her premeds.   I have refilled her lorazepam. She will return in 3 weeks' time for labs and Herceptin alone, then in 6 weeks for labs followup exam and Herceptin. She knows to contact us sooner if the need should arise.   This plan was reviewed with the patient, who voices understanding and agreement.  She knows to call with any changes or problems.    Summerlynn Glauser T, PA-C 01/05/2012

## 2012-01-25 ENCOUNTER — Other Ambulatory Visit: Payer: Self-pay | Admitting: Physician Assistant

## 2012-01-25 DIAGNOSIS — C50919 Malignant neoplasm of unspecified site of unspecified female breast: Secondary | ICD-10-CM

## 2012-01-26 ENCOUNTER — Ambulatory Visit: Payer: PRIVATE HEALTH INSURANCE | Admitting: Physician Assistant

## 2012-01-26 ENCOUNTER — Other Ambulatory Visit: Payer: PRIVATE HEALTH INSURANCE

## 2012-01-26 ENCOUNTER — Ambulatory Visit: Payer: PRIVATE HEALTH INSURANCE

## 2012-02-15 ENCOUNTER — Other Ambulatory Visit: Payer: Self-pay | Admitting: Physician Assistant

## 2012-02-15 DIAGNOSIS — C50919 Malignant neoplasm of unspecified site of unspecified female breast: Secondary | ICD-10-CM

## 2012-02-16 ENCOUNTER — Other Ambulatory Visit (HOSPITAL_BASED_OUTPATIENT_CLINIC_OR_DEPARTMENT_OTHER): Payer: PRIVATE HEALTH INSURANCE | Admitting: Lab

## 2012-02-16 ENCOUNTER — Ambulatory Visit (HOSPITAL_BASED_OUTPATIENT_CLINIC_OR_DEPARTMENT_OTHER): Payer: PRIVATE HEALTH INSURANCE | Admitting: Physician Assistant

## 2012-02-16 ENCOUNTER — Encounter: Payer: Self-pay | Admitting: Physician Assistant

## 2012-02-16 ENCOUNTER — Ambulatory Visit (HOSPITAL_BASED_OUTPATIENT_CLINIC_OR_DEPARTMENT_OTHER): Payer: PRIVATE HEALTH INSURANCE

## 2012-02-16 VITALS — BP 110/71 | HR 65 | Temp 98.4°F | Ht 61.0 in | Wt 87.0 lb

## 2012-02-16 DIAGNOSIS — C50919 Malignant neoplasm of unspecified site of unspecified female breast: Secondary | ICD-10-CM

## 2012-02-16 DIAGNOSIS — C787 Secondary malignant neoplasm of liver and intrahepatic bile duct: Secondary | ICD-10-CM

## 2012-02-16 DIAGNOSIS — R5383 Other fatigue: Secondary | ICD-10-CM

## 2012-02-16 DIAGNOSIS — Z5112 Encounter for antineoplastic immunotherapy: Secondary | ICD-10-CM

## 2012-02-16 DIAGNOSIS — C773 Secondary and unspecified malignant neoplasm of axilla and upper limb lymph nodes: Secondary | ICD-10-CM

## 2012-02-16 DIAGNOSIS — C50912 Malignant neoplasm of unspecified site of left female breast: Secondary | ICD-10-CM

## 2012-02-16 LAB — CBC WITH DIFFERENTIAL/PLATELET
Basophils Absolute: 0 10*3/uL (ref 0.0–0.1)
Eosinophils Absolute: 0.1 10*3/uL (ref 0.0–0.5)
HCT: 38.6 % (ref 34.8–46.6)
HGB: 13.9 g/dL (ref 11.6–15.9)
LYMPH%: 30.9 % (ref 14.0–49.7)
MCHC: 36 g/dL (ref 31.5–36.0)
MONO#: 0.4 10*3/uL (ref 0.1–0.9)
NEUT#: 3.7 10*3/uL (ref 1.5–6.5)
NEUT%: 61.6 % (ref 38.4–76.8)
Platelets: 145 10*3/uL (ref 145–400)
WBC: 6 10*3/uL (ref 3.9–10.3)
lymph#: 1.8 10*3/uL (ref 0.9–3.3)

## 2012-02-16 LAB — COMPREHENSIVE METABOLIC PANEL
Albumin: 4.4 g/dL (ref 3.5–5.2)
BUN: 20 mg/dL (ref 6–23)
CO2: 28 mEq/L (ref 19–32)
Calcium: 9.4 mg/dL (ref 8.4–10.5)
Chloride: 104 mEq/L (ref 96–112)
Creatinine, Ser: 0.79 mg/dL (ref 0.50–1.10)
Potassium: 3.7 mEq/L (ref 3.5–5.3)

## 2012-02-16 LAB — LACTATE DEHYDROGENASE: LDH: 154 U/L (ref 94–250)

## 2012-02-16 MED ORDER — SODIUM CHLORIDE 0.9 % IV SOLN
Freq: Once | INTRAVENOUS | Status: AC
  Administered 2012-02-16: 14:00:00 via INTRAVENOUS

## 2012-02-16 MED ORDER — TRASTUZUMAB CHEMO INJECTION 440 MG
6.0000 mg/kg | Freq: Once | INTRAVENOUS | Status: AC
  Administered 2012-02-16: 252 mg via INTRAVENOUS
  Filled 2012-02-16: qty 12

## 2012-02-16 MED ORDER — HEPARIN SOD (PORK) LOCK FLUSH 100 UNIT/ML IV SOLN
500.0000 [IU] | Freq: Once | INTRAVENOUS | Status: AC | PRN
  Administered 2012-02-16: 500 [IU]
  Filled 2012-02-16: qty 5

## 2012-02-16 MED ORDER — SODIUM CHLORIDE 0.9 % IJ SOLN
10.0000 mL | INTRAMUSCULAR | Status: DC | PRN
  Administered 2012-02-16: 10 mL
  Filled 2012-02-16: qty 10

## 2012-02-16 MED ORDER — ACETAMINOPHEN 325 MG PO TABS
650.0000 mg | ORAL_TABLET | Freq: Once | ORAL | Status: AC
  Administered 2012-02-16: 650 mg via ORAL

## 2012-02-16 NOTE — Progress Notes (Signed)
Hematology and Oncology Follow Up Visit  [name] [id number] [birth year] 46 y.o. 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 She is now on maintenance every 3 week Herceptin.  Interim History:   [name] is seen today with her husband in accompaniment prior to initiating her next Q3 week doses of maintenance Herceptin. She notes that she has been more fatigued over the past week or 2. She also notes that she ran out of her Effexor XR 37.5 mg about a week ago also. Her appetite has been even further decreased, she states she's just not interested in eating, she does not have early satiety or nausea. She is now having relatively normal bowel movements. No fevers chills or night sweats. No shortness of breath or chest pain. No unusual cough. She does smoke but very intermittently. She denies any palpable breast masses, she does not note any unusual pain. Her chronic left upper extremity lymphedema has also improved.  A detailed review of systems is otherwise noncontributory as noted below.  Review of Systems: Constitutional:  Fatigue over the past week. Eyes: no complaints ENT: no complains Cardiovascular: no chest pain or dyspnea on exertion Respiratory: no cough, shortness of breath, or wheezing Neurological: no TIA or stroke symptoms Dermatological: negative Gastrointestinal: positive for cramping, swift bowel movements, she also notes that her appetite has been a bit decreased. Genito-Urinary: no dysuria, trouble voiding, or hematuria Hematological and Lymphatic: negative Breast: negative Musculoskeletal: negative Remaining ROS negative.   Medications:   I have reviewed the patient's current medications.  Current Outpatient Prescriptions  Medication Sig Dispense Refill  .  lidocaine-prilocaine (EMLA) cream Apply 1 application topically as needed. #60g tube w/3RF-CTS 10/13/11      . acetaminophen (TYLENOL) 325 MG tablet Take 325 mg by mouth every 6 (six) hours as needed. MAY TAKE 1-2 TABS PRN HA       . Cholecalciferol 1000 UNITS tablet Take 1,000 Units by mouth daily.       Marland Kitchen LORazepam (ATIVAN) 0.5 MG tablet Take 1 tablet (0.5 mg total) by mouth every 8 (eight) hours. 1-2 po q6-8h prn anxiety/sleep  90 tablet  3  . venlafaxine (EFFEXOR-XR) 37.5 MG 24 hr capsule Take 1 capsule (37.5 mg total) by mouth daily.  30 capsule  3    Allergies:  Allergies  Allergen Reactions  . No Known Allergies     Physical Exam: Filed Vitals:   02/16/12 1302  BP: 110/71  Pulse: 65  Temp: 98.4 F (36.9 C)  Weight: 87 lbs. HEENT:  Sclerae anicteric, conjunctivae pink.  Oropharynx clear.  No mucositis or candidiasis.   Nodes:  No cervical, supraclavicular, or axillary lymphadenopathy palpated.  Breast Exam:  Her left breast was examined, there is no evidence of any mass effect, no nipple inversion or discharge and the axilla is clear. The right breast was examined his treatment inversion or discharge no axillary fullness or lymphadenopathy. Lungs:  Clear to auscultation bilaterally.  No crackles, rhonchi, or wheezes.   Heart:  Regular rate and rhythm.   Abdomen:  Soft, nontender.  Positive bowel sounds.  No organomegaly or masses palpated.   Musculoskeletal:  No focal spinal tenderness to palpation.  Extremities:  Benign.  No peripheral edema or cyanosis.   Skin:  Benign.   Neuro:  Nonfocal, alert and oriented x 3.  Lab Results: Lab Results  Component Value Date   WBC 6.0 02/16/2012   HGB 13.9 02/16/2012   HCT 38.6 02/16/2012   MCV 91.7 02/16/2012   PLT 145 02/16/2012   NEUTROABS 3.7 02/16/2012     Chemistry      Component Value Date/Time   NA 143 01/05/2012 1321   K 3.5 01/05/2012 1321   CL 106 01/05/2012 1321   CO2 26 01/05/2012 1321   BUN 16 01/05/2012 1321    CREATININE 0.76 01/05/2012 1321      Component Value Date/Time   CALCIUM 9.1 01/05/2012 1321   ALKPHOS 61 01/05/2012 1321   AST 15 01/05/2012 1321   ALT 11 01/05/2012 1321   BILITOT 0.4 01/05/2012 1321     2D Echocardiogram: 12/27/11 Study Conclusions Left ventricle: The cavity size was normal. Wall thickness was normal. Systolic function was normal. The estimated ejection fraction was in the range of 50% to 55%. Wall motion was normal; there were no regional wall motion abnormalities. Left ventricular diastolic function parameters were normal. Transthoracic echocardiography. M-mode, complete 2D, spectral Doppler, and color Doppler. Height: Height: 152.4cm. Height: 60in. Weight: Weight: 41.7kg. Weight: 91.8lb. Body mass index: BMI: 18kg/m^2. Body surface area: BSA: 1.71m^2. Blood pressure: 112/75. Patient status: Outpatient. Location: Echo laboratory. 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 She is now on maintenance every 3 week Herceptin. 2. Chronic GI issues, suggesting possible irritable bowel syndrome. Patient reluctant for gastroenterologist referral, we will try to encourage probiotics.  Case reviewed with Dr. Pierce Crane.  Plan:  [name] will receive Herceptin today as scheduled, Benadryl has been omitted from her premeds.  She will return in 3 weeks' time for labs and Herceptin alone, then in 6 weeks for labs followup exam and Herceptin.  She did inquire about the possibility of utilizing "vitamins" for her fatigue, I have recommended that she could try over-the-counter regular strength vitamin B complex. She was encouraged to continue on her Effexor XR for which a refill prescription was provided. She knows  to contact us sooner if the need should arise.   This plan was reviewed with the patient, who voices understanding and agreement.  She knows to call with any changes or problems.    Deryn Massengale T, PA-C 02/16/2012

## 2012-02-16 NOTE — Patient Instructions (Signed)
Vit. B-Complex (OTC) regular strength- take 1 per day

## 2012-03-07 ENCOUNTER — Other Ambulatory Visit: Payer: Self-pay | Admitting: *Deleted

## 2012-03-07 DIAGNOSIS — C50919 Malignant neoplasm of unspecified site of unspecified female breast: Secondary | ICD-10-CM

## 2012-03-08 ENCOUNTER — Other Ambulatory Visit (HOSPITAL_BASED_OUTPATIENT_CLINIC_OR_DEPARTMENT_OTHER): Payer: PRIVATE HEALTH INSURANCE | Admitting: Lab

## 2012-03-08 ENCOUNTER — Ambulatory Visit (HOSPITAL_BASED_OUTPATIENT_CLINIC_OR_DEPARTMENT_OTHER): Payer: PRIVATE HEALTH INSURANCE

## 2012-03-08 ENCOUNTER — Ambulatory Visit: Payer: PRIVATE HEALTH INSURANCE | Admitting: Physician Assistant

## 2012-03-08 VITALS — BP 112/64 | HR 60 | Temp 98.2°F

## 2012-03-08 DIAGNOSIS — C50919 Malignant neoplasm of unspecified site of unspecified female breast: Secondary | ICD-10-CM

## 2012-03-08 DIAGNOSIS — Z5112 Encounter for antineoplastic immunotherapy: Secondary | ICD-10-CM

## 2012-03-08 DIAGNOSIS — C773 Secondary and unspecified malignant neoplasm of axilla and upper limb lymph nodes: Secondary | ICD-10-CM

## 2012-03-08 LAB — COMPREHENSIVE METABOLIC PANEL
Albumin: 3.9 g/dL (ref 3.5–5.2)
Alkaline Phosphatase: 58 U/L (ref 39–117)
BUN: 16 mg/dL (ref 6–23)
Glucose, Bld: 104 mg/dL — ABNORMAL HIGH (ref 70–99)
Total Bilirubin: 0.5 mg/dL (ref 0.3–1.2)

## 2012-03-08 LAB — CBC WITH DIFFERENTIAL/PLATELET
Basophils Absolute: 0 10*3/uL (ref 0.0–0.1)
EOS%: 1.3 % (ref 0.0–7.0)
HCT: 37.1 % (ref 34.8–46.6)
HGB: 13.4 g/dL (ref 11.6–15.9)
LYMPH%: 31.8 % (ref 14.0–49.7)
MCH: 33.1 pg (ref 25.1–34.0)
MCV: 91.6 fL (ref 79.5–101.0)
MONO%: 6.1 % (ref 0.0–14.0)
NEUT%: 60.4 % (ref 38.4–76.8)

## 2012-03-08 MED ORDER — ACETAMINOPHEN 325 MG PO TABS
650.0000 mg | ORAL_TABLET | Freq: Once | ORAL | Status: AC
  Administered 2012-03-08: 650 mg via ORAL

## 2012-03-08 MED ORDER — SODIUM CHLORIDE 0.9 % IJ SOLN
10.0000 mL | INTRAMUSCULAR | Status: DC | PRN
  Administered 2012-03-08: 10 mL
  Filled 2012-03-08: qty 10

## 2012-03-08 MED ORDER — HEPARIN SOD (PORK) LOCK FLUSH 100 UNIT/ML IV SOLN
500.0000 [IU] | Freq: Once | INTRAVENOUS | Status: AC | PRN
  Administered 2012-03-08: 500 [IU]
  Filled 2012-03-08: qty 5

## 2012-03-08 MED ORDER — TRASTUZUMAB CHEMO INJECTION 440 MG
6.0000 mg/kg | Freq: Once | INTRAVENOUS | Status: AC
  Administered 2012-03-08: 252 mg via INTRAVENOUS
  Filled 2012-03-08: qty 12

## 2012-03-08 MED ORDER — SODIUM CHLORIDE 0.9 % IV SOLN
Freq: Once | INTRAVENOUS | Status: AC
  Administered 2012-03-08: 14:00:00 via INTRAVENOUS

## 2012-03-29 ENCOUNTER — Telehealth: Payer: Self-pay | Admitting: Oncology

## 2012-03-29 ENCOUNTER — Ambulatory Visit (HOSPITAL_BASED_OUTPATIENT_CLINIC_OR_DEPARTMENT_OTHER): Payer: PRIVATE HEALTH INSURANCE | Admitting: Physician Assistant

## 2012-03-29 ENCOUNTER — Encounter: Payer: Self-pay | Admitting: Physician Assistant

## 2012-03-29 ENCOUNTER — Ambulatory Visit (HOSPITAL_BASED_OUTPATIENT_CLINIC_OR_DEPARTMENT_OTHER): Payer: PRIVATE HEALTH INSURANCE

## 2012-03-29 ENCOUNTER — Other Ambulatory Visit (HOSPITAL_BASED_OUTPATIENT_CLINIC_OR_DEPARTMENT_OTHER): Payer: PRIVATE HEALTH INSURANCE | Admitting: Lab

## 2012-03-29 VITALS — BP 108/68 | HR 83 | Temp 98.3°F | Ht 61.0 in | Wt 89.0 lb

## 2012-03-29 DIAGNOSIS — C773 Secondary and unspecified malignant neoplasm of axilla and upper limb lymph nodes: Secondary | ICD-10-CM

## 2012-03-29 DIAGNOSIS — C50919 Malignant neoplasm of unspecified site of unspecified female breast: Secondary | ICD-10-CM

## 2012-03-29 DIAGNOSIS — C787 Secondary malignant neoplasm of liver and intrahepatic bile duct: Secondary | ICD-10-CM

## 2012-03-29 DIAGNOSIS — Z5112 Encounter for antineoplastic immunotherapy: Secondary | ICD-10-CM

## 2012-03-29 DIAGNOSIS — Z171 Estrogen receptor negative status [ER-]: Secondary | ICD-10-CM

## 2012-03-29 LAB — CBC WITH DIFFERENTIAL/PLATELET
BASO%: 0.5 % (ref 0.0–2.0)
Basophils Absolute: 0 10*3/uL (ref 0.0–0.1)
EOS%: 0.9 % (ref 0.0–7.0)
HGB: 14.1 g/dL (ref 11.6–15.9)
MCH: 32.9 pg (ref 25.1–34.0)
RBC: 4.28 10*6/uL (ref 3.70–5.45)
RDW: 12.4 % (ref 11.2–14.5)
lymph#: 1.8 10*3/uL (ref 0.9–3.3)
nRBC: 0 % (ref 0–0)

## 2012-03-29 MED ORDER — SODIUM CHLORIDE 0.9 % IV SOLN
Freq: Once | INTRAVENOUS | Status: AC
  Administered 2012-03-29: 14:00:00 via INTRAVENOUS

## 2012-03-29 MED ORDER — HEPARIN SOD (PORK) LOCK FLUSH 100 UNIT/ML IV SOLN
500.0000 [IU] | Freq: Once | INTRAVENOUS | Status: AC | PRN
  Administered 2012-03-29: 500 [IU]
  Filled 2012-03-29: qty 5

## 2012-03-29 MED ORDER — ACETAMINOPHEN 325 MG PO TABS
650.0000 mg | ORAL_TABLET | Freq: Once | ORAL | Status: AC
  Administered 2012-03-29: 650 mg via ORAL

## 2012-03-29 MED ORDER — TRASTUZUMAB CHEMO INJECTION 440 MG
6.0000 mg/kg | Freq: Once | INTRAVENOUS | Status: AC
  Administered 2012-03-29: 252 mg via INTRAVENOUS
  Filled 2012-03-29: qty 12

## 2012-03-29 MED ORDER — SODIUM CHLORIDE 0.9 % IJ SOLN
10.0000 mL | INTRAMUSCULAR | Status: DC | PRN
  Administered 2012-03-29: 10 mL
  Filled 2012-03-29: qty 10

## 2012-03-29 NOTE — Progress Notes (Signed)
Hematology and Oncology Follow Up Visit  [name] [id number] 1965-11-11 46 y.o. 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 She is now on maintenance every 3 week Herceptin.  Interim History:   [name] is seen today with her husband in accompaniment prior to initiating her next Q3 week doses of maintenance Herceptin.  Her overall energy level is actually pretty good. She denies any nausea, her appetite is improved slightly. She is now having relatively normal bowel movements. She is making an effort to drink more water. No fevers chills or night sweats. No shortness of breath or chest pain. No unusual cough. She does smoke but very intermittently. She denies any palpable breast masses, she does not note any unusual pain. Her chronic left upper extremity lymphedema has also improved. She knows that she needs a refill on her gabapentin, Effexor XR causes diarrhea stools. A detailed review of systems is otherwise noncontributory as noted below.  Review of Systems: Constitutional:  Fatigue over the past week. Eyes: no complaints ENT: no complains Cardiovascular: no chest pain or dyspnea on exertion Respiratory: no cough, shortness of breath, or wheezing Neurological: no TIA or stroke symptoms Dermatological: negative Gastrointestinal: positive for cramping, swift bowel movements, she also notes that her appetite has been a bit decreased. Genito-Urinary: no dysuria, trouble voiding, or hematuria Hematological and Lymphatic: negative Breast: negative Musculoskeletal: negative Remaining ROS negative.   Medications:   I have reviewed the patient's current medications.  Current Outpatient Prescriptions  Medication Sig Dispense Refill  . Cholecalciferol 1000 UNITS tablet Take 1,000 Units  by mouth daily.       Marland Kitchen gabapentin (NEURONTIN) 300 MG capsule Take 300 mg by mouth 3 (three) times daily.      Marland Kitchen lidocaine-prilocaine (EMLA) cream Apply 1 application topically as needed. #60g tube w/3RF-CTS 10/13/11      . LORazepam (ATIVAN) 0.5 MG tablet Take 1 tablet (0.5 mg total) by mouth every 8 (eight) hours. 1-2 po q6-8h prn anxiety/sleep  90 tablet  3  . acetaminophen (TYLENOL) 325 MG tablet Take 325 mg by mouth every 6 (six) hours as needed. MAY TAKE 1-2 TABS PRN HA       . venlafaxine (EFFEXOR-XR) 37.5 MG 24 hr capsule Take 1 capsule (37.5 mg total) by mouth daily.  30 capsule  3    Allergies:  Allergies  Allergen Reactions  . No Known Allergies     Physical Exam: Filed Vitals:   03/29/12 1333  BP: 108/68  Pulse: 83  Temp: 98.3 F (36.8 C)  Weight: 89lbs. HEENT:  Sclerae anicteric, conjunctivae pink.  Oropharynx clear.  No mucositis or candidiasis.   Nodes:  No cervical, supraclavicular, or axillary lymphadenopathy palpated.  Breast Exam:  Her left breast was examined, there is no evidence of any mass effect, no nipple inversion or discharge and the axilla is clear. The right breast was examined his treatment inversion or discharge no axillary fullness or lymphadenopathy. Lungs:  Clear to auscultation bilaterally.  No crackles, rhonchi, or wheezes.   Heart:  Regular rate and rhythm.   Abdomen:  Soft, nontender.  Positive bowel sounds.  No organomegaly or masses palpated.   Musculoskeletal:  No focal spinal tenderness to palpation.  Extremities:  Benign.  No peripheral edema or cyanosis.   Skin:  Benign.   Neuro:  Nonfocal,  alert and oriented x 3.   Lab Results: Lab Results  Component Value Date   WBC 6.4 03/29/2012   HGB 14.1 03/29/2012   HCT 39.9 03/29/2012   MCV 93.2 03/29/2012   PLT 168 03/29/2012   NEUTROABS 4.3 03/29/2012     Chemistry      Component Value Date/Time   NA 139 03/08/2012 1309   K 4.0 03/08/2012 1309   CL 105 03/08/2012 1309   CO2 28 03/08/2012 1309    BUN 16 03/08/2012 1309   CREATININE 0.75 03/08/2012 1309      Component Value Date/Time   CALCIUM 8.8 03/08/2012 1309   ALKPHOS 58 03/08/2012 1309   AST 15 03/08/2012 1309   ALT 8 03/08/2012 1309   BILITOT 0.5 03/08/2012 1309     2D Echocardiogram: 12/27/11 Study Conclusions Left ventricle: The cavity size was normal. Wall thickness was normal. Systolic function was normal. The estimated ejection fraction was in the range of 50% to 55%. Wall motion was normal; there were no regional wall motion abnormalities. Left ventricular diastolic function parameters were normal. Transthoracic echocardiography. M-mode, complete 2D, spectral Doppler, and color Doppler. Height: Height: 152.4cm. Height: 60in. Weight: Weight: 41.7kg. Weight: 91.8lb. Body mass index: BMI: 18kg/m^2. Body surface area: BSA: 1.12m^2. Blood pressure: 112/75. Patient status: Outpatient. Location: Echo laboratory. 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 She is now on maintenance every 3 week Herceptin. 2. Chronic GI issues, suggesting possible irritable bowel syndrome. Patient reluctant for gastroenterologist referral, we will try to encourage probiotics.  Case reviewed with Dr. Pierce Crane.  Plan:  [name] will receive Herceptin today as scheduled, Benadryl has been omitted from her premeds. She will have a 2-D echocardiogram obtained prior to her next three-week followup in anticipation of Herceptin dosing. At that time a CBC, chemistry panel, LDH, CA 27-29 will be repeated. She knows to contact us sooner if the need should arise.  Amber Tomas T, PA-C 03/29/2012

## 2012-03-29 NOTE — Telephone Encounter (Signed)
lmonvm adviisng the pt of her echo appt in June at [facility]

## 2012-03-29 NOTE — Patient Instructions (Signed)
[facility] Discharge Instructions for Patients Receiving Chemotherapy  Today you received the following chemotherapy agents Herceptin To help prevent nausea and vomiting after your treatment, we encourage you to take your nausea medication as prescribed.  If you develop nausea and vomiting that is not controlled by your nausea medication, call the clinic. If it is after clinic hours your family physician or the after hours number for the clinic or go to the Emergency Department.   BELOW ARE SYMPTOMS THAT SHOULD BE REPORTED IMMEDIATELY:  *FEVER GREATER THAN 100.5 F  *CHILLS WITH OR WITHOUT FEVER  NAUSEA AND VOMITING THAT IS NOT CONTROLLED WITH YOUR NAUSEA MEDICATION  *UNUSUAL SHORTNESS OF BREATH  *UNUSUAL BRUISING OR BLEEDING  TENDERNESS IN MOUTH AND THROAT WITH OR WITHOUT PRESENCE OF ULCERS  *URINARY PROBLEMS  *BOWEL PROBLEMS  UNUSUAL RASH Items with * indicate a potential emergency and should be followed up as soon as possible.  One of the nurses will contact you 24 hours after your treatment. Please let the nurse know about any problems that you may have experienced. Feel free to call the clinic you have any questions or concerns. The clinic phone number is [phone].   I have been informed and understand all the instructions given to me. I know to contact the clinic, my physician, or go to the Emergency Department if any problems should occur. I do not have any questions at this time, but understand that I may call the clinic during office hours   should I have any questions or need assistance in obtaining follow up care.    __________________________________________  _____________  __________ Signature of Patient or Authorized Representative            Date                   Time    __________________________________________ Nurse's Signature    

## 2012-04-04 ENCOUNTER — Other Ambulatory Visit (HOSPITAL_COMMUNITY): Payer: PRIVATE HEALTH INSURANCE

## 2012-04-16 ENCOUNTER — Ambulatory Visit (HOSPITAL_COMMUNITY)
Admission: RE | Admit: 2012-04-16 | Discharge: 2012-04-16 | Disposition: A | Discharge status: Home / Self Care | Payer: PRIVATE HEALTH INSURANCE | Source: Ambulatory Visit | Attending: Physician Assistant | Admitting: Physician Assistant

## 2012-04-16 ENCOUNTER — Encounter: Payer: Self-pay | Admitting: Oncology

## 2012-04-16 DIAGNOSIS — C50919 Malignant neoplasm of unspecified site of unspecified female breast: Secondary | ICD-10-CM | POA: Insufficient documentation

## 2012-04-16 DIAGNOSIS — Z09 Encounter for follow-up examination after completed treatment for conditions other than malignant neoplasm: Secondary | ICD-10-CM

## 2012-04-16 NOTE — Progress Notes (Signed)
*  PRELIMINARY RESULTS* Echocardiogram 2D Echocardiogram has been performed.  Shubham Thackston 04/16/2012, 3:21 PM

## 2012-04-16 NOTE — Progress Notes (Signed)
Faxed clinical information to [facility], Case Manager, @ [phone] phone # [phone] ext [phone].

## 2012-04-19 ENCOUNTER — Ambulatory Visit (HOSPITAL_BASED_OUTPATIENT_CLINIC_OR_DEPARTMENT_OTHER): Payer: PRIVATE HEALTH INSURANCE

## 2012-04-19 ENCOUNTER — Other Ambulatory Visit (HOSPITAL_BASED_OUTPATIENT_CLINIC_OR_DEPARTMENT_OTHER): Payer: PRIVATE HEALTH INSURANCE | Admitting: Lab

## 2012-04-19 ENCOUNTER — Ambulatory Visit (HOSPITAL_BASED_OUTPATIENT_CLINIC_OR_DEPARTMENT_OTHER): Payer: PRIVATE HEALTH INSURANCE | Admitting: Physician Assistant

## 2012-04-19 VITALS — BP 105/70 | HR 88 | Temp 98.4°F | Ht 61.0 in | Wt 88.8 lb

## 2012-04-19 DIAGNOSIS — C50919 Malignant neoplasm of unspecified site of unspecified female breast: Secondary | ICD-10-CM

## 2012-04-19 DIAGNOSIS — C787 Secondary malignant neoplasm of liver and intrahepatic bile duct: Secondary | ICD-10-CM

## 2012-04-19 DIAGNOSIS — C773 Secondary and unspecified malignant neoplasm of axilla and upper limb lymph nodes: Secondary | ICD-10-CM

## 2012-04-19 DIAGNOSIS — Z5112 Encounter for antineoplastic immunotherapy: Secondary | ICD-10-CM

## 2012-04-19 LAB — CBC WITH DIFFERENTIAL/PLATELET
BASO%: 0.4 % (ref 0.0–2.0)
EOS%: 0.9 % (ref 0.0–7.0)
HCT: 37.7 % (ref 34.8–46.6)
LYMPH%: 33.1 % (ref 14.0–49.7)
MCH: 32.9 pg (ref 25.1–34.0)
MCHC: 35.5 g/dL (ref 31.5–36.0)
MONO%: 5.5 % (ref 0.0–14.0)
NEUT%: 60.1 % (ref 38.4–76.8)
Platelets: 174 10*3/uL (ref 145–400)
RBC: 4.07 10*6/uL (ref 3.70–5.45)

## 2012-04-19 LAB — COMPREHENSIVE METABOLIC PANEL
AST: 16 U/L (ref 0–37)
Alkaline Phosphatase: 67 U/L (ref 39–117)
BUN: 16 mg/dL (ref 6–23)
Calcium: 9.5 mg/dL (ref 8.4–10.5)
Chloride: 106 mEq/L (ref 96–112)
Creatinine, Ser: 0.73 mg/dL (ref 0.50–1.10)
Total Bilirubin: 0.5 mg/dL (ref 0.3–1.2)

## 2012-04-19 MED ORDER — SODIUM CHLORIDE 0.9 % IJ SOLN
10.0000 mL | INTRAMUSCULAR | Status: DC | PRN
  Administered 2012-04-19: 10 mL
  Filled 2012-04-19: qty 10

## 2012-04-19 MED ORDER — TRASTUZUMAB CHEMO INJECTION 440 MG
6.0000 mg/kg | Freq: Once | INTRAVENOUS | Status: AC
  Administered 2012-04-19: 252 mg via INTRAVENOUS
  Filled 2012-04-19: qty 12

## 2012-04-19 MED ORDER — HEPARIN SOD (PORK) LOCK FLUSH 100 UNIT/ML IV SOLN
500.0000 [IU] | Freq: Once | INTRAVENOUS | Status: AC | PRN
  Administered 2012-04-19: 500 [IU]
  Filled 2012-04-19: qty 5

## 2012-04-19 MED ORDER — SODIUM CHLORIDE 0.9 % IV SOLN
Freq: Once | INTRAVENOUS | Status: AC
  Administered 2012-04-19: 15:00:00 via INTRAVENOUS

## 2012-04-19 NOTE — Patient Instructions (Addendum)
[facility] Discharge Instructions for Patients Receiving Chemotherapy  Today you received the following chemotherapy agents Herceptin.  To help prevent nausea and vomiting after your treatment, we encourage you to take your nausea medication.   If you develop nausea and vomiting that is not controlled by your nausea medication, call the clinic. If it is after clinic hours your family physician or the after hours number for the clinic or go to the Emergency Department.   BELOW ARE SYMPTOMS THAT SHOULD BE REPORTED IMMEDIATELY:  *FEVER GREATER THAN 100.5 F  *CHILLS WITH OR WITHOUT FEVER  NAUSEA AND VOMITING THAT IS NOT CONTROLLED WITH YOUR NAUSEA MEDICATION  *UNUSUAL SHORTNESS OF BREATH  *UNUSUAL BRUISING OR BLEEDING  TENDERNESS IN MOUTH AND THROAT WITH OR WITHOUT PRESENCE OF ULCERS  *URINARY PROBLEMS  *BOWEL PROBLEMS  UNUSUAL RASH Items with * indicate a potential emergency and should be followed up as soon as possible.  One of the nurses will contact you 24 hours after your treatment. Please let the nurse know about any problems that you may have experienced. Feel free to call the clinic you have any questions or concerns. The clinic phone number is [phone].   I have been informed and understand all the instructions given to me. I know to contact the clinic, my physician, or go to the Emergency Department if any problems should occur. I do not have any questions at this time, but understand that I may call the clinic during office hours   should I have any questions or need assistance in obtaining follow up care.    __________________________________________  _____________  __________ Signature of Patient or Authorized Representative            Date                   Time    __________________________________________ Nurse's Signature    

## 2012-04-22 NOTE — Progress Notes (Signed)
Hematology and Oncology Follow Up Visit  [name] [id number] [birth year] 46 y.o. 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 She is now on maintenance every 3 week Herceptin.  Interim History:   [name] is seen today with her husband in accompaniment prior to initiating her next Q3 week doses of maintenance Herceptin.  Her overall energy level is actually pretty good.  Repeat 2D Echocardiogram was performed on 04/16/12.  She denies any nausea, her appetite is improved slightly. She is now having relatively normal bowel movements. She is making an effort to drink more water. No fevers chills or night sweats. No shortness of breath or chest pain. No unusual cough. She does smoke but very intermittently. She denies any palpable breast masses, she does not note any unusual pain. Her chronic left upper extremity lymphedema has also improved.A detailed review of systems is otherwise noncontributory as noted below.  Review of Systems: Constitutional:  Fatigue over the past week. Eyes: no complaints ENT: no complains Cardiovascular: no chest pain or dyspnea on exertion Respiratory: no cough, shortness of breath, or wheezing Neurological: no TIA or stroke symptoms Dermatological: negative Gastrointestinal: positive for cramping, swift bowel movements, she also notes that her appetite has been a bit decreased. Genito-Urinary: no dysuria, trouble voiding, or hematuria Hematological and Lymphatic: negative Breast: negative Musculoskeletal: negative Remaining ROS negative.   Medications:   I have reviewed the patient's current medications.  Current Outpatient Prescriptions  Medication Sig Dispense Refill  . acetaminophen (TYLENOL) 325 MG tablet Take 325 mg by mouth every 6 (six) hours as  needed. MAY TAKE 1-2 TABS PRN HA       . Cholecalciferol 1000 UNITS tablet Take 1,000 Units by mouth daily.       Marland Kitchen gabapentin (NEURONTIN) 300 MG capsule Take 300 mg by mouth 3 (three) times daily.      Marland Kitchen lidocaine-prilocaine (EMLA) cream Apply 1 application topically as needed. #60g tube w/3RF-CTS 10/13/11      . LORazepam (ATIVAN) 0.5 MG tablet Take 1 tablet (0.5 mg total) by mouth every 8 (eight) hours. 1-2 po q6-8h prn anxiety/sleep  90 tablet  3  . venlafaxine (EFFEXOR-XR) 37.5 MG 24 hr capsule Take 1 capsule (37.5 mg total) by mouth daily.  30 capsule  3    Allergies:  Allergies  Allergen Reactions  . Effexor Xr (Venlafaxine Hcl Er) Diarrhea  . No Known Allergies     Physical Exam: Filed Vitals:   04/19/12 1333  BP: 105/70  Pulse: 88  Temp: 98.4 F (36.9 C)  Weight: 88 lbs. HEENT:  Sclerae anicteric, conjunctivae pink.  Oropharynx clear.  No mucositis or candidiasis.   Nodes:  No cervical, supraclavicular, or axillary lymphadenopathy palpated.  Breast Exam: Deferred. Lungs:  Clear to auscultation bilaterally.  No crackles, rhonchi, or wheezes.   Heart:  Regular rate and rhythm.   Abdomen:  Soft, nontender.  Positive bowel sounds.  No organomegaly or masses palpated.   Musculoskeletal:  No focal spinal tenderness to palpation.  Extremities:  Benign.  No peripheral edema or cyanosis.   Skin:  Benign.   Neuro:  Nonfocal, alert and oriented x 3.   Lab Results: Lab Results  Component Value Date   WBC 5.4 04/19/2012   HGB 13.4 04/19/2012   HCT 37.7 04/19/2012   MCV 92.6 04/19/2012  PLT 174 04/19/2012   NEUTROABS 3.3 04/19/2012     Chemistry      Component Value Date/Time   NA 144 04/19/2012 1322   K 3.6 04/19/2012 1322   CL 106 04/19/2012 1322   CO2 30 04/19/2012 1322   BUN 16 04/19/2012 1322   CREATININE 0.73 04/19/2012 1322      Component Value Date/Time   CALCIUM 9.5 04/19/2012 1322   ALKPHOS 67 04/19/2012 1322   AST 16 04/19/2012 1322   ALT 13 04/19/2012 1322    BILITOT 0.5 04/19/2012 1322     2D Echocardiogram: 04/16/12 Study Conclusions Left ventricle: The cavity size was normal. Wall thickness was normal. Systolic function was normal. The estimated ejection fraction was in the range of 55% to 60%. Wall motion was normal; there were no regional wall motion abnormalities. Left ventricular diastolic function parameters were normal. Transthoracic echocardiography. M-mode, limited 2D, limited spectral Doppler, and color Doppler. Height: Height: 152.4cm. Height: 60in. Weight: Weight: 42.7kg. Weight: 94lb. Body mass index: BMI: 18.4kg/m^2. Body surface area: BSA: 1.19m^2. Blood pressure: 124/82. Patient status: Outpatient. Location: Echo laboratory. 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 She is now on maintenance every 3 week Herceptin.  Case reviewed with Dr. Pierce Crane.  Plan:  [name] will receive Herceptin today as scheduled, Benadryl has been omitted from her premeds. She will return in three-weeks for followup in anticipation of Herceptin dosing. At that time a CBC, chemistry panel, LDH, CA 27-29 will be repeated. She knows to contact us sooner if the need should arise.  Azaiah Licciardi T, PA-C 04/19/12

## 2012-05-10 ENCOUNTER — Ambulatory Visit: Payer: PRIVATE HEALTH INSURANCE

## 2012-05-30 ENCOUNTER — Other Ambulatory Visit: Payer: Self-pay | Admitting: Family

## 2012-05-30 DIAGNOSIS — C50919 Malignant neoplasm of unspecified site of unspecified female breast: Secondary | ICD-10-CM

## 2012-05-31 ENCOUNTER — Ambulatory Visit (HOSPITAL_BASED_OUTPATIENT_CLINIC_OR_DEPARTMENT_OTHER): Payer: PRIVATE HEALTH INSURANCE | Admitting: Family

## 2012-05-31 ENCOUNTER — Encounter: Payer: Self-pay | Admitting: Family

## 2012-05-31 ENCOUNTER — Ambulatory Visit (HOSPITAL_BASED_OUTPATIENT_CLINIC_OR_DEPARTMENT_OTHER): Payer: PRIVATE HEALTH INSURANCE

## 2012-05-31 ENCOUNTER — Other Ambulatory Visit (HOSPITAL_BASED_OUTPATIENT_CLINIC_OR_DEPARTMENT_OTHER): Payer: PRIVATE HEALTH INSURANCE | Admitting: Lab

## 2012-05-31 ENCOUNTER — Telehealth: Payer: Self-pay | Admitting: Oncology

## 2012-05-31 VITALS — BP 117/77 | HR 90 | Temp 98.2°F | Resp 20 | Ht 61.0 in | Wt 83.9 lb

## 2012-05-31 DIAGNOSIS — C50919 Malignant neoplasm of unspecified site of unspecified female breast: Secondary | ICD-10-CM

## 2012-05-31 DIAGNOSIS — C773 Secondary and unspecified malignant neoplasm of axilla and upper limb lymph nodes: Secondary | ICD-10-CM

## 2012-05-31 DIAGNOSIS — C787 Secondary malignant neoplasm of liver and intrahepatic bile duct: Secondary | ICD-10-CM

## 2012-05-31 DIAGNOSIS — Z5112 Encounter for antineoplastic immunotherapy: Secondary | ICD-10-CM

## 2012-05-31 DIAGNOSIS — R634 Abnormal weight loss: Secondary | ICD-10-CM

## 2012-05-31 LAB — CBC WITH DIFFERENTIAL/PLATELET
BASO%: 0.6 % (ref 0.0–2.0)
EOS%: 0.9 % (ref 0.0–7.0)
HCT: 39.2 % (ref 34.8–46.6)
MCH: 32.9 pg (ref 25.1–34.0)
MCHC: 33.7 g/dL (ref 31.5–36.0)
MONO#: 0.2 10*3/uL (ref 0.1–0.9)
NEUT%: 65.1 % (ref 38.4–76.8)
RDW: 12.2 % (ref 11.2–14.5)
WBC: 4.6 10*3/uL (ref 3.9–10.3)
lymph#: 1.3 10*3/uL (ref 0.9–3.3)

## 2012-05-31 LAB — CANCER ANTIGEN 27.29: CA 27.29: 21 U/mL (ref 0–39)

## 2012-05-31 MED ORDER — SODIUM CHLORIDE 0.9 % IV SOLN
Freq: Once | INTRAVENOUS | Status: AC
  Administered 2012-05-31: 16:00:00 via INTRAVENOUS

## 2012-05-31 MED ORDER — TRASTUZUMAB CHEMO INJECTION 440 MG
6.0000 mg/kg | Freq: Once | INTRAVENOUS | Status: AC
  Administered 2012-05-31: 231 mg via INTRAVENOUS
  Filled 2012-05-31: qty 11

## 2012-05-31 MED ORDER — HEPARIN SOD (PORK) LOCK FLUSH 100 UNIT/ML IV SOLN
500.0000 [IU] | Freq: Once | INTRAVENOUS | Status: AC | PRN
  Administered 2012-05-31: 500 [IU]
  Filled 2012-05-31: qty 5

## 2012-05-31 MED ORDER — SODIUM CHLORIDE 0.9 % IJ SOLN
10.0000 mL | INTRAMUSCULAR | Status: DC | PRN
  Administered 2012-05-31: 10 mL
  Filled 2012-05-31: qty 10

## 2012-05-31 MED ORDER — MIRTAZAPINE 15 MG PO TABS: 15.0000 mg | ORAL_TABLET | Freq: Every day | ORAL | Status: DC

## 2012-05-31 MED ORDER — TRASTUZUMAB CHEMO INJECTION 440 MG: 6.0000 mg/kg | Freq: Once | INTRAVENOUS | Status: DC

## 2012-05-31 NOTE — Patient Instructions (Signed)
Return in 3 weeks for next treatment.  Begin Remeron nightly.

## 2012-05-31 NOTE — Progress Notes (Signed)
Hematology and Oncology Follow Up Visit  [name] [id number] 10-14-1966 46 y.o. 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   CURRENT THERAPY: Q3 week maintenance Herceptin.   Interim History:   [name] is seen today with her husband, here for Herceptin.  Overall energy level is actually pretty good.  She has a young son and is busy with his activities. Last echo was 04/16/12.  No headache or blurred vision. No cough or shortness of breath. No abdominal pain or new bone pain. Bowel and bladder function are normal. Appetite is good, with adequate fluid intake. She expresses concern over 3 lb weight loss since last visit. Reports periodic mild pain related to placement of right subclavian port a cath, overlying the right clavicle. No upper extremity edema.   Her husband is concerned that she is depressed and has "given up". She denies depression or thoughts of harming herself. Remainder of the 10 point  review of systems is negative.movements.    MEDICATIONS: I have reviewed her current medications. No changes since we saw her last.   Allergies:  Allergies  Allergen Reactions  . Effexor Xr (Venlafaxine Hcl Er) Diarrhea  . No Known Allergies     Physical Exam: Filed Vitals:   05/31/12 1341  BP: 117/77  Pulse: 90  Temp: 98.2 F (36.8 C)  Resp: 20  Weight: 88 lbs. HEENT:  Sclerae anicteric, conjunctivae pink.  Oropharynx clear.  No mucositis or candidiasis.   Nodes:  No cervical, supraclavicular, or axillary lymphadenopathy palpated.  Lungs:  Clear to auscultation bilaterally.  No crackles, rhonchi, or wheezes.   Heart:  Regular rate and rhythm.   Abdomen:  Soft, nontender.  Positive bowel sounds.  No organomegaly or masses palpated.   Musculoskeletal:  No focal spinal  tenderness to palpation.  Extremities:  Benign.  No peripheral edema or cyanosis.   Skin:  Benign.   Neuro:  Nonfocal, alert and oriented x 3. Beast: BREAST EXAM: In the supine position, with the right arm over the head, the right nipple is everted. No periareolar edema or nipple discharge. No mass in any quadrant or subareolar region. No redness of the skin. No right axillary adenopathy. Right subclavian port, overlying the clavicle. Skin is taut. With the left arm over the head, the left nipple is everted. No periareolar edema or nipple discharge. No mass in any quadrant or subareolar region. Well healed scar in the 11-1 o'clock position. No redness of the skin. No left axillary adenopathy.    Lab Results: Lab Results  Component Value Date   WBC 4.6 05/31/2012   HGB 13.2 05/31/2012   HCT 39.2 05/31/2012   MCV 97.9 05/31/2012   PLT 138* 05/31/2012   NEUTROABS 3.0 05/31/2012    Assessment:  1. Recurrent HER-2 positive, ER/PR negative breast carcinoma with involvement of the ipsilateral breast, left axillary lymph nodes, and known history of liver involvement. She is now on maintenance every 3 week Herceptin with good tolerance.  2. Echo June 25 was normal.  3. Continued weight loss. 4. Concern from family member regarding depression.    Plan:  1. Herceptin today.  2. Return in three-weeks for followup and next Herceptin dosing. She knows to contact us sooner if the need should arise. 3. Prescription for Remeron for appetite enhancement.  4. Will order PET  on next visit for restaging.   Amber Rosenwald, FNP-C

## 2012-05-31 NOTE — Telephone Encounter (Signed)
gve the pt her aug 2013 appt calendar. Pt is aware that her chemo appt will be added.

## 2012-05-31 NOTE — Patient Instructions (Addendum)
[facility] Center Discharge Instructions for Patients Receiving Chemotherapy  Today you received the following chemotherapy agents Herceptin  To help prevent nausea and vomiting after your treatment, we encourage you to take your nausea medication Begin taking it at 7 pm and take it as often as prescribed for the next 24 to 72 hours.   If you develop nausea and vomiting that is not controlled by your nausea medication, call the clinic. If it is after clinic hours your family physician or the after hours number for the clinic or go to the Emergency Department.   BELOW ARE SYMPTOMS THAT SHOULD BE REPORTED IMMEDIATELY:  *FEVER GREATER THAN 100.5 F  *CHILLS WITH OR WITHOUT FEVER  NAUSEA AND VOMITING THAT IS NOT CONTROLLED WITH YOUR NAUSEA MEDICATION  *UNUSUAL SHORTNESS OF BREATH  *UNUSUAL BRUISING OR BLEEDING  TENDERNESS IN MOUTH AND THROAT WITH OR WITHOUT PRESENCE OF ULCERS  *URINARY PROBLEMS  *BOWEL PROBLEMS  UNUSUAL RASH Items with * indicate a potential emergency and should be followed up as soon as possible.  One of the nurses will contact you 24 hours after your treatment. Please let the nurse know about any problems that you may have experienced. Feel free to call the clinic you have any questions or concerns. The clinic phone number is [phone].   I have been informed and understand all the instructions given to me. I know to contact the clinic, my physician, or go to the Emergency Department if any problems should occur. I do not have any questions at this time, but understand that I may call the clinic during office hours   should I have any questions or need assistance in obtaining follow up care.    __________________________________________  _____________  __________ Signature of Patient or Authorized Representative            Date                   Time    __________________________________________ Nurse's Signature    

## 2012-06-21 ENCOUNTER — Other Ambulatory Visit: Payer: Self-pay | Admitting: Emergency Medicine

## 2012-06-21 ENCOUNTER — Ambulatory Visit (HOSPITAL_BASED_OUTPATIENT_CLINIC_OR_DEPARTMENT_OTHER): Payer: PRIVATE HEALTH INSURANCE | Admitting: Family

## 2012-06-21 ENCOUNTER — Encounter: Payer: Self-pay | Admitting: Family

## 2012-06-21 ENCOUNTER — Ambulatory Visit (HOSPITAL_BASED_OUTPATIENT_CLINIC_OR_DEPARTMENT_OTHER): Payer: PRIVATE HEALTH INSURANCE

## 2012-06-21 ENCOUNTER — Other Ambulatory Visit: Payer: PRIVATE HEALTH INSURANCE | Admitting: Lab

## 2012-06-21 ENCOUNTER — Telehealth: Payer: Self-pay | Admitting: *Deleted

## 2012-06-21 VITALS — BP 110/74 | HR 82 | Temp 98.8°F | Resp 20 | Ht 61.0 in | Wt 85.4 lb

## 2012-06-21 DIAGNOSIS — Z5112 Encounter for antineoplastic immunotherapy: Secondary | ICD-10-CM

## 2012-06-21 DIAGNOSIS — C773 Secondary and unspecified malignant neoplasm of axilla and upper limb lymph nodes: Secondary | ICD-10-CM

## 2012-06-21 DIAGNOSIS — C50919 Malignant neoplasm of unspecified site of unspecified female breast: Secondary | ICD-10-CM

## 2012-06-21 DIAGNOSIS — Z171 Estrogen receptor negative status [ER-]: Secondary | ICD-10-CM

## 2012-06-21 DIAGNOSIS — C787 Secondary malignant neoplasm of liver and intrahepatic bile duct: Secondary | ICD-10-CM

## 2012-06-21 MED ORDER — SODIUM CHLORIDE 0.9 % IV SOLN: Freq: Once | INTRAVENOUS | Status: DC

## 2012-06-21 MED ORDER — DIPHENHYDRAMINE HCL 25 MG PO CAPS: 50.0000 mg | ORAL_CAPSULE | Freq: Once | ORAL | Status: DC

## 2012-06-21 MED ORDER — ACETAMINOPHEN 325 MG PO TABS: 650.0000 mg | ORAL_TABLET | Freq: Once | ORAL | Status: DC

## 2012-06-21 MED ORDER — TRASTUZUMAB CHEMO INJECTION 440 MG
6.0000 mg/kg | Freq: Once | INTRAVENOUS | Status: AC
  Administered 2012-06-21: 231 mg via INTRAVENOUS
  Filled 2012-06-21: qty 11

## 2012-06-21 NOTE — Progress Notes (Signed)
Hematology and Oncology Follow Up Visit  [name] [id number] Feb 27, 1966 46 y.o. 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   CURRENT THERAPY: Q3 week maintenance Herceptin.   Interim History:   [name] is seen today alone, here for Herceptin.  Overall energy level is actually pretty good.  Good tolerance of Remeron ordered on last visit. She is happy to report a 2 lb weight gain. Last echo was 04/16/12.  No headache or blurred vision. No cough or shortness of breath. No abdominal pain or new bone pain. Bowel and bladder function are normal. Appetite is good, with adequate fluid intake. She expresses concern over 3 lb weight loss since last visit. Reports periodic mild pain related to placement of right subclavian port a cath, overlying the right clavicle. No upper extremity edema.   Remainder of the 10 point  review of systems is negative.movements.    MEDICATIONS: I have reviewed her current medications. No changes since we saw her last.   Allergies:  Allergies  Allergen Reactions  . Effexor Xr (Venlafaxine Hcl Er) Diarrhea  . No Known Allergies     Physical Exam: Filed Vitals:   06/21/12 1407  BP: 110/74  Pulse: 82  Temp: 98.8 F (37.1 C)  Resp: 20  Weight: 88 lbs. HEENT:  Sclerae anicteric, conjunctivae pink.  Oropharynx clear.  No mucositis or candidiasis.   Nodes:  No cervical, supraclavicular, or axillary lymphadenopathy palpated.  Lungs:  Clear to auscultation bilaterally.  No crackles, rhonchi, or wheezes.   Heart:  Regular rate and rhythm.   Abdomen:  Soft, nontender.  Positive bowel sounds.  No organomegaly or masses palpated.   Musculoskeletal:  No focal spinal tenderness to palpation.  Extremities:  Benign.  No peripheral edema or cyanosis.   Skin:  Benign.     Neuro:  Nonfocal, alert and oriented x 3.  Lab Results: Lab Results  Component Value Date   WBC 4.6 05/31/2012   HGB 13.2 05/31/2012   HCT 39.2 05/31/2012   MCV 97.9 05/31/2012   PLT 138* 05/31/2012   NEUTROABS 3.0 05/31/2012    Assessment:  1. Recurrent HER-2 positive, ER/PR negative breast carcinoma with involvement of the ipsilateral breast, left axillary lymph nodes, and known history of liver involvement. She is now on maintenance every 3 week Herceptin with good tolerance.  2. Echo June 25 was normal.  3. 2 lb weight gain on Remeron. . 4. Denies depression.    Plan:  1. Herceptin today.  2. Return in three-weeks for followup and next Herceptin dosing. She knows to contact us sooner if the need should arise. 3. Continue Remeron for appetite enhancement.  4. Will order PET on next visit for restaging.   Amber Febres, FNP-C

## 2012-06-21 NOTE — Patient Instructions (Addendum)
1. Herceptin today.  2. Return in three-weeks for followup and next Herceptin dosing. She knows to contact us sooner if the need should arise. 3. Continue Remeron for appetite enhancement.  4. Will order PET on next visit for restaging.

## 2012-06-21 NOTE — Telephone Encounter (Signed)
Gave patient appointment for 07-12-2012 starting at 2:15pm 08-02-2012 starting at 2:00pm lab md infusion

## 2012-06-21 NOTE — Patient Instructions (Signed)
[name] [birth year] [id number]  [facility] Discharge Instructions for Patients Receiving Chemotherapy  Today you received the following chemotherapy agents: Herceptin   To help prevent nausea and vomiting after your treatment, we encourage you to take your nausea medication as prescribed; if you develop nausea and vomiting that is not controlled by your nausea medication, call the clinic.   BELOW ARE SYMPTOMS THAT SHOULD BE REPORTED IMMEDIATELY:  *FEVER GREATER THAN 100.5 F *CHILLS WITH OR WITHOUT FEVER *UNUSUAL SHORTNESS OF BREATH *UNUSUAL BRUISING OR BLEEDING *URINARY PROBLEMS *BOWEL PROBLEMS  I have been informed and understand all the instructions given to me.   Please call the [facility] at [phone] during business hours should you have any further questions or need assistance in obtaining follow-up care. If you have a medical emergency, please dial 911.

## 2012-07-12 ENCOUNTER — Ambulatory Visit (HOSPITAL_BASED_OUTPATIENT_CLINIC_OR_DEPARTMENT_OTHER): Payer: PRIVATE HEALTH INSURANCE

## 2012-07-12 ENCOUNTER — Other Ambulatory Visit: Payer: Self-pay | Admitting: Oncology

## 2012-07-12 ENCOUNTER — Other Ambulatory Visit (HOSPITAL_BASED_OUTPATIENT_CLINIC_OR_DEPARTMENT_OTHER): Payer: PRIVATE HEALTH INSURANCE | Admitting: Lab

## 2012-07-12 ENCOUNTER — Other Ambulatory Visit: Payer: Self-pay | Admitting: Emergency Medicine

## 2012-07-12 VITALS — BP 107/63 | HR 92 | Temp 98.4°F | Resp 18

## 2012-07-12 DIAGNOSIS — C50919 Malignant neoplasm of unspecified site of unspecified female breast: Secondary | ICD-10-CM

## 2012-07-12 DIAGNOSIS — C787 Secondary malignant neoplasm of liver and intrahepatic bile duct: Secondary | ICD-10-CM

## 2012-07-12 DIAGNOSIS — C773 Secondary and unspecified malignant neoplasm of axilla and upper limb lymph nodes: Secondary | ICD-10-CM

## 2012-07-12 DIAGNOSIS — Z5112 Encounter for antineoplastic immunotherapy: Secondary | ICD-10-CM

## 2012-07-12 LAB — CBC WITH DIFFERENTIAL/PLATELET
BASO%: 0.3 % (ref 0.0–2.0)
Eosinophils Absolute: 0.1 10*3/uL (ref 0.0–0.5)
MCHC: 35.1 g/dL (ref 31.5–36.0)
MCV: 93.4 fL (ref 79.5–101.0)
MONO%: 5.5 % (ref 0.0–14.0)
NEUT#: 4.2 10*3/uL (ref 1.5–6.5)
RBC: 4.39 10*6/uL (ref 3.70–5.45)
RDW: 12.2 % (ref 11.2–14.5)
WBC: 7.1 10*3/uL (ref 3.9–10.3)
nRBC: 0 % (ref 0–0)

## 2012-07-12 LAB — COMPREHENSIVE METABOLIC PANEL (CC13)
ALT: 10 U/L (ref 0–55)
Alkaline Phosphatase: 59 U/L (ref 40–150)
Creatinine: 0.6 mg/dL (ref 0.6–1.1)
Glucose: 68 mg/dl — ABNORMAL LOW (ref 70–99)
Sodium: 145 mEq/L (ref 136–145)
Total Bilirubin: 0.2 mg/dL (ref 0.20–1.20)
Total Protein: 4.6 g/dL — ABNORMAL LOW (ref 6.4–8.3)

## 2012-07-12 LAB — LACTATE DEHYDROGENASE (CC13): LDH: 141 U/L (ref 125–220)

## 2012-07-12 MED ORDER — SODIUM CHLORIDE 0.9 % IJ SOLN
10.0000 mL | INTRAMUSCULAR | Status: DC | PRN
  Administered 2012-07-12: 10 mL
  Filled 2012-07-12: qty 10

## 2012-07-12 MED ORDER — HEPARIN SOD (PORK) LOCK FLUSH 100 UNIT/ML IV SOLN
500.0000 [IU] | Freq: Once | INTRAVENOUS | Status: AC | PRN
  Administered 2012-07-12: 500 [IU]
  Filled 2012-07-12: qty 5

## 2012-07-12 MED ORDER — SODIUM CHLORIDE 0.9 % IV SOLN
Freq: Once | INTRAVENOUS | Status: AC
  Administered 2012-07-12: 15:00:00 via INTRAVENOUS

## 2012-07-12 MED ORDER — ACETAMINOPHEN 325 MG PO TABS: 650.0000 mg | ORAL_TABLET | Freq: Once | ORAL | Status: DC

## 2012-07-12 MED ORDER — DIPHENHYDRAMINE HCL 25 MG PO CAPS: 50.0000 mg | ORAL_CAPSULE | Freq: Once | ORAL | Status: DC

## 2012-07-12 MED ORDER — TRASTUZUMAB CHEMO INJECTION 440 MG
6.0000 mg/kg | Freq: Once | INTRAVENOUS | Status: AC
  Administered 2012-07-12: 231 mg via INTRAVENOUS
  Filled 2012-07-12: qty 11

## 2012-07-12 MED ORDER — TRASTUZUMAB CHEMO INJECTION 440 MG: 6.0000 mg/kg | Freq: Once | INTRAVENOUS | Status: DC

## 2012-07-12 NOTE — Patient Instructions (Signed)
Patient aware of next appointment; discharged home with no complaints. 

## 2012-07-13 LAB — CANCER ANTIGEN 27.29: CA 27.29: 15 U/mL (ref 0–39)

## 2012-07-24 ENCOUNTER — Other Ambulatory Visit: Payer: Self-pay | Admitting: Oncology

## 2012-07-24 DIAGNOSIS — Z9889 Other specified postprocedural states: Secondary | ICD-10-CM

## 2012-07-24 DIAGNOSIS — Z853 Personal history of malignant neoplasm of breast: Secondary | ICD-10-CM

## 2012-08-01 ENCOUNTER — Other Ambulatory Visit: Payer: Self-pay | Admitting: *Deleted

## 2012-08-01 DIAGNOSIS — C50919 Malignant neoplasm of unspecified site of unspecified female breast: Secondary | ICD-10-CM

## 2012-08-02 ENCOUNTER — Other Ambulatory Visit (HOSPITAL_BASED_OUTPATIENT_CLINIC_OR_DEPARTMENT_OTHER): Payer: PRIVATE HEALTH INSURANCE

## 2012-08-02 ENCOUNTER — Telehealth: Payer: Self-pay | Admitting: Oncology

## 2012-08-02 ENCOUNTER — Ambulatory Visit (HOSPITAL_BASED_OUTPATIENT_CLINIC_OR_DEPARTMENT_OTHER): Payer: PRIVATE HEALTH INSURANCE

## 2012-08-02 ENCOUNTER — Ambulatory Visit
Admission: RE | Admit: 2012-08-02 | Discharge: 2012-08-02 | Disposition: A | Discharge status: Home / Self Care | Payer: PRIVATE HEALTH INSURANCE | Source: Ambulatory Visit | Attending: Oncology | Admitting: Oncology

## 2012-08-02 ENCOUNTER — Ambulatory Visit (HOSPITAL_BASED_OUTPATIENT_CLINIC_OR_DEPARTMENT_OTHER): Payer: PRIVATE HEALTH INSURANCE | Admitting: Oncology

## 2012-08-02 VITALS — BP 103/67 | HR 103 | Temp 98.9°F | Resp 20 | Ht 61.0 in | Wt 85.8 lb

## 2012-08-02 DIAGNOSIS — C787 Secondary malignant neoplasm of liver and intrahepatic bile duct: Secondary | ICD-10-CM

## 2012-08-02 DIAGNOSIS — C50919 Malignant neoplasm of unspecified site of unspecified female breast: Secondary | ICD-10-CM

## 2012-08-02 DIAGNOSIS — Z171 Estrogen receptor negative status [ER-]: Secondary | ICD-10-CM

## 2012-08-02 DIAGNOSIS — Z853 Personal history of malignant neoplasm of breast: Secondary | ICD-10-CM

## 2012-08-02 DIAGNOSIS — Z9889 Other specified postprocedural states: Secondary | ICD-10-CM

## 2012-08-02 DIAGNOSIS — Z5112 Encounter for antineoplastic immunotherapy: Secondary | ICD-10-CM

## 2012-08-02 DIAGNOSIS — C773 Secondary and unspecified malignant neoplasm of axilla and upper limb lymph nodes: Secondary | ICD-10-CM

## 2012-08-02 LAB — CBC WITH DIFFERENTIAL/PLATELET
BASO%: 0.3 % (ref 0.0–2.0)
Basophils Absolute: 0 10*3/uL (ref 0.0–0.1)
EOS%: 0.9 % (ref 0.0–7.0)
HCT: 38.6 % (ref 34.8–46.6)
MCH: 33.1 pg (ref 25.1–34.0)
MCHC: 35.2 g/dL (ref 31.5–36.0)
MCV: 93.9 fL (ref 79.5–101.0)
MONO%: 7.9 % (ref 0.0–14.0)
NEUT%: 73.8 % (ref 38.4–76.8)
RDW: 12.2 % (ref 11.2–14.5)
lymph#: 1.1 10*3/uL (ref 0.9–3.3)

## 2012-08-02 LAB — COMPREHENSIVE METABOLIC PANEL
AST: 15 U/L (ref 0–37)
Albumin: 3.4 g/dL — ABNORMAL LOW (ref 3.5–5.2)
Alkaline Phosphatase: 81 U/L (ref 39–117)
BUN: 11 mg/dL (ref 6–23)
Calcium: 9.2 mg/dL (ref 8.4–10.5)
Chloride: 100 mEq/L (ref 96–112)
Glucose, Bld: 98 mg/dL (ref 70–99)
Potassium: 3.7 mEq/L (ref 3.5–5.3)
Sodium: 138 mEq/L (ref 135–145)
Total Protein: 6.5 g/dL (ref 6.0–8.3)

## 2012-08-02 MED ORDER — ACETAMINOPHEN 325 MG PO TABS: 650.0000 mg | ORAL_TABLET | Freq: Once | ORAL | Status: DC

## 2012-08-02 MED ORDER — DIPHENHYDRAMINE HCL 25 MG PO CAPS: 50.0000 mg | ORAL_CAPSULE | Freq: Once | ORAL | Status: DC

## 2012-08-02 MED ORDER — TRASTUZUMAB CHEMO INJECTION 440 MG
6.0000 mg/kg | Freq: Once | INTRAVENOUS | Status: AC
  Administered 2012-08-02: 231 mg via INTRAVENOUS
  Filled 2012-08-02: qty 11

## 2012-08-02 MED ORDER — SODIUM CHLORIDE 0.9 % IV SOLN: Freq: Once | INTRAVENOUS | Status: DC

## 2012-08-02 MED ORDER — HEPARIN SOD (PORK) LOCK FLUSH 100 UNIT/ML IV SOLN
500.0000 [IU] | Freq: Once | INTRAVENOUS | Status: DC | PRN
  Filled 2012-08-02: qty 5

## 2012-08-02 MED ORDER — SODIUM CHLORIDE 0.9 % IJ SOLN
10.0000 mL | INTRAMUSCULAR | Status: DC | PRN
  Filled 2012-08-02: qty 10

## 2012-08-02 NOTE — Telephone Encounter (Signed)
gve the pt her nov 2013 appt calendar along with the ct scan appt with instructions.

## 2012-08-02 NOTE — Telephone Encounter (Signed)
gve the pt her nov 2013 appt calendar along with the ct scan appts with instructions.

## 2012-08-02 NOTE — Patient Instructions (Addendum)
[facility] Discharge Instructions for Patients Receiving Chemotherapy  Today you received the following chemotherapy agents Herceptin To help prevent nausea and vomiting after your treatment, we encourage you to take your nausea medication as prescribed.  If you develop nausea and vomiting that is not controlled by your nausea medication, call the clinic. If it is after clinic hours your family physician or the after hours number for the clinic or go to the Emergency Department.   BELOW ARE SYMPTOMS THAT SHOULD BE REPORTED IMMEDIATELY:  *FEVER GREATER THAN 100.5 F  *CHILLS WITH OR WITHOUT FEVER  NAUSEA AND VOMITING THAT IS NOT CONTROLLED WITH YOUR NAUSEA MEDICATION  *UNUSUAL SHORTNESS OF BREATH  *UNUSUAL BRUISING OR BLEEDING  TENDERNESS IN MOUTH AND THROAT WITH OR WITHOUT PRESENCE OF ULCERS  *URINARY PROBLEMS  *BOWEL PROBLEMS  UNUSUAL RASH Items with * indicate a potential emergency and should be followed up as soon as possible.  One of the nurses will contact you 24 hours after your treatment. Please let the nurse know about any problems that you may have experienced. Feel free to call the clinic you have any questions or concerns. The clinic phone number is [phone].   I have been informed and understand all the instructions given to me. I know to contact the clinic, my physician, or go to the Emergency Department if any problems should occur. I do not have any questions at this time, but understand that I may call the clinic during office hours   should I have any questions or need assistance in obtaining follow up care.    __________________________________________  _____________  __________ Signature of Patient or Authorized Representative            Date                   Time    __________________________________________ Nurse's Signature    

## 2012-08-04 NOTE — Progress Notes (Signed)
Hematology and Oncology Follow Up Visit  [name] [id number] 29-Jun-1966 46 y.o. 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   CURRENT THERAPY: Q3 week maintenance Herceptin.   Interim History:   [name] is seen today alone, here for Herceptin.  Overall energy level is actually pretty good.  Good tolerance of Remeron ordered on last visit. She is happy to report a 2 lb weight gain. Last echo was 04/16/12.  No headache or blurred vision. No cough or shortness of breath. No abdominal pain or new bone pain. Bowel and bladder function are normal. Appetite is good, with adequate fluid intake. She has a reading about 2 pounds from her last visit. She feels pretty well she is little despondent appearing. She has no other complaints. She will be further 2-D echo as well as restaging CAT scan that she's not having her in some time. She did have a mammogram today yesterday which was within normal limits. Remainder of the 10 point  review of systems is negative.movements.    MEDICATIONS: I have reviewed her current medications. No changes since we saw her last.   Allergies:  Allergies  Allergen Reactions  . Effexor Xr (Venlafaxine Hcl Er) Diarrhea  . No Known Allergies     Physical Exam: Filed Vitals:   08/02/12 1407  BP: 103/67  Pulse: 103  Temp: 98.9 F (37.2 C)  Resp: 20  Weight: 88 lbs. HEENT:  Sclerae anicteric, conjunctivae pink.  Oropharynx clear.  No mucositis or candidiasis.   Nodes:  No cervical, supraclavicular, or axillary lymphadenopathy palpated.  Lungs:  Clear to auscultation bilaterally.  No crackles, rhonchi, or wheezes.   Heart:  Regular rate and rhythm.   Abdomen:  Soft, nontender.  Positive bowel sounds.  No organomegaly or masses palpated.   Musculoskeletal:  No  focal spinal tenderness to palpation.  Extremities:  Benign.  No peripheral edema or cyanosis.   Skin:  Benign.   Neuro:  Nonfocal, alert and oriented x 3.  Lab Results: Lab Results  Component Value Date   WBC 6.4 08/02/2012   HGB 13.6 08/02/2012   HCT 38.6 08/02/2012   MCV 93.9 08/02/2012   PLT 192 08/02/2012   NEUTROABS 4.8 08/02/2012    Assessment:  1. Recurrent HER-2 positive, ER/PR negative breast carcinoma with involvement of the ipsilateral breast, left axillary lymph nodes, and known history of liver involvement. She is now on maintenance every 3 week Herceptin with good tolerance. .    Plan:  1. Herceptin today.  2. Return in three-weeks for followup and next Herceptin dosing. She knows to contact us sooner if the need should arise. Reimaging studies.  Pierce Crane, MD

## 2012-08-06 ENCOUNTER — Encounter: Payer: Self-pay | Admitting: Oncology

## 2012-08-06 NOTE — Progress Notes (Signed)
08/06/2012 I spoke with this patient, RE: chest, abd and pelvis ct scans have  not been approved, I have cancelled these scans and will reschedule with central scheduling @ [facility] once I receive approval.  Patient voiced understanding of this situation because she had no desire to pay for them out of pocket.  I have contacted her insurance company three times today only not to be able to speak to a live person only could leave a voice message.  Bonita Quin [phone]

## 2012-08-07 ENCOUNTER — Telehealth: Payer: Self-pay | Admitting: Oncology

## 2012-08-07 ENCOUNTER — Inpatient Hospital Stay (HOSPITAL_COMMUNITY)
Admission: RE | Admit: 2012-08-07 | Discharge: 2012-08-07 | Payer: PRIVATE HEALTH INSURANCE | Source: Ambulatory Visit | Attending: Oncology | Admitting: Oncology

## 2012-08-07 ENCOUNTER — Other Ambulatory Visit: Payer: Self-pay | Admitting: *Deleted

## 2012-08-07 DIAGNOSIS — C50919 Malignant neoplasm of unspecified site of unspecified female breast: Secondary | ICD-10-CM

## 2012-08-07 NOTE — Telephone Encounter (Signed)
S/w the pt and she is aware of her echo appt on 08/20/2012@[facility]

## 2012-08-14 ENCOUNTER — Ambulatory Visit (HOSPITAL_COMMUNITY)
Admission: RE | Admit: 2012-08-14 | Discharge: 2012-08-14 | Disposition: A | Discharge status: Home / Self Care | Payer: PRIVATE HEALTH INSURANCE | Source: Ambulatory Visit | Attending: Oncology | Admitting: Oncology

## 2012-08-14 DIAGNOSIS — C50919 Malignant neoplasm of unspecified site of unspecified female breast: Secondary | ICD-10-CM | POA: Insufficient documentation

## 2012-08-14 DIAGNOSIS — Z1289 Encounter for screening for malignant neoplasm of other sites: Secondary | ICD-10-CM | POA: Insufficient documentation

## 2012-08-14 MED ORDER — IOHEXOL 300 MG/ML  SOLN
100.0000 mL | Freq: Once | INTRAMUSCULAR | Status: AC | PRN
  Administered 2012-08-14: 100 mL via INTRAVENOUS

## 2012-08-15 ENCOUNTER — Other Ambulatory Visit: Payer: Self-pay | Admitting: Physician Assistant

## 2012-08-15 DIAGNOSIS — C50919 Malignant neoplasm of unspecified site of unspecified female breast: Secondary | ICD-10-CM

## 2012-08-20 ENCOUNTER — Ambulatory Visit (HOSPITAL_COMMUNITY)
Admission: RE | Admit: 2012-08-20 | Discharge: 2012-08-20 | Disposition: A | Discharge status: Home / Self Care | Payer: PRIVATE HEALTH INSURANCE | Source: Ambulatory Visit | Attending: Oncology | Admitting: Oncology

## 2012-08-20 DIAGNOSIS — F17200 Nicotine dependence, unspecified, uncomplicated: Secondary | ICD-10-CM | POA: Insufficient documentation

## 2012-08-20 DIAGNOSIS — C50919 Malignant neoplasm of unspecified site of unspecified female breast: Secondary | ICD-10-CM | POA: Insufficient documentation

## 2012-08-20 DIAGNOSIS — Z09 Encounter for follow-up examination after completed treatment for conditions other than malignant neoplasm: Secondary | ICD-10-CM

## 2012-08-20 NOTE — Progress Notes (Signed)
Echocardiogram 2D Echocardiogram has been performed.  Son Barkan 08/20/2012, 11:30 AM

## 2012-08-23 ENCOUNTER — Other Ambulatory Visit: Payer: Self-pay | Admitting: *Deleted

## 2012-08-23 ENCOUNTER — Ambulatory Visit (HOSPITAL_BASED_OUTPATIENT_CLINIC_OR_DEPARTMENT_OTHER): Payer: PRIVATE HEALTH INSURANCE

## 2012-08-23 ENCOUNTER — Other Ambulatory Visit (HOSPITAL_BASED_OUTPATIENT_CLINIC_OR_DEPARTMENT_OTHER): Payer: PRIVATE HEALTH INSURANCE | Admitting: Lab

## 2012-08-23 ENCOUNTER — Ambulatory Visit (HOSPITAL_BASED_OUTPATIENT_CLINIC_OR_DEPARTMENT_OTHER): Payer: PRIVATE HEALTH INSURANCE | Admitting: Oncology

## 2012-08-23 ENCOUNTER — Telehealth: Payer: Self-pay | Admitting: *Deleted

## 2012-08-23 VITALS — BP 106/70 | HR 85 | Temp 97.6°F | Resp 20 | Ht 61.0 in | Wt 85.4 lb

## 2012-08-23 DIAGNOSIS — Z5112 Encounter for antineoplastic immunotherapy: Secondary | ICD-10-CM

## 2012-08-23 DIAGNOSIS — C50919 Malignant neoplasm of unspecified site of unspecified female breast: Secondary | ICD-10-CM

## 2012-08-23 DIAGNOSIS — C787 Secondary malignant neoplasm of liver and intrahepatic bile duct: Secondary | ICD-10-CM

## 2012-08-23 DIAGNOSIS — F17200 Nicotine dependence, unspecified, uncomplicated: Secondary | ICD-10-CM

## 2012-08-23 DIAGNOSIS — C773 Secondary and unspecified malignant neoplasm of axilla and upper limb lymph nodes: Secondary | ICD-10-CM

## 2012-08-23 LAB — COMPREHENSIVE METABOLIC PANEL (CC13)
ALT: 13 U/L (ref 0–55)
AST: 16 U/L (ref 5–34)
Albumin: 3.6 g/dL (ref 3.5–5.0)
Alkaline Phosphatase: 74 U/L (ref 40–150)
BUN: 14 mg/dL (ref 7.0–26.0)
CO2: 27 meq/L (ref 22–29)
Calcium: 9.8 mg/dL (ref 8.4–10.4)
Chloride: 107 meq/L (ref 98–107)
Creatinine: 0.7 mg/dL (ref 0.6–1.1)
Glucose: 93 mg/dL (ref 70–99)
Potassium: 3.7 meq/L (ref 3.5–5.1)
Sodium: 139 meq/L (ref 136–145)
Total Bilirubin: 0.45 mg/dL (ref 0.20–1.20)
Total Protein: 6.8 g/dL (ref 6.4–8.3)

## 2012-08-23 LAB — CBC WITH DIFFERENTIAL/PLATELET
Basophils Absolute: 0 10*3/uL (ref 0.0–0.1)
EOS%: 1.5 % (ref 0.0–7.0)
HGB: 13.7 g/dL (ref 11.6–15.9)
MCH: 34.3 pg — ABNORMAL HIGH (ref 25.1–34.0)
MCHC: 35.3 g/dL (ref 31.5–36.0)
MCV: 97.2 fL (ref 79.5–101.0)
MONO%: 6.5 % (ref 0.0–14.0)
RBC: 3.99 10*6/uL (ref 3.70–5.45)
RDW: 13.4 % (ref 11.2–14.5)

## 2012-08-23 LAB — LACTATE DEHYDROGENASE (CC13): LDH: 186 U/L (ref 125–220)

## 2012-08-23 MED ORDER — MEGESTROL ACETATE 625 MG/5ML PO SUSP: 625.0000 mg | Freq: Every day | ORAL | Status: DC

## 2012-08-23 MED ORDER — MEGESTROL ACETATE 40 MG/ML PO SUSP: 200.0000 mg | Freq: Two times a day (BID) | ORAL | Status: DC

## 2012-08-23 MED ORDER — SODIUM CHLORIDE 0.9 % IV SOLN
6.0000 mg/kg | Freq: Once | INTRAVENOUS | Status: AC
  Administered 2012-08-23: 231 mg via INTRAVENOUS
  Filled 2012-08-23: qty 11

## 2012-08-23 MED ORDER — SODIUM CHLORIDE 0.9 % IV SOLN
Freq: Once | INTRAVENOUS | Status: AC
  Administered 2012-08-23: 10:00:00 via INTRAVENOUS

## 2012-08-23 NOTE — Addendum Note (Signed)
Addended by: Arvilla Meres on: 08/23/2012 03:35 PM   Modules accepted: Orders

## 2012-08-23 NOTE — Progress Notes (Signed)
Hematology and Oncology Follow Up Visit  [name] [id number] 18-Mar-1966 46 y.o. 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   CURRENT THERAPY: Q3 week maintenance Herceptin.   Interim History:   [name] is seen today alone, here for Herceptin.  Overall energy level is actually pretty good. She continues to struggle with her weight. She has ongoing issues with smoking which he continues to but a pack every other day. She had recent scans done which were to show no evidence of disease. Recent 2-D echo also shows good ejection fraction. She complains of poor taste and then which also seems to affect her eating.  Remainder of the 10 point  review of systems is negative.movements.    MEDICATIONS: I have reviewed her current medications. No changes since we saw her last.   Allergies:  Allergies  Allergen Reactions  . Effexor Xr (Venlafaxine Hcl Er) Diarrhea  . No Known Allergies     Physical Exam: Filed Vitals:   08/23/12 0821  BP: 106/70  Pulse: 85  Temp: 97.6 F (36.4 C)  Resp: 20  Weight: 85 lbs. HEENT:  Sclerae anicteric, conjunctivae pink.  Oropharynx clear.  No mucositis or candidiasis.   Nodes:  No cervical, supraclavicular, or axillary lymphadenopathy palpated.  Lungs:  Clear to auscultation bilaterally.  No crackles, rhonchi, or wheezes.   Heart:  Regular rate and rhythm.   Abdomen:  Soft, nontender.  Positive bowel sounds.  No organomegaly or masses palpated.   Musculoskeletal:  No focal spinal tenderness to palpation.  Extremities:  Benign.  No peripheral edema or cyanosis.   Skin:  Benign.   Neuro:  Nonfocal, alert and oriented x 3.  Lab Results: Lab Results  Component Value Date   WBC 6.4 08/02/2012   HGB 13.6 08/02/2012   HCT 38.6 08/02/2012   MCV 93.9  08/02/2012   PLT 192 08/02/2012   NEUTROABS 4.8 08/02/2012    Assessment:  History of stage IV breast cancer HER-2 positive currently receiving Herceptin as maintenance. Recent scans show ongoing NED. In addition she has a good ejection fraction so we'll continue Herceptin. We'll I will see her in 6 weeks with Herceptin given every 3 weeks.. .    Plan:  1. Herceptin today.  Continue Herceptin every 3 weeks with followup in 6 weeks.Pierce Crane, MD

## 2012-08-23 NOTE — Patient Instructions (Signed)
Ninfa A Christoffersen 03/23/1966 [id number]  [facility] Discharge Instructions for Patients Receiving Chemotherapy  Today you received the following chemotherapy agents: Herceptin   To help prevent nausea and vomiting after your treatment, we encourage you to take your nausea medication as prescribed; if you develop nausea and vomiting that is not controlled by your nausea medication, call the clinic.   BELOW ARE SYMPTOMS THAT SHOULD BE REPORTED IMMEDIATELY:  *FEVER GREATER THAN 100.5 F *CHILLS WITH OR WITHOUT FEVER *UNUSUAL SHORTNESS OF BREATH *UNUSUAL BRUISING OR BLEEDING *URINARY PROBLEMS *BOWEL PROBLEMS  I have been informed and understand all the instructions given to me.   Please call the [facility] at [phone] during business hours should you have any further questions or need assistance in obtaining follow-up care. If you have a medical emergency, please dial 911.  

## 2012-08-23 NOTE — Telephone Encounter (Signed)
Gave patient appointments for treatment every three weeks with labs seeing the md in six weeks with lab md and treatment

## 2012-08-23 NOTE — Addendum Note (Signed)
Addended by: Arvilla Meres on: 08/23/2012 03:31 PM   Modules accepted: Orders

## 2012-08-23 NOTE — Telephone Encounter (Signed)
Per staff message and POF I have scheduled apapts. JMW  

## 2012-09-13 ENCOUNTER — Other Ambulatory Visit: Payer: PRIVATE HEALTH INSURANCE | Admitting: Lab

## 2012-09-13 ENCOUNTER — Ambulatory Visit: Payer: PRIVATE HEALTH INSURANCE

## 2012-10-02 ENCOUNTER — Other Ambulatory Visit: Payer: Self-pay | Admitting: Oncology

## 2012-10-02 DIAGNOSIS — C50919 Malignant neoplasm of unspecified site of unspecified female breast: Secondary | ICD-10-CM

## 2012-10-04 ENCOUNTER — Ambulatory Visit: Payer: PRIVATE HEALTH INSURANCE

## 2012-10-04 ENCOUNTER — Ambulatory Visit: Payer: PRIVATE HEALTH INSURANCE | Admitting: Oncology

## 2012-10-04 ENCOUNTER — Other Ambulatory Visit: Payer: PRIVATE HEALTH INSURANCE | Admitting: Lab

## 2012-10-09 ENCOUNTER — Other Ambulatory Visit: Payer: Self-pay | Admitting: Physician Assistant

## 2012-10-09 DIAGNOSIS — C50919 Malignant neoplasm of unspecified site of unspecified female breast: Secondary | ICD-10-CM

## 2012-10-09 MED ORDER — MIRTAZAPINE 15 MG PO TABS: 15.0000 mg | ORAL_TABLET | Freq: Every day | ORAL | Status: DC

## 2012-10-09 NOTE — Telephone Encounter (Signed)
Patient called to report she takes gabapentin at bedtime to help with hot flashes. Also adds that she needs refill on her remeron.

## 2012-10-09 NOTE — Telephone Encounter (Signed)
Left VM for patient to clarify why and how she is taking the gabapentin. Pharmacy requests 300 mg every hs and EMR has 300 mg tid.

## 2012-10-15 ENCOUNTER — Telehealth: Payer: Self-pay | Admitting: *Deleted

## 2012-10-15 NOTE — Telephone Encounter (Signed)
Called pt and informed her that Dr. Donnie Coffin will no longer be w/ the practice as of 10/23/12 and answered all questions at this time.  Confirmed 10/28/12 appt w/ pt.

## 2012-10-15 NOTE — Telephone Encounter (Signed)
Per Misty Stanley from [facility] center I have adjusted appt for 1/6. Appt adjusted due to MD reassignment.   JMW

## 2012-10-25 ENCOUNTER — Other Ambulatory Visit: Payer: PRIVATE HEALTH INSURANCE | Admitting: Lab

## 2012-10-25 ENCOUNTER — Ambulatory Visit: Payer: PRIVATE HEALTH INSURANCE

## 2012-10-26 ENCOUNTER — Encounter: Payer: Self-pay | Admitting: Oncology

## 2012-10-28 ENCOUNTER — Ambulatory Visit (HOSPITAL_BASED_OUTPATIENT_CLINIC_OR_DEPARTMENT_OTHER): Payer: PRIVATE HEALTH INSURANCE

## 2012-10-28 ENCOUNTER — Other Ambulatory Visit: Payer: Self-pay | Admitting: Oncology

## 2012-10-28 ENCOUNTER — Ambulatory Visit (HOSPITAL_BASED_OUTPATIENT_CLINIC_OR_DEPARTMENT_OTHER): Payer: PRIVATE HEALTH INSURANCE | Admitting: Oncology

## 2012-10-28 ENCOUNTER — Encounter: Payer: Self-pay | Admitting: Oncology

## 2012-10-28 ENCOUNTER — Other Ambulatory Visit: Payer: Self-pay | Admitting: *Deleted

## 2012-10-28 ENCOUNTER — Other Ambulatory Visit (HOSPITAL_BASED_OUTPATIENT_CLINIC_OR_DEPARTMENT_OTHER): Payer: PRIVATE HEALTH INSURANCE | Admitting: Lab

## 2012-10-28 ENCOUNTER — Telehealth: Payer: Self-pay | Admitting: Oncology

## 2012-10-28 VITALS — BP 104/69 | HR 83 | Temp 98.1°F | Resp 20 | Ht 61.0 in | Wt 88.0 lb

## 2012-10-28 DIAGNOSIS — C787 Secondary malignant neoplasm of liver and intrahepatic bile duct: Secondary | ICD-10-CM

## 2012-10-28 DIAGNOSIS — C50919 Malignant neoplasm of unspecified site of unspecified female breast: Secondary | ICD-10-CM

## 2012-10-28 DIAGNOSIS — Z5112 Encounter for antineoplastic immunotherapy: Secondary | ICD-10-CM

## 2012-10-28 DIAGNOSIS — Z171 Estrogen receptor negative status [ER-]: Secondary | ICD-10-CM

## 2012-10-28 LAB — COMPREHENSIVE METABOLIC PANEL (CC13)
AST: 16 U/L (ref 5–34)
Albumin: 3.5 g/dL (ref 3.5–5.0)
Alkaline Phosphatase: 83 U/L (ref 40–150)
BUN: 14 mg/dL (ref 7.0–26.0)
Potassium: 3.8 mEq/L (ref 3.5–5.1)
Sodium: 140 mEq/L (ref 136–145)
Total Protein: 7 g/dL (ref 6.4–8.3)

## 2012-10-28 LAB — CBC WITH DIFFERENTIAL/PLATELET
Eosinophils Absolute: 0.1 10*3/uL (ref 0.0–0.5)
HCT: 39.8 % (ref 34.8–46.6)
LYMPH%: 28.5 % (ref 14.0–49.7)
MONO#: 0.5 10*3/uL (ref 0.1–0.9)
NEUT#: 3.7 10*3/uL (ref 1.5–6.5)
Platelets: 159 10*3/uL (ref 145–400)
RBC: 4.24 10*6/uL (ref 3.70–5.45)
WBC: 6 10*3/uL (ref 3.9–10.3)
lymph#: 1.7 10*3/uL (ref 0.9–3.3)
nRBC: 0 % (ref 0–0)

## 2012-10-28 MED ORDER — SODIUM CHLORIDE 0.9 % IV SOLN
Freq: Once | INTRAVENOUS | Status: AC
  Administered 2012-10-28: 10:00:00 via INTRAVENOUS

## 2012-10-28 MED ORDER — SODIUM CHLORIDE 0.9 % IJ SOLN
10.0000 mL | INTRAMUSCULAR | Status: DC | PRN
  Administered 2012-10-28: 10 mL
  Filled 2012-10-28: qty 10

## 2012-10-28 MED ORDER — DIPHENHYDRAMINE HCL 25 MG PO CAPS: 50.0000 mg | ORAL_CAPSULE | Freq: Once | ORAL | Status: DC

## 2012-10-28 MED ORDER — TRASTUZUMAB CHEMO INJECTION 440 MG
6.0000 mg/kg | Freq: Once | INTRAVENOUS | Status: AC
  Administered 2012-10-28: 231 mg via INTRAVENOUS
  Filled 2012-10-28: qty 11

## 2012-10-28 MED ORDER — ACETAMINOPHEN 325 MG PO TABS: 650.0000 mg | ORAL_TABLET | Freq: Once | ORAL | Status: DC

## 2012-10-28 MED ORDER — HEPARIN SOD (PORK) LOCK FLUSH 100 UNIT/ML IV SOLN
500.0000 [IU] | Freq: Once | INTRAVENOUS | Status: AC | PRN
  Administered 2012-10-28: 500 [IU]
  Filled 2012-10-28: qty 5

## 2012-10-28 NOTE — Patient Instructions (Signed)
[facility] Discharge Instructions for Patients Receiving Chemotherapy  Today you received the following chemotherapy agent Herceptin.  To help prevent nausea and vomiting after your treatment, we encourage you to take your nausea medication as often as prescribed for by Dr. Welton Flakes.   If you develop nausea and vomiting that is not controlled by your nausea medication, call the clinic. If it is after clinic hours your family physician or the after hours number for the clinic or go to the Emergency Department.   BELOW ARE SYMPTOMS THAT SHOULD BE REPORTED IMMEDIATELY:  *FEVER GREATER THAN 100.5 F  *CHILLS WITH OR WITHOUT FEVER  NAUSEA AND VOMITING THAT IS NOT CONTROLLED WITH YOUR NAUSEA MEDICATION  *UNUSUAL SHORTNESS OF BREATH  *UNUSUAL BRUISING OR BLEEDING  TENDERNESS IN MOUTH AND THROAT WITH OR WITHOUT PRESENCE OF ULCERS  *URINARY PROBLEMS  *BOWEL PROBLEMS  UNUSUAL RASH Items with * indicate a potential emergency and should be followed up as soon as possible.  One of the nurses will contact you 24 hours after your treatment. Please let the nurse know about any problems that you may have experienced. Feel free to call the clinic you have any questions or concerns. The clinic phone number is [phone].   I have been informed and understand all the instructions given to me. I know to contact the clinic, my physician, or go to the Emergency Department if any problems should occur. I do not have any questions at this time, but understand that I may call the clinic during office hours   should I have any questions or need assistance in obtaining follow up care.    __________________________________________  _____________  __________ Signature of Patient or Authorized Representative            Date                   Time    __________________________________________ Nurse's Signature

## 2012-10-28 NOTE — Telephone Encounter (Signed)
lmonvm for pt re next appt for Friday 1/31 and mailed schedule. Per pt all appts must be on Friday as this is the only day she has child care. Pt orders were entered for lb/fu/tx appts q21 days to begin 1/27. Checked w/KK to see if appts could be moved to Fridays and per KK Fridays are too busy and pt could do lb/fu on thursdays and tx on fridays but not all 3 appts on Fridays. I informed pt and per pt this will not work for her . Her appts have always been on Fridays because that is the only day she has child care. Pt became upset and explained to me that she was given an appt for today (Monday) because of PR leaving and her needing to be transferred to a new doctor. Pt states that she accepted the appt for Monday because she was told that this would be the only one for Monday and then she could go back to Fridays. Appts were scheduled for Fridays per pt request because this is the only day she has child care.

## 2012-10-28 NOTE — Progress Notes (Signed)
OFFICE PROGRESS NOTE  CC Dr. Chevis Pretty Dr. 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  PRIOR THERAPY:  #1 patient originally presented in October 2008 with a palpable left breast mass. She underwent a mammogram and ultrasound on 07/25/2007. The mammogram showed at the 12:00 position 7 cm from the nipple a 1.2 x 0.9 x 0.5 cm hypoechoic mass indenting the underlying pectoralis muscle. The left axilla showed no palpable lymph nodes. She underwent a biopsy on 08/02/2007 the pathology revealed invasive ductal carcinoma intermediate to high-grade ER negative PR negative HER-2/neu positive with a proliferation marker Ki-67 of 40%. MRI of both breasts were performed on 08/12/2007 that showed a solitary enhancing mass in the upper portion of the left breast corresponding to the area of known malignancy measuring 1.3 x 1.0 x 1.0 cm.  #2 patient was subsequently treated with neoadjuvant chemotherapy consisting of Taxotere carboplatinum and Herceptin. She received a total of 6 cycles of this. Overall tolerated it well.  #3 she then went on to have a left lumpectomy with sentinel lymph node biopsy followed by radiation therapy and Herceptin. She received Herceptin weekly to complete out 1 year.  #4 in 2011 patient developed a left breast mass concerning for recurrent disease she had PET scan performed that revealed liver lesions as well as subpectoral area in the left breast that was positive. She had a biopsy performed that showed recurrence of her breast cancer. The tumor was invasive ductal carcinoma with lymphovascular invasion it was ER negative PR negative with a proliferation marker Ki-67 of 86% and HER-2/neu positive.  #5 patient then received Abraxane and Herceptin combination. Once she completed her Abraxane she went on to be on maintenance Herceptin every 3 weeks.  #6 her last staging scans were performed  in October 2013 that revealed no evidence of metastatic disease. Patient is now maintained on Herceptin every 3 weeks  CURRENT THERAPY: Patient is here for Herceptin every 3 weekly  INTERVAL HISTORY: [name] 47 y.o. female returns for followup visit with me for the first time. She has routinely been seen by Dr. Pierce Crane. Clinically patient seems to be doing well. She and I and had an extensive discussion today about her past history. Patient is weak tired fatigued appear she occasionally does get nausea. She denies any myalgias and arthralgias. She has no fevers chills no shortness of breath no chest pains or palpitations. Remainder of the 10 point review of systems is negative.  MEDICAL HISTORY: Past Medical History  Diagnosis Date  . Cancer   . Breast cancer 08-02-07    LEFT BREAST INVASIVE MAMMARY CARCINOMA  . 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  MEDICATIONS:  Current Outpatient Prescriptions  Medication Sig Dispense Refill  . acetaminophen (TYLENOL) 325 MG tablet Take 325 mg by mouth every 6 (six) hours as needed. MAY TAKE 1-2 TABS PRN HA       . Cholecalciferol 1000 UNITS tablet Take 1,000 Units by mouth daily.       Marland Kitchen gabapentin (NEURONTIN) 300 MG capsule Take 1 capsule (300 mg total) by mouth at bedtime. For hot flashes  30 capsule  3  . lidocaine-prilocaine (EMLA) cream Apply 1 application topically as needed. #60g tube w/3RF-CTS 10/13/11      . LORazepam (ATIVAN) 0.5 MG tablet TAKE  ONE TO TWO TABLETS BY MOUTH EVERY 6 TO 8 HOURS AS NEEDED FOR SLEEP/ANXIETY  90 tablet  2  . mirtazapine (REMERON) 15 MG tablet Take 1 tablet (15 mg total) by mouth at bedtime.  30 tablet  3  . megestrol (MEGACE ES) 625 MG/5ML suspension Take 5 mLs (625 mg total) by mouth daily.  150 mL  0  . megestrol (MEGACE ORAL) 40 MG/ML suspension Take 5 mLs (200 mg total) by mouth 2 (two) times  daily.  240 mL  0  . venlafaxine (EFFEXOR-XR) 37.5 MG 24 hr capsule Take 1 capsule (37.5 mg total) by mouth daily.  30 capsule  3    SURGICAL HISTORY:  Past Surgical History  Procedure Date  . Cesarean section     REVIEW OF SYSTEMS:  Pertinent items are noted in HPI.   HEALTH MAINTENANCE:  PHYSICAL EXAMINATION: Blood pressure 104/69, pulse 83, temperature 98.1 F (36.7 C), resp. rate 20, height 5\' 1"  (1.549 m), weight 88 lb (39.917 kg). Body mass index is 16.63 kg/(m^2). ECOG PERFORMANCE STATUS: 1 - Symptomatic but completely ambulatory   General appearance: alert, cooperative and appears stated age Lymph nodes: Cervical, supraclavicular, and axillary nodes normal. Resp: clear to auscultation bilaterally Back: symmetric, no curvature. ROM normal. No CVA tenderness. Cardio: regular rate and rhythm GI: soft, non-tender; bowel sounds normal; no masses,  no organomegaly Extremities: extremities normal, atraumatic, no cyanosis or edema Neurologic: Grossly normal   LABORATORY DATA: Lab Results  Component Value Date   WBC 6.0 10/28/2012   HGB 14.0 10/28/2012   HCT 39.8 10/28/2012   MCV 93.9 10/28/2012   PLT 159 10/28/2012      Chemistry      Component Value Date/Time   NA 139 08/23/2012 0930   NA 138 08/02/2012 1352   K 3.7 08/23/2012 0930   K 3.7 08/02/2012 1352   CL 107 08/23/2012 0930   CL 100 08/02/2012 1352   CO2 27 08/23/2012 0930   CO2 31 08/02/2012 1352   BUN 14.0 08/23/2012 0930   BUN 11 08/02/2012 1352   CREATININE 0.7 08/23/2012 0930   CREATININE 0.66 08/02/2012 1352      Component Value Date/Time   CALCIUM 9.8 08/23/2012 0930   CALCIUM 9.2 08/02/2012 1352   ALKPHOS 74 08/23/2012 0930   ALKPHOS 81 08/02/2012 1352   AST 16 08/23/2012 0930   AST 15 08/02/2012 1352   ALT 13 08/23/2012 0930   ALT 9 08/02/2012 1352   BILITOT 0.45 08/23/2012 0930   BILITOT 0.2* 08/02/2012 1352       RADIOGRAPHIC STUDIES:  No results found.  ASSESSMENT: 47 year old female with  #1  metastatic invasive ductal carcinoma with liver metastasis. Patient's original diagnosis was in 2008 and a clinical stage I. At that time she underwent neoadjuvant chemotherapy consisting of Taxotere carboplatinum Herceptin followed by a lumpectomy sentinel lymph node biopsy. She also received radiation and then subsequently finished out 1 year of adjuvant Herceptin. Her tumor was ER negative so she did not receive antiestrogen therapy.  #2 patient had recurrent disease in 2011 with liver metastasis. Again tumor was ER negative PR negative HER-2 positive. She initially received Abraxane and Herceptin combination. Once she completed Abraxane she went on to be maintained on Herceptin every 3 weeks.   PLAN:   #1 patient will proceed with her scheduled Herceptin.  #2 I will plan on doing restaging studies on her in March 2014. She will also in the very near future need an echocardiogram  to evaluate her left ejection fraction.   All questions were answered. The patient knows to call the clinic with any problems, questions or concerns. We can certainly see the patient much sooner if necessary.  I spent 55 minutes counseling the patient face to face. The total time spent in the appointment was 60 minutes.    Drue Second, MD Medical/Oncology [facility] [phone] (beeper) [phone] (Office)  10/28/2012, 9:05 AM

## 2012-10-28 NOTE — Patient Instructions (Addendum)
Proceed with scheduled Herceptin today  Eat foods rich in proteins and calories to maintain a good weight

## 2012-10-29 ENCOUNTER — Encounter: Payer: Self-pay | Admitting: Adult Health

## 2012-10-29 NOTE — Progress Notes (Signed)
This encounter was created in error - please disregard.

## 2012-11-15 ENCOUNTER — Other Ambulatory Visit: Payer: PRIVATE HEALTH INSURANCE | Admitting: Lab

## 2012-11-15 ENCOUNTER — Ambulatory Visit: Payer: PRIVATE HEALTH INSURANCE

## 2012-11-22 ENCOUNTER — Other Ambulatory Visit (HOSPITAL_BASED_OUTPATIENT_CLINIC_OR_DEPARTMENT_OTHER): Payer: PRIVATE HEALTH INSURANCE | Admitting: Lab

## 2012-11-22 ENCOUNTER — Encounter: Payer: Self-pay | Admitting: Adult Health

## 2012-11-22 ENCOUNTER — Ambulatory Visit (HOSPITAL_BASED_OUTPATIENT_CLINIC_OR_DEPARTMENT_OTHER): Payer: PRIVATE HEALTH INSURANCE

## 2012-11-22 ENCOUNTER — Ambulatory Visit (HOSPITAL_BASED_OUTPATIENT_CLINIC_OR_DEPARTMENT_OTHER): Payer: PRIVATE HEALTH INSURANCE | Admitting: Adult Health

## 2012-11-22 ENCOUNTER — Telehealth: Payer: Self-pay | Admitting: Oncology

## 2012-11-22 VITALS — BP 199/75 | HR 80 | Temp 98.3°F | Resp 20 | Ht 61.0 in | Wt 89.9 lb

## 2012-11-22 DIAGNOSIS — C50919 Malignant neoplasm of unspecified site of unspecified female breast: Secondary | ICD-10-CM

## 2012-11-22 DIAGNOSIS — C787 Secondary malignant neoplasm of liver and intrahepatic bile duct: Secondary | ICD-10-CM

## 2012-11-22 DIAGNOSIS — Z79899 Other long term (current) drug therapy: Secondary | ICD-10-CM

## 2012-11-22 DIAGNOSIS — Z853 Personal history of malignant neoplasm of breast: Secondary | ICD-10-CM

## 2012-11-22 DIAGNOSIS — Z5112 Encounter for antineoplastic immunotherapy: Secondary | ICD-10-CM

## 2012-11-22 DIAGNOSIS — Z171 Estrogen receptor negative status [ER-]: Secondary | ICD-10-CM

## 2012-11-22 LAB — CBC WITH DIFFERENTIAL/PLATELET
Basophils Absolute: 0 10*3/uL (ref 0.0–0.1)
Eosinophils Absolute: 0.1 10*3/uL (ref 0.0–0.5)
HCT: 40.1 % (ref 34.8–46.6)
HGB: 14.2 g/dL (ref 11.6–15.9)
LYMPH%: 36.7 % (ref 14.0–49.7)
MCV: 93.7 fL (ref 79.5–101.0)
MONO#: 0.4 10*3/uL (ref 0.1–0.9)
MONO%: 5.6 % (ref 0.0–14.0)
NEUT#: 3.8 10*3/uL (ref 1.5–6.5)
NEUT%: 55.8 % (ref 38.4–76.8)
Platelets: 162 10*3/uL (ref 145–400)
RBC: 4.28 10*6/uL (ref 3.70–5.45)

## 2012-11-22 LAB — COMPREHENSIVE METABOLIC PANEL (CC13)
BUN: 16.2 mg/dL (ref 7.0–26.0)
CO2: 26 mEq/L (ref 22–29)
Calcium: 9.2 mg/dL (ref 8.4–10.4)
Chloride: 106 mEq/L (ref 98–107)
Creatinine: 0.7 mg/dL (ref 0.6–1.1)
Glucose: 94 mg/dl (ref 70–99)
Total Bilirubin: 0.34 mg/dL (ref 0.20–1.20)

## 2012-11-22 MED ORDER — SODIUM CHLORIDE 0.9 % IV SOLN
Freq: Once | INTRAVENOUS | Status: AC
  Administered 2012-11-22: 16:00:00 via INTRAVENOUS

## 2012-11-22 MED ORDER — TRASTUZUMAB CHEMO INJECTION 440 MG
6.0000 mg/kg | Freq: Once | INTRAVENOUS | Status: AC
  Administered 2012-11-22: 231 mg via INTRAVENOUS
  Filled 2012-11-22: qty 11

## 2012-11-22 MED ORDER — HEPARIN SOD (PORK) LOCK FLUSH 100 UNIT/ML IV SOLN
500.0000 [IU] | Freq: Once | INTRAVENOUS | Status: AC | PRN
  Administered 2012-11-22: 500 [IU]
  Filled 2012-11-22: qty 5

## 2012-11-22 MED ORDER — SODIUM CHLORIDE 0.9 % IJ SOLN
10.0000 mL | INTRAMUSCULAR | Status: DC | PRN
  Administered 2012-11-22: 10 mL
  Filled 2012-11-22: qty 10

## 2012-11-22 NOTE — Progress Notes (Signed)
Pt here for Herceptin after office visit.  Pt refused Tylenol and Benadryl as premeds.  Pt stated " I have not taken Tylenol or Benadryl in 1 1/2  years.".

## 2012-11-22 NOTE — Patient Instructions (Signed)
[facility] Discharge Instructions for Patients Receiving Chemotherapy  Today you received the following chemotherapy agents :  Herceptin.  To help prevent nausea and vomiting after your treatment, we encourage you to take your nausea medication as instructed by your physician.    If you develop nausea and vomiting that is not controlled by your nausea medication, call the clinic. If it is after clinic hours your family physician or the after hours number for the clinic or go to the Emergency Department.   BELOW ARE SYMPTOMS THAT SHOULD BE REPORTED IMMEDIATELY:  *FEVER GREATER THAN 100.5 F  *CHILLS WITH OR WITHOUT FEVER  NAUSEA AND VOMITING THAT IS NOT CONTROLLED WITH YOUR NAUSEA MEDICATION  *UNUSUAL SHORTNESS OF BREATH  *UNUSUAL BRUISING OR BLEEDING  TENDERNESS IN MOUTH AND THROAT WITH OR WITHOUT PRESENCE OF ULCERS  *URINARY PROBLEMS  *BOWEL PROBLEMS  UNUSUAL RASH Items with * indicate a potential emergency and should be followed up as soon as possible.  One of the nurses will contact you 24 hours after your treatment. Please let the nurse know about any problems that you may have experienced. Feel free to call the clinic you have any questions or concerns. The clinic phone number is [phone].   I have been informed and understand all the instructions given to me. I know to contact the clinic, my physician, or go to the Emergency Department if any problems should occur. I do not have any questions at this time, but understand that I may call the clinic during office hours   should I have any questions or need assistance in obtaining follow up care.    __________________________________________  _____________  __________ Signature of Patient or Authorized Representative            Date                   Time    __________________________________________ Nurse's Signature    

## 2012-11-22 NOTE — Patient Instructions (Signed)
Doing well.  Proceed with Herceptin.  Please call us if you have any questions or concerns.    

## 2012-11-22 NOTE — Progress Notes (Signed)
OFFICE PROGRESS NOTE  CC Dr. Chevis Pretty Dr. 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  PRIOR THERAPY:  #1 patient originally presented in October 2008 with a palpable left breast mass. She underwent a mammogram and ultrasound on 07/25/2007. The mammogram showed at the 12:00 position 7 cm from the nipple a 1.2 x 0.9 x 0.5 cm hypoechoic mass indenting the underlying pectoralis muscle. The left axilla showed no palpable lymph nodes. She underwent a biopsy on 08/02/2007 the pathology revealed invasive ductal carcinoma intermediate to high-grade ER negative PR negative HER-2/neu positive with a proliferation marker Ki-67 of 40%. MRI of both breasts were performed on 08/12/2007 that showed a solitary enhancing mass in the upper portion of the left breast corresponding to the area of known malignancy measuring 1.3 x 1.0 x 1.0 cm.  #2 patient was subsequently treated with neoadjuvant chemotherapy consisting of Taxotere carboplatinum and Herceptin. She received a total of 6 cycles of this. Overall tolerated it well.  #3 she then went on to have a left lumpectomy with sentinel lymph node biopsy followed by radiation therapy and Herceptin. She received Herceptin weekly to complete out 1 year.  #4 in 2011 patient developed a left breast mass concerning for recurrent disease she had PET scan performed that revealed liver lesions as well as subpectoral area in the left breast that was positive. She had a biopsy performed that showed recurrence of her breast cancer. The tumor was invasive ductal carcinoma with lymphovascular invasion it was ER negative PR negative with a proliferation marker Ki-67 of 86% and HER-2/neu positive.  #5 patient then received Abraxane and Herceptin combination. Once she completed her Abraxane she went on to be on maintenance Herceptin every 3 weeks.  #6 her last staging scans were performed  in October 2013 that revealed no evidence of metastatic disease. Patient is now maintained on Herceptin every 3 weeks  CURRENT THERAPY: Patient is here for Herceptin every 3 weeks  INTERVAL HISTORY: Elisabetta A Gendreau 47 y.o. female returns for evaluation prior to her next dose of Herceptin.  She is doing well today.  Her last echo was on 10/29.  She denies fevers, chills, chest pain, shortness of breath, pain, or any other concerns.  A 10 point ROS is neg.   MEDICAL HISTORY: Past Medical History  Diagnosis Date  . Cancer   . Breast cancer 08-02-07    LEFT BREAST INVASIVE MAMMARY CARCINOMA  . 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  MEDICATIONS:  Current Outpatient Prescriptions  Medication Sig Dispense Refill  . Cholecalciferol 1000 UNITS tablet Take 1,000 Units by mouth daily.       Marland Kitchen gabapentin (NEURONTIN) 300 MG capsule Take 1 capsule (300 mg total) by mouth at bedtime. For hot flashes  30 capsule  3  . lidocaine-prilocaine (EMLA) cream Apply 1 application topically as needed. #60g tube w/3RF-CTS 10/13/11      . LORazepam (ATIVAN) 0.5 MG tablet TAKE ONE TO TWO TABLETS BY MOUTH EVERY 6 TO 8 HOURS AS NEEDED FOR SLEEP/ANXIETY  90 tablet  2  . mirtazapine (REMERON) 15 MG tablet Take 1 tablet (15 mg total) by mouth at bedtime.  30 tablet  3  . acetaminophen (TYLENOL) 325 MG tablet Take 325 mg by mouth every 6 (six) hours as needed. MAY TAKE 1-2  TABS PRN HA       . megestrol (MEGACE ES) 625 MG/5ML suspension Take 5 mLs (625 mg total) by mouth daily.  150 mL  0  . megestrol (MEGACE ORAL) 40 MG/ML suspension Take 5 mLs (200 mg total) by mouth 2 (two) times daily.  240 mL  0  . venlafaxine (EFFEXOR-XR) 37.5 MG 24 hr capsule Take 1 capsule (37.5 mg total) by mouth daily.  30 capsule  3    SURGICAL HISTORY:  Past Surgical History  Procedure Date  . Cesarean section     REVIEW OF  SYSTEMS:  General: fatigue (-), night sweats (-), fever (-), pain (-) Lymph: palpable nodes (-) HEENT: vision changes (-), mucositis (-), gum bleeding (-), epistaxis (-) Cardiovascular: chest pain (-), palpitations (-) Pulmonary: shortness of breath (-), dyspnea on exertion (-), cough (-), hemoptysis (-) GI:  Early satiety (-), melena (-), dysphagia (-), nausea/vomiting (-), diarrhea (-) GU: dysuria (-), hematuria (-), incontinence (-) Musculoskeletal: joint swelling (-), joint pain (-), back pain (-) Neuro: weakness (-), numbness (-), headache (-), confusion (-) Skin: Rash (-), lesions (-), dryness (-) Psych: depression (-), suicidal/homicidal ideation (-), feeling of hopelessness (-)  PHYSICAL EXAMINATION: Blood pressure 199/75, pulse 80, temperature 98.3 F (36.8 C), temperature source Oral, resp. rate 20, height 5\' 1"  (1.549 m), weight 89 lb 14.4 oz (40.778 kg). Body mass index is 16.99 kg/(m^2). General: Patient is a well appearing female in no acute distress HEENT: PERRLA, sclerae anicteric no conjunctival pallor, MMM Neck: supple, no palpable adenopathy Lungs: clear to auscultation bilaterally, no wheezes, rhonchi, or rales Cardiovascular: regular rate rhythm, S1, S2, no murmurs, rubs or gallops Abdomen: Soft, non-tender, non-distended, normoactive bowel sounds, no HSM Extremities: warm and well perfused, no clubbing, cyanosis, or edema Skin: No rashes or lesions Neuro: Non-focal ECOG PERFORMANCE STATUS: 1 - Symptomatic but completely ambulatory   LABORATORY DATA: Lab Results  Component Value Date   WBC 6.8 11/22/2012   HGB 14.2 11/22/2012   HCT 40.1 11/22/2012   MCV 93.7 11/22/2012   PLT 162 11/22/2012      Chemistry      Component Value Date/Time   NA 140 10/28/2012 0829   NA 138 08/02/2012 1352   K 3.8 10/28/2012 0829   K 3.7 08/02/2012 1352   CL 105 10/28/2012 0829   CL 100 08/02/2012 1352   CO2 26 10/28/2012 0829   CO2 31 08/02/2012 1352   BUN 14.0 10/28/2012 0829   BUN  11 08/02/2012 1352   CREATININE 0.7 10/28/2012 0829   CREATININE 0.66 08/02/2012 1352      Component Value Date/Time   CALCIUM 10.0 10/28/2012 0829   CALCIUM 9.2 08/02/2012 1352   ALKPHOS 83 10/28/2012 0829   ALKPHOS 81 08/02/2012 1352   AST 16 10/28/2012 0829   AST 15 08/02/2012 1352   ALT 8 10/28/2012 0829   ALT 9 08/02/2012 1352   BILITOT 0.36 10/28/2012 0829   BILITOT 0.2* 08/02/2012 1352       RADIOGRAPHIC STUDIES:  No results found.  ASSESSMENT: 47 year old female with  #1 metastatic invasive ductal carcinoma with liver metastasis. Patient's original diagnosis was in 2008 and a clinical stage I. At that time she underwent neoadjuvant chemotherapy consisting of Taxotere carboplatinum Herceptin followed by a lumpectomy sentinel lymph node biopsy. She also received radiation and then subsequently finished out 1 year of adjuvant Herceptin. Her tumor was ER negative so she did not receive antiestrogen therapy.  #2 patient had recurrent disease in  2011 with liver metastasis. Again tumor was ER negative PR negative HER-2 positive. She initially received Abraxane and Herceptin combination. Once she completed Abraxane she went on to be maintained on Herceptin every 3 weeks.   PLAN:   #1Ms. Larouche will proceed with her scheduled Herceptin today.  I have ordered an echo today on her.    #2  She will have restaging studies on her in March 2014.   #3 I will see her back in 3 weeks for her next treatment with Herceptin.   All questions were answered. The patient knows to call the clinic with any problems, questions or concerns. We can certainly see the patient much sooner if necessary.  I spent 25 minutes counseling the patient face to face. The total time spent in the appointment was 30 minutes.  Cherie Ouch Lyn Hollingshead, NP Medical Oncology [facility] Phone: [phone] 11/22/2012, 3:45 PM

## 2012-11-22 NOTE — Telephone Encounter (Signed)
No new orders and pt has schedule. Pt aware she will be contacted w/echo appt after preauth obtained. Order to [facility] for preauth.

## 2012-11-27 ENCOUNTER — Telehealth: Payer: Self-pay | Admitting: Oncology

## 2012-11-27 NOTE — Telephone Encounter (Signed)
S/w pt re echo appt for 2/12.

## 2012-12-04 ENCOUNTER — Ambulatory Visit (HOSPITAL_COMMUNITY): Payer: PRIVATE HEALTH INSURANCE | Attending: Adult Health

## 2012-12-06 ENCOUNTER — Other Ambulatory Visit: Payer: PRIVATE HEALTH INSURANCE | Admitting: Lab

## 2012-12-06 ENCOUNTER — Ambulatory Visit: Payer: PRIVATE HEALTH INSURANCE

## 2012-12-12 ENCOUNTER — Other Ambulatory Visit: Payer: Self-pay | Admitting: Medical Oncology

## 2012-12-12 NOTE — Progress Notes (Signed)
Called patient to inquire about echo that was scheduled 02/12, patient stated she could not make the appt and needed something earlier in day, preferably at 9 am and was waiting to hear back from scheduling. Informed patient will have sched call her to reschedule an echo. Patient stated she had also called earlier to cancel her appts that are sched for 12/13/12 with lab/NP/tx. Onc treatment sent. Will inform Md/NP of cancellation. Informed patient we are here to assist her and if she has any concerns or questions to pls call us. Patient verbalized understanding. No questions at this time.

## 2012-12-13 ENCOUNTER — Other Ambulatory Visit: Payer: PRIVATE HEALTH INSURANCE | Admitting: Lab

## 2012-12-13 ENCOUNTER — Ambulatory Visit: Payer: PRIVATE HEALTH INSURANCE | Admitting: Adult Health

## 2012-12-13 ENCOUNTER — Ambulatory Visit: Payer: PRIVATE HEALTH INSURANCE

## 2012-12-30 ENCOUNTER — Telehealth: Payer: Self-pay | Admitting: *Deleted

## 2012-12-30 NOTE — Telephone Encounter (Signed)
lm gave pt appt d/t for echo on 01/01/2013@ 10am...td

## 2013-01-01 ENCOUNTER — Ambulatory Visit (HOSPITAL_COMMUNITY): Admission: RE | Admit: 2013-01-01 | Payer: PRIVATE HEALTH INSURANCE | Source: Ambulatory Visit

## 2013-01-03 ENCOUNTER — Other Ambulatory Visit (HOSPITAL_BASED_OUTPATIENT_CLINIC_OR_DEPARTMENT_OTHER): Payer: PRIVATE HEALTH INSURANCE | Admitting: Lab

## 2013-01-03 ENCOUNTER — Encounter: Payer: Self-pay | Admitting: Adult Health

## 2013-01-03 ENCOUNTER — Ambulatory Visit (HOSPITAL_BASED_OUTPATIENT_CLINIC_OR_DEPARTMENT_OTHER): Payer: PRIVATE HEALTH INSURANCE | Admitting: Adult Health

## 2013-01-03 ENCOUNTER — Ambulatory Visit: Payer: PRIVATE HEALTH INSURANCE

## 2013-01-03 ENCOUNTER — Other Ambulatory Visit: Payer: Self-pay | Admitting: Emergency Medicine

## 2013-01-03 VITALS — BP 104/67 | HR 93 | Temp 98.5°F | Resp 20 | Ht 61.0 in | Wt 90.2 lb

## 2013-01-03 DIAGNOSIS — C787 Secondary malignant neoplasm of liver and intrahepatic bile duct: Secondary | ICD-10-CM

## 2013-01-03 DIAGNOSIS — Z853 Personal history of malignant neoplasm of breast: Secondary | ICD-10-CM

## 2013-01-03 DIAGNOSIS — C50919 Malignant neoplasm of unspecified site of unspecified female breast: Secondary | ICD-10-CM

## 2013-01-03 DIAGNOSIS — Z171 Estrogen receptor negative status [ER-]: Secondary | ICD-10-CM

## 2013-01-03 LAB — CBC WITH DIFFERENTIAL/PLATELET
Basophils Absolute: 0 10*3/uL (ref 0.0–0.1)
Eosinophils Absolute: 0.1 10*3/uL (ref 0.0–0.5)
HCT: 38.5 % (ref 34.8–46.6)
HGB: 13.3 g/dL (ref 11.6–15.9)
LYMPH%: 19.6 % (ref 14.0–49.7)
MCV: 98.1 fL (ref 79.5–101.0)
MONO#: 0.4 10*3/uL (ref 0.1–0.9)
NEUT#: 5.4 10*3/uL (ref 1.5–6.5)
NEUT%: 73.3 % (ref 38.4–76.8)
Platelets: 185 10*3/uL (ref 145–400)
RBC: 3.93 10*6/uL (ref 3.70–5.45)
WBC: 7.4 10*3/uL (ref 3.9–10.3)

## 2013-01-03 LAB — COMPREHENSIVE METABOLIC PANEL (CC13)
BUN: 11.6 mg/dL (ref 7.0–26.0)
CO2: 28 mEq/L (ref 22–29)
Glucose: 129 mg/dl — ABNORMAL HIGH (ref 70–99)
Sodium: 140 mEq/L (ref 136–145)
Total Bilirubin: 0.44 mg/dL (ref 0.20–1.20)
Total Protein: 6.8 g/dL (ref 6.4–8.3)

## 2013-01-03 NOTE — Progress Notes (Signed)
OFFICE PROGRESS NOTE  CC Dr. Chevis Pretty Dr. 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  PRIOR THERAPY:  #1 patient originally presented in October 2008 with a palpable left breast mass. She underwent a mammogram and ultrasound on 07/25/2007. The mammogram showed at the 12:00 position 7 cm from the nipple a 1.2 x 0.9 x 0.5 cm hypoechoic mass indenting the underlying pectoralis muscle. The left axilla showed no palpable lymph nodes. She underwent a biopsy on 08/02/2007 the pathology revealed invasive ductal carcinoma intermediate to high-grade ER negative PR negative HER-2/neu positive with a proliferation marker Ki-67 of 40%. MRI of both breasts were performed on 08/12/2007 that showed a solitary enhancing mass in the upper portion of the left breast corresponding to the area of known malignancy measuring 1.3 x 1.0 x 1.0 cm.  #2 patient was subsequently treated with neoadjuvant chemotherapy consisting of Taxotere carboplatinum and Herceptin. She received a total of 6 cycles of this. Overall tolerated it well.  #3 she then went on to have a left lumpectomy with sentinel lymph node biopsy followed by radiation therapy and Herceptin. She received Herceptin weekly to complete out 1 year.  #4 in 2011 patient developed a left breast mass concerning for recurrent disease she had PET scan performed that revealed liver lesions as well as subpectoral area in the left breast that was positive. She had a biopsy performed that showed recurrence of her breast cancer. The tumor was invasive ductal carcinoma with lymphovascular invasion it was ER negative PR negative with a proliferation marker Ki-67 of 86% and HER-2/neu positive.  #5 patient then received Abraxane and Herceptin combination. Once she completed her Abraxane she went on to be on maintenance Herceptin every 3 weeks.  #6 her last staging scans were performed  in October 2013 that revealed no evidence of metastatic disease. Patient is now maintained on Herceptin every 3 weeks  CURRENT THERAPY: Patient is here for Herceptin every 3 weeks  INTERVAL HISTORY: [name] 47 y.o. female returns for evaluation prior to her next dose of Herceptin.  She missed her last appt due to hand cracking that she attributed to the Herceptin.  She has had this previously.  She would like a suggestion of a cream to use.  Otherwise she is doing well and denies fevers, chills, nausea, vomiting, numbness, chest pain, swelling or any other concerns. Her last echo was in October.  She has missed an appt for echo in February and March, her next echo appt is on 3/20.  MEDICAL HISTORY: Past Medical History  Diagnosis Date  . Cancer   . Breast cancer 08-02-07    LEFT BREAST INVASIVE MAMMARY CARCINOMA  . 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  MEDICATIONS:  Current Outpatient Prescriptions  Medication Sig Dispense Refill  . acetaminophen (TYLENOL) 325 MG tablet Take 325 mg by mouth every 6 (six) hours as needed. MAY TAKE 1-2 TABS PRN HA       . Cholecalciferol 1000 UNITS tablet Take 1,000 Units by mouth daily.       Marland Kitchen gabapentin (NEURONTIN) 300 MG capsule Take 1 capsule (300 mg total) by mouth at bedtime. For hot flashes  30 capsule  3  . lidocaine-prilocaine (EMLA) cream Apply 1 application topically as needed. #60g tube w/3RF-CTS 10/13/11      .  LORazepam (ATIVAN) 0.5 MG tablet TAKE ONE TO TWO TABLETS BY MOUTH EVERY 6 TO 8 HOURS AS NEEDED FOR SLEEP/ANXIETY  90 tablet  2  . megestrol (MEGACE ES) 625 MG/5ML suspension Take 5 mLs (625 mg total) by mouth daily.  150 mL  0  . megestrol (MEGACE ORAL) 40 MG/ML suspension Take 5 mLs (200 mg total) by mouth 2 (two) times daily.  240 mL  0  . mirtazapine (REMERON) 15 MG tablet Take 1 tablet (15 mg total) by mouth at  bedtime.  30 tablet  3  . venlafaxine (EFFEXOR-XR) 37.5 MG 24 hr capsule Take 1 capsule (37.5 mg total) by mouth daily.  30 capsule  3   No current facility-administered medications for this visit.    SURGICAL HISTORY:  Past Surgical History  Procedure Laterality Date  . Cesarean section      REVIEW OF SYSTEMS:  General: fatigue (-), night sweats (-), fever (-), pain (-) Lymph: palpable nodes (-) HEENT: vision changes (-), mucositis (-), gum bleeding (-), epistaxis (-) Cardiovascular: chest pain (-), palpitations (-) Pulmonary: shortness of breath (-), dyspnea on exertion (-), cough (-), hemoptysis (-) GI:  Early satiety (-), melena (-), dysphagia (-), nausea/vomiting (-), diarrhea (-) GU: dysuria (-), hematuria (-), incontinence (-) Musculoskeletal: joint swelling (-), joint pain (-), back pain (-) Neuro: weakness (-), numbness (-), headache (-), confusion (-) Skin: Rash (-), lesions (-), dryness (-) Psych: depression (-), suicidal/homicidal ideation (-), feeling of hopelessness (-)  PHYSICAL EXAMINATION: Blood pressure 104/67, pulse 93, temperature 98.5 F (36.9 C), temperature source Oral, resp. rate 20, height 5\' 1"  (1.549 m), weight 90 lb 3.2 oz (40.914 kg). Body mass index is 17.05 kg/(m^2). General: Patient is a well appearing female in no acute distress HEENT: PERRLA, sclerae anicteric no conjunctival pallor, MMM Neck: supple, no palpable adenopathy Lungs: clear to auscultation bilaterally, no wheezes, rhonchi, or rales Cardiovascular: regular rate rhythm, S1, S2, no murmurs, rubs or gallops Abdomen: Soft, non-tender, non-distended, normoactive bowel sounds, no HSM Extremities: warm and well perfused, no clubbing, cyanosis, or edema Skin: No rashes or lesions Neuro: Non-focal ECOG PERFORMANCE STATUS: 1 - Symptomatic but completely ambulatory   LABORATORY DATA: Lab Results  Component Value Date   WBC 7.4 01/03/2013   HGB 13.3 01/03/2013   HCT 38.5 01/03/2013   MCV 98.1  01/03/2013   PLT 185 01/03/2013      Chemistry      Component Value Date/Time   NA 140 01/03/2013 1313   NA 138 08/02/2012 1352   K 3.6 01/03/2013 1313   K 3.7 08/02/2012 1352   CL 103 01/03/2013 1313   CL 100 08/02/2012 1352   CO2 28 01/03/2013 1313   CO2 31 08/02/2012 1352   BUN 11.6 01/03/2013 1313   BUN 11 08/02/2012 1352   CREATININE 0.8 01/03/2013 1313   CREATININE 0.66 08/02/2012 1352      Component Value Date/Time   CALCIUM 9.3 01/03/2013 1313   CALCIUM 9.2 08/02/2012 1352   ALKPHOS 74 01/03/2013 1313   ALKPHOS 81 08/02/2012 1352   AST 18 01/03/2013 1313   AST 15 08/02/2012 1352   ALT 14 01/03/2013 1313   ALT 9 08/02/2012 1352   BILITOT 0.44 01/03/2013 1313   BILITOT 0.2* 08/02/2012 1352       RADIOGRAPHIC STUDIES:  No results found.  ASSESSMENT: 47 year old female with  #1 metastatic invasive ductal carcinoma with liver metastasis. Patient's original diagnosis was in 2008 and a clinical stage I. At that  time she underwent neoadjuvant chemotherapy consisting of Taxotere carboplatinum Herceptin followed by a lumpectomy sentinel lymph node biopsy. She also received radiation and then subsequently finished out 1 year of adjuvant Herceptin. Her tumor was ER negative so she did not receive antiestrogen therapy.  #2 patient had recurrent disease in 2011 with liver metastasis. Again tumor was ER negative PR negative HER-2 positive. She initially received Abraxane and Herceptin combination. Once she completed Abraxane she went on to be maintained on Herceptin every 3 weeks.   PLAN:   #1Ms. Maqueda will not receive Herceptin today due to the fact that she hasn't had an echo since October.  She and I discussed that if she feels like her hands are cracking as a side effect from the treatment, then she should come in to be evaluated.  She will use Flexitol heel balm on her hands.    #2  She will have restaging studies on her in March/April 2014.   #3 I will see her back in 3 weeks  for her next treatment with Herceptin.   All questions were answered. The patient knows to call the clinic with any problems, questions or concerns. We can certainly see the patient much sooner if necessary.  I spent 25 minutes counseling the patient face to face. The total time spent in the appointment was 30 minutes.  Cherie Ouch Lyn Hollingshead, NP Medical Oncology [facility] Phone: [phone] 01/03/2013, 2:18 PM

## 2013-01-03 NOTE — Patient Instructions (Addendum)
Please have echocardiogram done prior to receiving any more Herceptin.  I recommend Flexitol heel balm for hand dryness.  We will see you after your echo for evaluation for next herceptin dose.

## 2013-01-09 ENCOUNTER — Ambulatory Visit (HOSPITAL_COMMUNITY)
Admission: RE | Admit: 2013-01-09 | Discharge: 2013-01-09 | Disposition: A | Discharge status: Home / Self Care | Payer: PRIVATE HEALTH INSURANCE | Source: Ambulatory Visit | Attending: Internal Medicine | Admitting: Internal Medicine

## 2013-01-09 DIAGNOSIS — C50919 Malignant neoplasm of unspecified site of unspecified female breast: Secondary | ICD-10-CM | POA: Insufficient documentation

## 2013-01-09 DIAGNOSIS — Z79899 Other long term (current) drug therapy: Secondary | ICD-10-CM

## 2013-01-09 DIAGNOSIS — Z5111 Encounter for antineoplastic chemotherapy: Secondary | ICD-10-CM

## 2013-01-09 DIAGNOSIS — F17200 Nicotine dependence, unspecified, uncomplicated: Secondary | ICD-10-CM | POA: Insufficient documentation

## 2013-01-09 NOTE — Progress Notes (Signed)
*  PRELIMINARY RESULTS* Echocardiogram 2D Echocardiogram has been performed.  [name] 01/09/2013, 9:48 AM

## 2013-01-10 ENCOUNTER — Telehealth: Payer: Self-pay | Admitting: Emergency Medicine

## 2013-01-10 NOTE — Telephone Encounter (Signed)
Patient calling to inquire about echocardiogram results.  Spoke with patient, reported to her that her echo is good with EF of 50-55%.   Instructed patient that she could receive further details in regards to echo report at her next office visit.

## 2013-01-14 ENCOUNTER — Other Ambulatory Visit: Payer: Self-pay | Admitting: Adult Health

## 2013-01-14 DIAGNOSIS — Z79899 Other long term (current) drug therapy: Secondary | ICD-10-CM

## 2013-01-14 DIAGNOSIS — C50919 Malignant neoplasm of unspecified site of unspecified female breast: Secondary | ICD-10-CM

## 2013-01-17 ENCOUNTER — Encounter (HOSPITAL_COMMUNITY)
Admission: RE | Admit: 2013-01-17 | Discharge: 2013-01-17 | Disposition: A | Discharge status: Home / Self Care | Payer: PRIVATE HEALTH INSURANCE | Source: Ambulatory Visit | Attending: Adult Health | Admitting: Adult Health

## 2013-01-17 DIAGNOSIS — Z79899 Other long term (current) drug therapy: Secondary | ICD-10-CM

## 2013-01-17 DIAGNOSIS — Z5111 Encounter for antineoplastic chemotherapy: Secondary | ICD-10-CM | POA: Insufficient documentation

## 2013-01-17 DIAGNOSIS — C50919 Malignant neoplasm of unspecified site of unspecified female breast: Secondary | ICD-10-CM | POA: Insufficient documentation

## 2013-01-17 MED ORDER — TECHNETIUM TC 99M-LABELED RED BLOOD CELLS IV KIT
23.0000 | PACK | Freq: Once | INTRAVENOUS | Status: AC | PRN
  Administered 2013-01-17: 23 via INTRAVENOUS

## 2013-01-20 ENCOUNTER — Telehealth: Payer: Self-pay | Admitting: Oncology

## 2013-01-20 NOTE — Telephone Encounter (Signed)
S/w chantell from dr benshimon's office and the pt is scheduled to see him on 02/20/2013@2 :30pm

## 2013-01-24 ENCOUNTER — Ambulatory Visit: Payer: PRIVATE HEALTH INSURANCE | Admitting: Adult Health

## 2013-01-24 ENCOUNTER — Ambulatory Visit: Payer: PRIVATE HEALTH INSURANCE

## 2013-01-24 ENCOUNTER — Other Ambulatory Visit: Payer: PRIVATE HEALTH INSURANCE | Admitting: Lab

## 2013-02-09 ENCOUNTER — Other Ambulatory Visit: Payer: Self-pay | Admitting: Oncology

## 2013-02-09 DIAGNOSIS — C50919 Malignant neoplasm of unspecified site of unspecified female breast: Secondary | ICD-10-CM

## 2013-02-11 ENCOUNTER — Ambulatory Visit (HOSPITAL_COMMUNITY)
Admission: RE | Admit: 2013-02-11 | Discharge: 2013-02-11 | Disposition: A | Discharge status: Home / Self Care | Payer: PRIVATE HEALTH INSURANCE | Source: Ambulatory Visit | Attending: Internal Medicine | Admitting: Internal Medicine

## 2013-02-11 ENCOUNTER — Encounter (HOSPITAL_COMMUNITY): Payer: Self-pay

## 2013-02-11 DIAGNOSIS — Z79899 Other long term (current) drug therapy: Secondary | ICD-10-CM | POA: Insufficient documentation

## 2013-02-11 DIAGNOSIS — C50919 Malignant neoplasm of unspecified site of unspecified female breast: Secondary | ICD-10-CM | POA: Insufficient documentation

## 2013-02-11 DIAGNOSIS — I429 Cardiomyopathy, unspecified: Secondary | ICD-10-CM

## 2013-02-11 DIAGNOSIS — Z87891 Personal history of nicotine dependence: Secondary | ICD-10-CM | POA: Insufficient documentation

## 2013-02-11 DIAGNOSIS — I427 Cardiomyopathy due to drug and external agent: Secondary | ICD-10-CM | POA: Insufficient documentation

## 2013-02-11 DIAGNOSIS — T451X5A Adverse effect of antineoplastic and immunosuppressive drugs, initial encounter: Secondary | ICD-10-CM

## 2013-02-11 DIAGNOSIS — C787 Secondary malignant neoplasm of liver and intrahepatic bile duct: Secondary | ICD-10-CM | POA: Insufficient documentation

## 2013-02-11 MED ORDER — CARVEDILOL 3.125 MG PO TABS: 3.1250 mg | ORAL_TABLET | Freq: Two times a day (BID) | ORAL | Status: DC

## 2013-02-11 MED ORDER — LOSARTAN POTASSIUM 25 MG PO TABS: 25.0000 mg | ORAL_TABLET | Freq: Every day | ORAL | Status: DC

## 2013-02-11 NOTE — Patient Instructions (Addendum)
Add carvedilol 3.125 mg twice daily.  If you are not dizzy or with other symptoms then start losartan 25 mg daily next week.    Follow up 3 weeks.    Dr. Welton Flakes will call you with instructions to start Tykerb

## 2013-02-11 NOTE — Progress Notes (Signed)
Referring Physician: Dr. Welton Flakes Primary Care: no PCP  HPI:  [name] is a 47 y.o. female with metastatic invasive ductal carcinoma with liver metastasis. Patient's original diagnosis was in 2008 and a clinical stage I. At that time she underwent neoadjuvant chemotherapy consisting of Taxotere carboplatinum Herceptin followed by a lumpectomy sentinel lymph node biopsy. She also received radiation and then subsequently finished out 1 year of adjuvant Herceptin. Her tumor was ER negative so she did not receive antiestrogen therapy.  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  Tumor was ER negative PR negative HER-2 positive. She initially received Abraxane and Herceptin combination. Once she completed Abraxane she went on to be maintained on Herceptin every 3 weeks indefinitely.  She also has a history of tobacco abuse, 1/2 to 1 ppd over the last 30 years.  She denies history of stroke, DM or CAD.     Echos:  08/23/10: EF 60-65%  Lat s' 11.89  09/05/11: EF 55-60% Lat s' 9.65  08/20/12:  EF 55-60% Lat s' 10  01/09/13: EF 50-55% Lat s' 9.9   MUGA 01/14/13: No WMA.  LVEF 49%  She has been referred to the cardio-onc clinic due to MUGA showing decreased EF 49%.  She has been taken off the last 2 herceptin therapies.  She says she feels well.  She denies dyspnea, orthopnea or PND.  No edema.  No dizziness/sycnope.  Continues tobacco abuse.     Review of Systems: [y] = yes, [ ]  = no   General: Weight gain [ ] ; Weight loss [ ] ; Anorexia [ ] ; Fatigue [ ] ; Fever [ ] ; Chills [ ] ; Weakness [ ]   Cardiac: Chest pain/pressure [ ] ; Resting SOB [ ] ; Exertional SOB [ ] ; Orthopnea [ ] ; Pedal Edema [ ] ; Palpitations [ ] ; Syncope [ ] ; Presyncope [ ] ; Paroxysmal nocturnal dyspnea[ ]   Pulmonary: Cough [ ] ; Wheezing[ ] ; Hemoptysis[ ] ; Sputum [ ] ; Snoring [ ]   GI: Vomiting[ ] ; Dysphagia[ ] ; Melena[ ] ; Hematochezia [ ] ; Heartburn[ ] ; Abdominal pain [ ] ; Constipation [ ] ; Diarrhea [ ] ; BRBPR [ ]   GU:  Hematuria[ ] ; Dysuria [ ] ; Nocturia[ ]   Vascular: Pain in legs with walking [ ] ; Pain in feet with lying flat [ ] ; Non-healing sores [ ] ; Stroke [ ] ; TIA [ ] ; Slurred speech [ ] ;  Neuro: Headaches[ ] ; Vertigo[ ] ; Seizures[ ] ; Paresthesias[ ] ;Blurred vision [ ] ; Diplopia [ ] ; Vision changes [ ]   Ortho/Skin: Arthritis [ ] ; Joint pain [ ] ; Muscle pain [ ] ; Joint swelling [ ] ; Back Pain [ ] ; Rash [ ]   Psych: Depression[ ] ; Anxiety[ ]   Heme: Bleeding problems [ ] ; Clotting disorders [ ] ; Anemia [ ]   Endocrine: Diabetes [ ] ; Thyroid dysfunction[ ]    Past Medical History  Diagnosis Date  . Cancer   . Breast cancer 08-02-07    LEFT BREAST INVASIVE MAMMARY CARCINOMA  . 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 acetaminophen (TYLENOL) 325 MG tablet Take 325 mg by mouth every 6 (six) hours as needed. MAY TAKE 1-2 TABS PRN HA       . Cholecalciferol 1000 UNITS tablet Take 1,000 Units by mouth daily.       Marland Kitchen gabapentin (NEURONTIN) 300 MG capsule TAKE ONE CAPSULE BY MOUTH AT BEDTIME, FOR HOT FLASHES  30 capsule  1  . lidocaine-prilocaine (EMLA) cream Apply 1  application topically as needed. #60g tube w/3RF-CTS 10/13/11      . LORazepam (ATIVAN) 0.5 MG tablet TAKE ONE TO TWO TABLETS BY MOUTH EVERY 6 TO 8 HOURS AS NEEDED FOR SLEEP/ANXIETY  90 tablet  2  . mirtazapine (REMERON) 15 MG tablet TAKE ONE TABLET BY MOUTH AT BEDTIME  30 tablet  1   No current facility-administered medications for this encounter.    Allergies  Allergen Reactions  . Effexor Xr (Venlafaxine Hcl Er) Diarrhea  . No Known Allergies   . Megace (Megestrol) Rash    History   Social History  . Marital Status: Married    Spouse Name: N/A    Number of Children: N/A  . Years of Education: N/A   Occupational History  . Not on file.   Social History Main Topics  . Smoking status: Current Every Day Smoker -- 1.00  packs/day for 30 years    Types: Cigarettes  . Smokeless tobacco: Not on file  . Alcohol Use: Yes  . Drug Use: Not on file  . Sexually Active: Yes   Other Topics Concern  . Not on file   Social History Narrative  . No narrative on file    Family History  Problem Relation Age of Onset  . Cancer Mother   . Heart disease Father   . Hypertension Brother    Father with heart trouble starting in late 29s or 49s, deceased in 77s from heart problems. Mother deceased from [place] Brother with HTN  PHYSICAL EXAM: Filed Vitals:   02/11/13 1115  BP: 104/62  Pulse: 110  Weight: 88 lb 4 oz (40.03 kg)  SpO2: 97%    General:  Well appearing. No respiratory difficulty HEENT: normal Neck: supple. no JVD. Carotids 2+ bilat; no bruits. No lymphadenopathy or thryomegaly appreciated. Cor: PMI nondisplaced. Tachycardic regular rhythm. No rubs, gallops or murmurs. Lungs: clear Abdomen: soft, nontender, nondistended. No hepatosplenomegaly. No bruits or masses. Good bowel sounds. Extremities: no cyanosis, clubbing, rash, edema Neuro: alert & oriented x 3, cranial nerves grossly intact. moves all 4 extremities w/o difficulty. Affect pleasant.   ASSESSMENT & PLAN:

## 2013-02-11 NOTE — Assessment & Plan Note (Addendum)
Have discussed the role of the Cardio-onc clinic with the patient and her husband.  Have reviewed echos back to 2011 with Dr. Gala Romney.  Although the patient's EF is on the low side of normal there appears to be beginning signs of herceptin cardiotoxicity.  Dr. Gala Romney has discussed the case with Dr. Welton Flakes and we will continue to hold herceptin at this time.  Will start carvedilol 3.125 mg BID, if BP tolerates she will add losartan next week.  We will follow up in 3 weeks for further med titration with plans to repeat echo in 6 weeks and with possibility to restart herceptin in the future.   Patient seen and examined with Ulyess Blossom, PA-C. We discussed all aspects of the encounter. I agree with the assessment and plan as stated above.  I am very suspicious that she is developing early cardiotoxicity. However, given her situation we would be eager to get her back on therapy. I talked to Dr. Welton Flakes about this. We will try to titrate on carvedilol and losartan fairly quickly. Dr. Welton Flakes will treat with Tykerb for now. We will see back in 3 weeks for further med titration and then get echo in 6 weeks with eye toward getting her back on Herceptin quickly. Discussed at length with patient and her husband.

## 2013-02-14 ENCOUNTER — Ambulatory Visit: Payer: PRIVATE HEALTH INSURANCE | Admitting: Adult Health

## 2013-02-14 ENCOUNTER — Other Ambulatory Visit: Payer: PRIVATE HEALTH INSURANCE | Admitting: Lab

## 2013-02-14 ENCOUNTER — Ambulatory Visit: Payer: PRIVATE HEALTH INSURANCE

## 2013-02-20 ENCOUNTER — Ambulatory Visit (HOSPITAL_COMMUNITY): Payer: PRIVATE HEALTH INSURANCE

## 2013-02-27 ENCOUNTER — Other Ambulatory Visit: Payer: Self-pay | Admitting: Oncology

## 2013-02-28 ENCOUNTER — Other Ambulatory Visit: Payer: Self-pay | Admitting: Oncology

## 2013-02-28 NOTE — Telephone Encounter (Signed)
Done on 02/27/13 by Dr Welton Flakes

## 2013-03-01 ENCOUNTER — Other Ambulatory Visit: Payer: Self-pay | Admitting: Oncology

## 2013-03-06 ENCOUNTER — Encounter (HOSPITAL_COMMUNITY): Payer: PRIVATE HEALTH INSURANCE

## 2013-03-07 ENCOUNTER — Ambulatory Visit: Payer: PRIVATE HEALTH INSURANCE

## 2013-03-07 ENCOUNTER — Other Ambulatory Visit: Payer: PRIVATE HEALTH INSURANCE | Admitting: Lab

## 2013-03-07 ENCOUNTER — Ambulatory Visit: Payer: PRIVATE HEALTH INSURANCE | Admitting: Adult Health

## 2013-03-19 ENCOUNTER — Encounter (HOSPITAL_COMMUNITY): Payer: Self-pay

## 2013-03-19 ENCOUNTER — Ambulatory Visit (HOSPITAL_COMMUNITY)
Admission: RE | Admit: 2013-03-19 | Discharge: 2013-03-19 | Disposition: A | Discharge status: Home / Self Care | Payer: PRIVATE HEALTH INSURANCE | Source: Ambulatory Visit | Attending: Internal Medicine | Admitting: Internal Medicine

## 2013-03-19 DIAGNOSIS — I428 Other cardiomyopathies: Secondary | ICD-10-CM | POA: Insufficient documentation

## 2013-03-19 DIAGNOSIS — Z5189 Encounter for other specified aftercare: Secondary | ICD-10-CM

## 2013-03-19 MED ORDER — CARVEDILOL 6.25 MG PO TABS: 6.2500 mg | ORAL_TABLET | Freq: Two times a day (BID) | ORAL | Status: AC

## 2013-03-19 NOTE — Assessment & Plan Note (Addendum)
Patient tolerated addition of carvedilol and losartan, will titrate carvedilol to 6.25 mg BID. Will repeat ECHO in 3-4 weeks and decide whether we can start Herceptin back.  Patient seen and examined with Tonye Becket, NP. We discussed all aspects of the encounter. I agree with the assessment and plan as stated above.  Herceptin remains on hold. Continue to titrate HF meds as above to help promote LV recovery. Repeat echo next month. I left a VM with Dr. Welton Flakes regarding the plan to start Tykerb or continue to hold all Her2-neu blocking agents.

## 2013-03-19 NOTE — Progress Notes (Addendum)
Patient ID: [name], female   DOB: February 13, 1966, 47 y.o.   MRN: [id number]  Referring Physician: Dr. Welton Flakes  Primary Care: no PCP  HPI:  [name] is a 47 y.o. female with metastatic invasive ductal carcinoma with liver metastasis. Patient's original diagnosis was in 2008 and a clinical stage I. At that time she underwent neoadjuvant chemotherapy consisting of Taxotere carboplatinum Herceptin followed by a lumpectomy sentinel lymph node biopsy. She also received radiation and then subsequently finished out 1 year of adjuvant Herceptin. Her tumor was ER negative so she did not receive antiestrogen therapy. 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 Tumor was ER negative PR negative HER-2 positive. She initially received Abraxane and Herceptin combination. Once she completed Abraxane she went on to be maintained on Herceptin every 3 weeks indefinitely. She also has a history of tobacco abuse, 1/2 to 1 ppd over the last 30 years. She denies history of stroke, DM or CAD  Echos: 08/23/10: EF 60-65% Lat s' 11.89  09/05/11: EF 55-60% Lat s' 9.65  08/20/12: EF 55-60% Lat s' 10  01/09/13: EF 50-55% Lat s' 9.9   MUGA 01/14/13: No WMA. LVEF 49%  Follow up: She was taken off 2 doses of Herceptin. Last visit started carvedilol 3.125 mg BID and then a week later started losartan 25 mg daily. Has tolerated well with no dizziness. Denies SOB, LE edema, or orthopnea.   ROS: All systems negative except as listed in HPI, PMH and Problem List.  Past Medical History  Diagnosis Date  . Cancer   . Breast cancer 08-02-07    LEFT BREAST INVASIVE MAMMARY CARCINOMA  . 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 acetaminophen (TYLENOL) 325 MG tablet Take 325 mg by mouth every 6 (six) hours as needed. MAY TAKE 1-2 TABS PRN HA       . carvedilol (COREG) 3.125 MG tablet Take 1 tablet (3.125  mg total) by mouth 2 (two) times daily with a meal.  60 tablet  3  . Cholecalciferol 1000 UNITS tablet Take 1,000 Units by mouth daily.       Marland Kitchen gabapentin (NEURONTIN) 300 MG capsule TAKE ONE CAPSULE BY MOUTH AT BEDTIME, FOR HOT FLASHES  30 capsule  1  . lidocaine-prilocaine (EMLA) cream Apply 1 application topically as needed. #60g tube w/3RF-CTS 10/13/11      . LORazepam (ATIVAN) 0.5 MG tablet Take 1 tablet (0.5 mg total) by mouth every 8 (eight) hours as needed for anxiety.  60 tablet  0  . losartan (COZAAR) 25 MG tablet Take 1 tablet (25 mg total) by mouth daily.  30 tablet  3  . mirtazapine (REMERON) 15 MG tablet TAKE ONE TABLET BY MOUTH AT BEDTIME  30 tablet  1   No current facility-administered medications for this encounter.     PHYSICAL EXAM: Filed Vitals:   03/19/13 1018  BP: 100/70  Pulse: 67  Weight: 91 lb (41.277 kg)  SpO2: 100%    General:  Well appearing. No resp difficulty; husband present HEENT: normal Neck: supple. JVP flat. Carotids 2+ bilaterally; no bruits. No lymphadenopathy or thryomegaly appreciated. Cor: PMI normal. Regular rate & rhythm. No rubs, gallops or murmurs. Lungs: clear Abdomen: soft, nontender, nondistended. No hepatosplenomegaly. No bruits or masses. Good bowel sounds. Extremities: no cyanosis, clubbing, rash, edema Neuro: alert & orientedx3, cranial nerves grossly intact. Moves all 4  extremities w/o difficulty. Affect pleasant.    ASSESSMENT & PLAN:

## 2013-03-19 NOTE — Patient Instructions (Addendum)
Increase carvedilol to 6.25 mg twice a day; finish bottle that you have taking 2 pills (am) and 2 pills (pm).  Repeat ECHO 3-4 weeks with appointment.

## 2013-03-21 ENCOUNTER — Telehealth: Payer: Self-pay | Admitting: Oncology

## 2013-03-21 ENCOUNTER — Other Ambulatory Visit (HOSPITAL_BASED_OUTPATIENT_CLINIC_OR_DEPARTMENT_OTHER): Payer: PRIVATE HEALTH INSURANCE

## 2013-03-21 ENCOUNTER — Encounter: Payer: Self-pay | Admitting: Adult Health

## 2013-03-21 ENCOUNTER — Ambulatory Visit (HOSPITAL_BASED_OUTPATIENT_CLINIC_OR_DEPARTMENT_OTHER): Payer: PRIVATE HEALTH INSURANCE | Admitting: Adult Health

## 2013-03-21 ENCOUNTER — Ambulatory Visit: Payer: PRIVATE HEALTH INSURANCE

## 2013-03-21 VITALS — BP 100/66 | HR 76 | Temp 99.0°F | Resp 20 | Ht 61.0 in | Wt 90.9 lb

## 2013-03-21 DIAGNOSIS — C50919 Malignant neoplasm of unspecified site of unspecified female breast: Secondary | ICD-10-CM

## 2013-03-21 DIAGNOSIS — C787 Secondary malignant neoplasm of liver and intrahepatic bile duct: Secondary | ICD-10-CM

## 2013-03-21 DIAGNOSIS — C773 Secondary and unspecified malignant neoplasm of axilla and upper limb lymph nodes: Secondary | ICD-10-CM

## 2013-03-21 DIAGNOSIS — Z171 Estrogen receptor negative status [ER-]: Secondary | ICD-10-CM

## 2013-03-21 LAB — COMPREHENSIVE METABOLIC PANEL (CC13)
ALT: 12 U/L (ref 0–55)
Albumin: 3.4 g/dL — ABNORMAL LOW (ref 3.5–5.0)
CO2: 29 mEq/L (ref 22–29)
Calcium: 9.4 mg/dL (ref 8.4–10.4)
Chloride: 103 mEq/L (ref 98–107)
Glucose: 111 mg/dl — ABNORMAL HIGH (ref 70–99)
Potassium: 3.8 mEq/L (ref 3.5–5.1)
Sodium: 140 mEq/L (ref 136–145)
Total Bilirubin: 0.4 mg/dL (ref 0.20–1.20)
Total Protein: 6.7 g/dL (ref 6.4–8.3)

## 2013-03-21 LAB — CBC WITH DIFFERENTIAL/PLATELET
BASO%: 0.3 % (ref 0.0–2.0)
Eosinophils Absolute: 0.1 10*3/uL (ref 0.0–0.5)
LYMPH%: 35.6 % (ref 14.0–49.7)
MCHC: 35.2 g/dL (ref 31.5–36.0)
MONO#: 0.3 10*3/uL (ref 0.1–0.9)
NEUT#: 3.7 10*3/uL (ref 1.5–6.5)
Platelets: 168 10*3/uL (ref 145–400)
RBC: 4.09 10*6/uL (ref 3.70–5.45)
RDW: 12.3 % (ref 11.2–14.5)
WBC: 6.3 10*3/uL (ref 3.9–10.3)
lymph#: 2.3 10*3/uL (ref 0.9–3.3)

## 2013-03-21 MED ORDER — LAPATINIB DITOSYLATE 250 MG PO TABS: 1250.0000 mg | ORAL_TABLET | Freq: Every day | ORAL | Status: DC

## 2013-03-21 NOTE — Progress Notes (Signed)
OFFICE PROGRESS NOTE  CC Dr. Chevis Pretty Dr. 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  PRIOR THERAPY:  #1 patient originally presented in October 2008 with a palpable left breast mass. She underwent a mammogram and ultrasound on 07/25/2007. The mammogram showed at the 12:00 position 7 cm from the nipple a 1.2 x 0.9 x 0.5 cm hypoechoic mass indenting the underlying pectoralis muscle. The left axilla showed no palpable lymph nodes. She underwent a biopsy on 08/02/2007 the pathology revealed invasive ductal carcinoma intermediate to high-grade ER negative PR negative HER-2/neu positive with a proliferation marker Ki-67 of 40%. MRI of both breasts were performed on 08/12/2007 that showed a solitary enhancing mass in the upper portion of the left breast corresponding to the area of known malignancy measuring 1.3 x 1.0 x 1.0 cm.  #2 patient was subsequently treated with neoadjuvant chemotherapy consisting of Taxotere carboplatinum and Herceptin. She received a total of 6 cycles of this. Overall tolerated it well.  #3 she then went on to have a left lumpectomy with sentinel lymph node biopsy followed by radiation therapy and Herceptin. She received Herceptin weekly to complete out 1 year.  #4 in 2011 patient developed a left breast mass concerning for recurrent disease she had PET scan performed that revealed liver lesions as well as subpectoral area in the left breast that was positive. She had a biopsy performed that showed recurrence of her breast cancer. The tumor was invasive ductal carcinoma with lymphovascular invasion it was ER negative PR negative with a proliferation marker Ki-67 of 86% and HER-2/neu positive.  #5 patient then received Abraxane and Herceptin combination. Once she completed her Abraxane she went on to be on maintenance Herceptin every 3 weeks.  #6 her last staging scans were performed  in October 2013 that revealed no evidence of metastatic disease. Patient is now maintained on Herceptin every 3 weeks  #7 Patient had echocardiogram and appointment with Dr. Gala Romney who determined she had cardiomyopathy and required holding Herceptin and she was placed on Carvedilol and Losartan.    CURRENT THERAPY: To start Lapatinib  INTERVAL HISTORY: [name] 47 y.o. female returns for evaluation today for her metastatic breast cancer.  She had been on herceptin therapy, however echo found her to have decreased EF and she was evaluated by Dr. Gala Romney.  He placed her on Carvedilol and Losartan and she will f/u with him in 3-4 weeks.  She however was cleared to start Lapatinib.  She is feeling well, she denies fevers, chills, nausea, vomiting, constipation, shortness of breath, weight loss, night sweats, or any new pain.    MEDICAL HISTORY: Past Medical History  Diagnosis Date  . Cancer   . Breast cancer 08-02-07    LEFT BREAST INVASIVE MAMMARY CARCINOMA  . 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  MEDICATIONS:  Current Outpatient Prescriptions  Medication Sig Dispense Refill  . acetaminophen (TYLENOL) 325 MG tablet Take 325 mg by mouth every 6 (six) hours as needed. MAY TAKE 1-2 TABS PRN HA       . carvedilol (COREG) 6.25 MG tablet Take 1 tablet (6.25 mg total) by mouth 2 (two) times daily with a meal.  60 tablet  3  . Cholecalciferol 1000 UNITS tablet Take 1,000 Units by mouth daily.       Marland Kitchen  gabapentin (NEURONTIN) 300 MG capsule TAKE ONE CAPSULE BY MOUTH AT BEDTIME, FOR HOT FLASHES  30 capsule  1  . lidocaine-prilocaine (EMLA) cream Apply 1 application topically as needed. #60g tube w/3RF-CTS 10/13/11      . LORazepam (ATIVAN) 0.5 MG tablet Take 1 tablet (0.5 mg total) by mouth every 8 (eight) hours as needed for anxiety.  60 tablet  0  . losartan (COZAAR) 25  MG tablet Take 1 tablet (25 mg total) by mouth daily.  30 tablet  3  . mirtazapine (REMERON) 15 MG tablet TAKE ONE TABLET BY MOUTH AT BEDTIME  30 tablet  1  . lapatinib (TYKERB) 250 MG tablet Take 5 tablets (1,250 mg total) by mouth daily.  150 tablet  2   No current facility-administered medications for this visit.    SURGICAL HISTORY:  Past Surgical History  Procedure Laterality Date  . Cesarean section      REVIEW OF SYSTEMS:  General: fatigue (-), night sweats (-), fever (-), pain (-) Lymph: palpable nodes (-) HEENT: vision changes (-), mucositis (-), gum bleeding (-), epistaxis (-) Cardiovascular: chest pain (-), palpitations (-) Pulmonary: shortness of breath (-), dyspnea on exertion (-), cough (-), hemoptysis (-) GI:  Early satiety (-), melena (-), dysphagia (-), nausea/vomiting (-), diarrhea (-) GU: dysuria (-), hematuria (-), incontinence (-) Musculoskeletal: joint swelling (-), joint pain (-), back pain (-) Neuro: weakness (-), numbness (-), headache (-), confusion (-) Skin: Rash (-), lesions (-), dryness (-) Psych: depression (-), suicidal/homicidal ideation (-), feeling of hopelessness (-)  PHYSICAL EXAMINATION: Blood pressure 100/66, pulse 76, temperature 99 F (37.2 C), temperature source Oral, resp. rate 20, height 5\' 1"  (1.549 m), weight 90 lb 14.4 oz (41.232 kg). Body mass index is 17.18 kg/(m^2). General: Patient is a well appearing female in no acute distress HEENT: PERRLA, sclerae anicteric no conjunctival pallor, MMM Neck: supple, no palpable adenopathy Lungs: clear to auscultation bilaterally, no wheezes, rhonchi, or rales Cardiovascular: regular rate rhythm, S1, S2, no murmurs, rubs or gallops Abdomen: Soft, non-tender, non-distended, normoactive bowel sounds, no HSM Extremities: warm and well perfused, no clubbing, cyanosis, or edema Skin: No rashes or lesions Neuro: Non-focal ECOG PERFORMANCE STATUS: 1 - Symptomatic but completely ambulatory    LABORATORY DATA: Lab Results  Component Value Date   WBC 6.3 03/21/2013   HGB 14.0 03/21/2013   HCT 39.6 03/21/2013   MCV 96.9 03/21/2013   PLT 168 03/21/2013      Chemistry      Component Value Date/Time   NA 140 03/21/2013 1300   NA 138 08/02/2012 1352   K 3.8 03/21/2013 1300   K 3.7 08/02/2012 1352   CL 103 03/21/2013 1300   CL 100 08/02/2012 1352   CO2 29 03/21/2013 1300   CO2 31 08/02/2012 1352   BUN 18.4 03/21/2013 1300   BUN 11 08/02/2012 1352   CREATININE 0.9 03/21/2013 1300   CREATININE 0.66 08/02/2012 1352      Component Value Date/Time   CALCIUM 9.4 03/21/2013 1300   CALCIUM 9.2 08/02/2012 1352   ALKPHOS 69 03/21/2013 1300   ALKPHOS 81 08/02/2012 1352   AST 20 03/21/2013 1300   AST 15 08/02/2012 1352   ALT 12 03/21/2013 1300   ALT 9 08/02/2012 1352   BILITOT 0.40 03/21/2013 1300   BILITOT 0.2* 08/02/2012 1352       RADIOGRAPHIC STUDIES:  No results found.  ASSESSMENT: 47 year old female with  #1 metastatic invasive ductal carcinoma with liver metastasis. Patient's original diagnosis  was in 2008 and a clinical stage I. At that time she underwent neoadjuvant chemotherapy consisting of Taxotere carboplatinum Herceptin followed by a lumpectomy sentinel lymph node biopsy. She also received radiation and then subsequently finished out 1 year of adjuvant Herceptin. Her tumor was ER negative so she did not receive antiestrogen therapy.  #2 patient had recurrent disease in 2011 with liver metastasis. Again tumor was ER negative PR negative HER-2 positive. She initially received Abraxane and Herceptin combination. Once she completed Abraxane she went on to be maintained on Herceptin every 3 weeks.  During a routine echo she was found to have a low normal EF.  She was evaluated with a MUGA scan and by Dr. Gala Romney.  She iscurrently taking Losartan and Carvedilol.  She will f/u with him in 3-4 weeks for repeat echo.     PLAN:   #1Ms. Dinh is doing well today.  I prescribed  Lapatinib at 1250mg  daily.  I faxed her insurance information to our [facility] outpatient pharmacy to evaluate the copay and availability.  She had to receive her Lapatinib at [facility] and her copay was likely going to be $200.  I discussed the medication details, and adverse effects with the patient in detail.  I gave her the phone number to the pharmacy and she is going to call them to confirm the copay.  If it is indeed $200, she is not going to get the medication.    #2  We will see her back in 1 month and order restaging studies at that time.    All questions were answered. The patient knows to call the clinic with any problems, questions or concerns. We can certainly see the patient much sooner if necessary.  I spent 25 minutes counseling the patient face to face. The total time spent in the appointment was 30 minutes.  Cherie Ouch Lyn Hollingshead, NP Medical Oncology [facility] Phone: [phone] 03/22/2013, 1:53 PM

## 2013-03-21 NOTE — Patient Instructions (Addendum)
Lapatinib oral tablet What is this medicine? LAPATINIB (la PA ti nib) is a chemotherapy drug. It targets specific enzymes within cancer cells and stops the cancer cells from growing. This medicine is used to treat some advanced breast cancers. It is often used along with other cancer treatments. This medicine may be used for other purposes; ask your health care provider or pharmacist if you have questions. What should I tell my health care provider before I take this medicine? They need to know if you have any of these conditions: -heart disease -heart rhythm problems (irregular heartbeat) -high blood pressure -history of low levels of potassium or magnesium -liver disease -an unusual reaction to lapatinib, other medicines, foods, dyes, or preservatives -pregnant or trying to get pregnant -breast-feeding How should I use this medicine? Take this medicine by mouth with a glass of water. Take this medicine on an empty stomach, at least 1 hour before or after a meal. Do not take with food. Follow the directions on the prescription label. Do not take your medicine more often than directed. Do not stop taking except on your doctor's advice. Talk to your pediatrician regarding the use of this medicine in children. Special care may be needed. This medicine is not approved for use in children. Overdosage: If you think you have taken too much of this medicine contact a poison control center or emergency room at once. NOTE: This medicine is only for you. Do not share this medicine with others. What if I miss a dose? If you miss a dose, take it as soon as you can. If it is almost time for your next dose, take only that dose. Do not take double or extra doses. What may interact with this medicine? Do not take this medicine with any of the following: -grapefruit or grapefruit juice This medicine may also interact with the following medications: -antiviral medicines for HIV or  AIDS -cisapride -clarithromycin -cyclosporine -daunorubicin -dexamethasone -diltiazem -doxorubicin -erythromycin -medicines for depression, anxiety or psychotic disturbances -medicines for fungal infections itraconazole, ketoconazole -medicines for irregular heart beat -medicines for seizures like carbamazepine, phenobarbital, phenytoin -rifabutin, rifampin, rifapentine -St. John's Wort or other herbal supplements -stomach acid blockers like cimetidine, famotidine, ranitidine, or omeprazole -telithromycin -verapamil This list may not describe all possible interactions. Give your health care provider a list of all the medicines, herbs, non-prescription drugs, or dietary supplements you use. Also tell them if you smoke, drink alcohol, or use illegal drugs. Some items may interact with your medicine. What should I watch for while using this medicine? Visit your doctor for checks on your progress. You will need to have regular blood tests while on this medicine. Report any new symptoms promptly. Let your doctor know if you have severe or persistent diarrhea, nausea, loss of appetite, or vomiting, as these side effects may cause dehydration. If you have shortness of breath, contact your doctor. Possible serious side effects include heart or lung problems. Do not become pregnant while taking this medicine. Women should inform their doctor if they wish to become pregnant or think they might be pregnant. There is a potential for serious side effects to an unborn child. Talk to your health care professional or pharmacist for more information. Do not breast-feed an infant while taking this medicine. What side effects may I notice from receiving this medicine? Side effects that you should report to your doctor or health care professional as soon as possible: -allergic reactions like skin rash, itching or hives, swelling of the face,  lips, or tongue -breathing problems -chest pain -cough that does  not go away -dizziness -feeling faint or lightheaded, falls -fever or chills, sore throat -palpitations -severe or persistent diarrhea -swelling of feet, legs -unusual bleeding, bruising -vomiting -yellowing of the eyes or skin Side effects that usually do not require medical attention (report to your doctor or health care professional if they continue or are bothersome): -decreased appetite -diarrhea -dry skin -heartburn -mouth sores -nausea -red, numb, swollen, or painful hands or feet -stomach upset -tired This list may not describe all possible side effects. Call your doctor for medical advice about side effects. You may report side effects to FDA at 1-800-FDA-1088. Where should I keep my medicine? Keep out of the reach of children. Store at room temperature between 15 and 30 degrees C (59 and 86 degrees F). Protect from moisture. Keep tightly closed. Throw away any unused medicine after the expiration date. NOTE: This sheet is a summary. It may not cover all possible information. If you have questions about this medicine, talk to your doctor, pharmacist, or health care provider.  2012, [employer]vier/Gold Standard. (10/06/2008 5:18:30 PM)

## 2013-04-16 ENCOUNTER — Other Ambulatory Visit: Payer: Self-pay | Admitting: *Deleted

## 2013-04-16 ENCOUNTER — Telehealth: Payer: Self-pay | Admitting: Medical Oncology

## 2013-04-16 DIAGNOSIS — C50919 Malignant neoplasm of unspecified site of unspecified female breast: Secondary | ICD-10-CM

## 2013-04-16 MED ORDER — MIRTAZAPINE 15 MG PO TABS: ORAL_TABLET | ORAL | Status: DC

## 2013-04-16 MED ORDER — GABAPENTIN 300 MG PO CAPS: ORAL_CAPSULE | ORAL | Status: DC

## 2013-04-16 NOTE — Telephone Encounter (Signed)
Pt LVMOM stating that her echo appt has been changed to 07/10, inquiring whether she still needs to see Augustin Schooling, NP for sched appt on 06/27 as well as labs, or should she r/s appt with NP after echo?  Mssg forwarded to NP/MD for review.

## 2013-04-16 NOTE — Telephone Encounter (Signed)
Per MD, informed patient will r/s appt with lab and Augustin Schooling NP after echo and scheduling will contact her to set up an appt at a convenient time for her. Patient expressed thanks, no further questions at this time. Knows to expect call from schedulers.   Onc tx sent.

## 2013-04-16 NOTE — Telephone Encounter (Signed)
Reschedule appt with NP after echo

## 2013-04-17 ENCOUNTER — Ambulatory Visit (HOSPITAL_COMMUNITY): Payer: PRIVATE HEALTH INSURANCE

## 2013-04-17 ENCOUNTER — Telehealth: Payer: Self-pay | Admitting: Oncology

## 2013-04-18 ENCOUNTER — Ambulatory Visit: Payer: PRIVATE HEALTH INSURANCE | Admitting: Adult Health

## 2013-04-18 ENCOUNTER — Telehealth: Payer: Self-pay | Admitting: Oncology

## 2013-04-18 ENCOUNTER — Other Ambulatory Visit: Payer: PRIVATE HEALTH INSURANCE | Admitting: Lab

## 2013-04-22 ENCOUNTER — Encounter: Payer: Self-pay | Admitting: Oncology

## 2013-05-01 ENCOUNTER — Ambulatory Visit (HOSPITAL_COMMUNITY)
Admission: RE | Admit: 2013-05-01 | Discharge: 2013-05-01 | Disposition: A | Discharge status: Home / Self Care | Payer: PRIVATE HEALTH INSURANCE | Source: Ambulatory Visit | Attending: Internal Medicine | Admitting: Internal Medicine

## 2013-05-01 ENCOUNTER — Encounter (HOSPITAL_COMMUNITY): Payer: Self-pay

## 2013-05-01 ENCOUNTER — Ambulatory Visit (HOSPITAL_BASED_OUTPATIENT_CLINIC_OR_DEPARTMENT_OTHER)
Admission: RE | Admit: 2013-05-01 | Discharge: 2013-05-01 | Disposition: A | Discharge status: Home / Self Care | Payer: PRIVATE HEALTH INSURANCE | Source: Ambulatory Visit | Attending: Internal Medicine | Admitting: Internal Medicine

## 2013-05-01 DIAGNOSIS — I509 Heart failure, unspecified: Secondary | ICD-10-CM | POA: Insufficient documentation

## 2013-05-01 DIAGNOSIS — Z09 Encounter for follow-up examination after completed treatment for conditions other than malignant neoplasm: Secondary | ICD-10-CM

## 2013-05-01 DIAGNOSIS — Z5189 Encounter for other specified aftercare: Secondary | ICD-10-CM

## 2013-05-01 DIAGNOSIS — I429 Cardiomyopathy, unspecified: Secondary | ICD-10-CM | POA: Insufficient documentation

## 2013-05-01 DIAGNOSIS — T451X5A Adverse effect of antineoplastic and immunosuppressive drugs, initial encounter: Secondary | ICD-10-CM | POA: Insufficient documentation

## 2013-05-01 DIAGNOSIS — Z853 Personal history of malignant neoplasm of breast: Secondary | ICD-10-CM | POA: Insufficient documentation

## 2013-05-01 NOTE — Progress Notes (Signed)
  Echocardiogram 2D Echocardiogram has been performed.  Jorje Guild 05/01/2013, 3:55 PM

## 2013-05-01 NOTE — Progress Notes (Signed)
Patient ID: [name], female   DOB: [birth year], 47 y.o.   MRN: [id number]  Referring Physician: Dr. Welton Flakes  Primary Care: no PCP  HPI:  [name] is a 47 y.o. female with metastatic invasive ductal carcinoma with liver metastasis. Original diagnosis was in 2008 and a clinical stage I. At that time she underwent neoadjuvant chemotherapy consisting of Taxotere carboplatinum Herceptin followed by a lumpectomy sentinel lymph node biopsy. She also received radiation and then subsequently finished out 1 year of adjuvant Herceptin. Her tumor was ER negative so she did not receive antiestrogen therapy. 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 Tumor was ER negative PR negative HER-2 positive. She initially received Abraxane and Herceptin combination. Once she completed Abraxane she went on to be maintained on Herceptin every 3 weeks indefinitely. She also has a history of tobacco abuse, 1/2 to 1 ppd over the last 30 years. She denies history of stroke, DM or CAD  Echos: 08/23/10: EF 60-65% Lat s' 11.89  09/05/11: EF 55-60% Lat s' 9.65  08/20/12: EF 55-60% Lat s' 10  01/09/13: EF 50-55% Lat s' 9.9  05/01/13 EF 55-60% Lateral s' 10.6  MUGA 01/14/13: No WMA. LVEF 49%  She returns for follow up.She has been off herceptin since March 2014 for reduced EF.  Last visit carvedilol was increased to 6.25 mg twice a day. Occasionally misses her medications. Denies SOB/PND/Orthopnea.   ROS: All systems negative except as listed in HPI, PMH and Problem List.  Past Medical History  Diagnosis Date  . Cancer   . Breast cancer 08-02-07    LEFT BREAST INVASIVE MAMMARY CARCINOMA  . 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 acetaminophen (TYLENOL) 325 MG tablet Take 325 mg by mouth every 6 (six) hours as needed. MAY TAKE 1-2 TABS PRN HA       . carvedilol (COREG) 6.25 MG tablet Take 1  tablet (6.25 mg total) by mouth 2 (two) times daily with a meal.  60 tablet  3  . Cholecalciferol 1000 UNITS tablet Take 1,000 Units by mouth daily.       Marland Kitchen gabapentin (NEURONTIN) 300 MG capsule TAKE ONE CAPSULE BY MOUTH AT BEDTIME, FOR HOT FLASHES  30 capsule  3  . lidocaine-prilocaine (EMLA) cream Apply 1 application topically as needed. #60g tube w/3RF-CTS 10/13/11      . LORazepam (ATIVAN) 0.5 MG tablet Take 1 tablet (0.5 mg total) by mouth every 8 (eight) hours as needed for anxiety.  60 tablet  0  . losartan (COZAAR) 25 MG tablet Take 1 tablet (25 mg total) by mouth daily.  30 tablet  3  . mirtazapine (REMERON) 15 MG tablet TAKE ONE TABLET BY MOUTH AT BEDTIME  30 tablet  3   No current facility-administered medications for this encounter.     PHYSICAL EXAM: Filed Vitals:   05/01/13 1552  BP: 100/64  Pulse: 63  Weight: 91 lb 1.9 oz (41.332 kg)  SpO2: 100%    General:  Well appearing. No resp difficulty; husband present HEENT: normal Neck: supple. JVP flat. Carotids 2+ bilaterally; no bruits. No lymphadenopathy or thryomegaly appreciated. Cor: PMI normal. Regular rate & rhythm. No rubs, gallops or murmurs. Lungs: clear Abdomen: soft, nontender, nondistended. No hepatosplenomegaly. No bruits or masses. Good bowel sounds. Extremities: no cyanosis, clubbing, rash, edema Neuro: alert & orientedx3, cranial nerves grossly intact. Moves all  4 extremities w/o difficulty. Affect pleasant.    ASSESSMENT & PLAN:

## 2013-05-01 NOTE — Patient Instructions (Addendum)
Follow up in 3 months with an ECHO 

## 2013-05-01 NOTE — Assessment & Plan Note (Addendum)
Dr Gala Romney reviewed and discussed ECHO. EF and lateral s' stable. Follow up in 3 months with an ECHO.   Patient seen and examined with Tonye Becket, NP. We discussed all aspects of the encounter. I agree with the assessment and plan as stated above. I reviewed echo personally.  She has had complete recovery of LV function. Ok to resume Herceptin carefully while continuing losartan and carvedilol. Will need to follow LV function closely.

## 2013-05-02 ENCOUNTER — Telehealth: Payer: Self-pay | Admitting: Medical Oncology

## 2013-05-02 NOTE — Telephone Encounter (Signed)
Pt LVMOM state she saw the "heart Dr and everything is good and that I can go with treatment." Patient inquiring as to whether she will be starting treatment on the day of her appt 07/18. Spoke with patient and informed her that NP will go over her treatment during that appt. Patient confirmed appt times, expressed thanks, no further questions at this time.

## 2013-05-05 ENCOUNTER — Other Ambulatory Visit: Payer: Self-pay | Admitting: *Deleted

## 2013-05-05 DIAGNOSIS — C50919 Malignant neoplasm of unspecified site of unspecified female breast: Secondary | ICD-10-CM

## 2013-05-05 MED ORDER — LORAZEPAM 0.5 MG PO TABS: 0.5000 mg | ORAL_TABLET | Freq: Three times a day (TID) | ORAL | Status: DC | PRN

## 2013-05-06 ENCOUNTER — Ambulatory Visit: Payer: PRIVATE HEALTH INSURANCE | Admitting: Adult Health

## 2013-05-06 ENCOUNTER — Other Ambulatory Visit: Payer: PRIVATE HEALTH INSURANCE | Admitting: Lab

## 2013-05-09 ENCOUNTER — Other Ambulatory Visit (HOSPITAL_BASED_OUTPATIENT_CLINIC_OR_DEPARTMENT_OTHER): Payer: PRIVATE HEALTH INSURANCE | Admitting: Lab

## 2013-05-09 ENCOUNTER — Telehealth: Payer: Self-pay | Admitting: *Deleted

## 2013-05-09 ENCOUNTER — Ambulatory Visit (HOSPITAL_BASED_OUTPATIENT_CLINIC_OR_DEPARTMENT_OTHER): Payer: PRIVATE HEALTH INSURANCE

## 2013-05-09 ENCOUNTER — Ambulatory Visit (HOSPITAL_BASED_OUTPATIENT_CLINIC_OR_DEPARTMENT_OTHER): Payer: PRIVATE HEALTH INSURANCE | Admitting: Adult Health

## 2013-05-09 ENCOUNTER — Encounter: Payer: Self-pay | Admitting: Adult Health

## 2013-05-09 VITALS — BP 105/63 | HR 89 | Temp 98.3°F | Resp 20 | Ht 61.0 in | Wt 91.9 lb

## 2013-05-09 DIAGNOSIS — C50919 Malignant neoplasm of unspecified site of unspecified female breast: Secondary | ICD-10-CM

## 2013-05-09 DIAGNOSIS — Z5112 Encounter for antineoplastic immunotherapy: Secondary | ICD-10-CM

## 2013-05-09 DIAGNOSIS — C787 Secondary malignant neoplasm of liver and intrahepatic bile duct: Secondary | ICD-10-CM

## 2013-05-09 DIAGNOSIS — Z171 Estrogen receptor negative status [ER-]: Secondary | ICD-10-CM

## 2013-05-09 LAB — CBC WITH DIFFERENTIAL/PLATELET
BASO%: 0.7 % (ref 0.0–2.0)
Eosinophils Absolute: 0.1 10*3/uL (ref 0.0–0.5)
LYMPH%: 30.5 % (ref 14.0–49.7)
MCHC: 34.7 g/dL (ref 31.5–36.0)
MONO#: 0.6 10*3/uL (ref 0.1–0.9)
MONO%: 8.9 % (ref 0.0–14.0)
NEUT#: 4.2 10*3/uL (ref 1.5–6.5)
Platelets: 165 10*3/uL (ref 145–400)
RBC: 4.1 10*6/uL (ref 3.70–5.45)
RDW: 12.4 % (ref 11.2–14.5)
WBC: 7.1 10*3/uL (ref 3.9–10.3)

## 2013-05-09 LAB — COMPREHENSIVE METABOLIC PANEL (CC13)
AST: 21 U/L (ref 5–34)
CO2: 28 mEq/L (ref 22–29)
Calcium: 9.7 mg/dL (ref 8.4–10.4)
Potassium: 4 mEq/L (ref 3.5–5.1)
Sodium: 140 mEq/L (ref 136–145)

## 2013-05-09 MED ORDER — DIPHENHYDRAMINE HCL 25 MG PO CAPS: 50.0000 mg | ORAL_CAPSULE | Freq: Once | ORAL | Status: DC

## 2013-05-09 MED ORDER — HEPARIN SOD (PORK) LOCK FLUSH 100 UNIT/ML IV SOLN
500.0000 [IU] | Freq: Once | INTRAVENOUS | Status: AC | PRN
  Administered 2013-05-09: 500 [IU]
  Filled 2013-05-09: qty 5

## 2013-05-09 MED ORDER — ACETAMINOPHEN 325 MG PO TABS: 650.0000 mg | ORAL_TABLET | Freq: Once | ORAL | Status: DC

## 2013-05-09 MED ORDER — SODIUM CHLORIDE 0.9 % IV SOLN
Freq: Once | INTRAVENOUS | Status: AC
  Administered 2013-05-09: 16:00:00 via INTRAVENOUS

## 2013-05-09 MED ORDER — SODIUM CHLORIDE 0.9 % IJ SOLN
10.0000 mL | INTRAMUSCULAR | Status: DC | PRN
  Administered 2013-05-09: 10 mL
  Filled 2013-05-09: qty 10

## 2013-05-09 MED ORDER — TRASTUZUMAB CHEMO INJECTION 440 MG
6.0000 mg/kg | Freq: Once | INTRAVENOUS | Status: AC
  Administered 2013-05-09: 231 mg via INTRAVENOUS
  Filled 2013-05-09: qty 11

## 2013-05-09 NOTE — Patient Instructions (Signed)
Doing well.  Proceed with herceptin.

## 2013-05-09 NOTE — Progress Notes (Signed)
OFFICE PROGRESS NOTE  CC Dr. Chevis Pretty Dr. 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  PRIOR THERAPY:  #1 patient originally presented in October 2008 with a palpable left breast mass. She underwent a mammogram and ultrasound on 07/25/2007. The mammogram showed at the 12:00 position 7 cm from the nipple a 1.2 x 0.9 x 0.5 cm hypoechoic mass indenting the underlying pectoralis muscle. The left axilla showed no palpable lymph nodes. She underwent a biopsy on 08/02/2007 the pathology revealed invasive ductal carcinoma intermediate to high-grade ER negative PR negative HER-2/neu positive with a proliferation marker Ki-67 of 40%. MRI of both breasts were performed on 08/12/2007 that showed a solitary enhancing mass in the upper portion of the left breast corresponding to the area of known malignancy measuring 1.3 x 1.0 x 1.0 cm.  #2 patient was subsequently treated with neoadjuvant chemotherapy consisting of Taxotere carboplatinum and Herceptin. She received a total of 6 cycles of this. Overall tolerated it well.  #3 she then went on to have a left lumpectomy with sentinel lymph node biopsy followed by radiation therapy and Herceptin. She received Herceptin weekly to complete out 1 year.  #4 in 2011 patient developed a left breast mass concerning for recurrent disease she had PET scan performed that revealed liver lesions as well as subpectoral area in the left breast that was positive. She had a biopsy performed that showed recurrence of her breast cancer. The tumor was invasive ductal carcinoma with lymphovascular invasion it was ER negative PR negative with a proliferation marker Ki-67 of 86% and HER-2/neu positive.  #5 patient then received Abraxane and Herceptin combination. Once she completed her Abraxane she went on to be on maintenance Herceptin every 3 weeks.  #6 her last staging scans were performed  in October 2013 that revealed no evidence of metastatic disease. Patient is now maintained on Herceptin every 3 weeks  #7 Patient had echocardiogram and appointment with Dr. Gala Romney who determined she had cardiomyopathy and required holding Herceptin and she was placed on Carvedilol and Losartan.  She saw Dr. Gala Romney on 05/01/13 after a couple of months of management and was determined to have fully recovered her LV function and was cleared to restart Herceptin.  We prescribed Lapatinib in the interim, however she declined therapy as she did not want to pay the co pay or look into assistance programs for this.      CURRENT THERAPY: Herceptin every 3 weeks.    INTERVAL HISTORY: [name] 47 y.o. female returns for evaluation today for her metastatic breast cancer.  She is doing well today and has been cleared by Dr. Gala Romney to restart Herceptin therapy.  She denies fevers, chills, nausea, vomiting, constipation, diarrhea, numbness, swelling, DOE, dyspnea, PND, swelling, pain, weight loss, night sweat, or any further concerns.  A 10 point ROS is negative.    MEDICAL HISTORY: Past Medical History  Diagnosis Date  . Cancer   . Breast cancer 08-02-07    LEFT BREAST INVASIVE MAMMARY CARCINOMA  . 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  MEDICATIONS:  Current Outpatient Prescriptions  Medication Sig Dispense Refill  . acetaminophen (TYLENOL) 325 MG tablet Take 325 mg by mouth every 6 (six) hours as needed. MAY TAKE 1-2 TABS PRN HA       .  carvedilol (COREG) 6.25 MG tablet Take 1 tablet (6.25 mg total) by mouth 2 (two) times daily with a meal.  60 tablet  3  . Cholecalciferol 1000 UNITS tablet Take 1,000 Units by mouth daily.       Marland Kitchen gabapentin (NEURONTIN) 300 MG capsule TAKE ONE CAPSULE BY MOUTH AT BEDTIME, FOR HOT FLASHES  30 capsule  3  . lidocaine-prilocaine (EMLA)  cream Apply 1 application topically as needed. #60g tube w/3RF-CTS 10/13/11      . LORazepam (ATIVAN) 0.5 MG tablet Take 1 tablet (0.5 mg total) by mouth every 8 (eight) hours as needed for anxiety.  60 tablet  0  . losartan (COZAAR) 25 MG tablet Take 1 tablet (25 mg total) by mouth daily.  30 tablet  3  . mirtazapine (REMERON) 15 MG tablet TAKE ONE TABLET BY MOUTH AT BEDTIME  30 tablet  3   No current facility-administered medications for this visit.    SURGICAL HISTORY:  Past Surgical History  Procedure Laterality Date  . Cesarean section      REVIEW OF SYSTEMS:  General: fatigue (-), night sweats (-), fever (-), pain (-) Lymph: palpable nodes (-) HEENT: vision changes (-), mucositis (-), gum bleeding (-), epistaxis (-) Cardiovascular: chest pain (-), palpitations (-) Pulmonary: shortness of breath (-), dyspnea on exertion (-), cough (-), hemoptysis (-) GI:  Early satiety (-), melena (-), dysphagia (-), nausea/vomiting (-), diarrhea (-) GU: dysuria (-), hematuria (-), incontinence (-) Musculoskeletal: joint swelling (-), joint pain (-), back pain (-) Neuro: weakness (-), numbness (-), headache (-), confusion (-) Skin: Rash (-), lesions (-), dryness (-) Psych: depression (-), suicidal/homicidal ideation (-), feeling of hopelessness (-)  PHYSICAL EXAMINATION: Blood pressure 105/63, pulse 89, temperature 98.3 F (36.8 C), temperature source Oral, resp. rate 20, height 5\' 1"  (1.549 m), weight 91 lb 14.4 oz (41.686 kg). Body mass index is 17.37 kg/(m^2). General: Patient is a well appearing female in no acute distress HEENT: PERRLA, sclerae anicteric no conjunctival pallor, MMM Neck: supple, no palpable adenopathy Lungs: clear to auscultation bilaterally, no wheezes, rhonchi, or rales Cardiovascular: regular rate rhythm, S1, S2, no murmurs, rubs or gallops Abdomen: Soft, non-tender, non-distended, normoactive bowel sounds, no HSM Extremities: warm and well perfused, no clubbing,  cyanosis, or edema Skin: No rashes or lesions Neuro: Non-focal Breasts: left breast lumpectomy site well healed, no nodularity, masses, right breast no nodularity, masses ECOG PERFORMANCE STATUS: 1 - Symptomatic but completely ambulatory   LABORATORY DATA: Lab Results  Component Value Date   WBC 7.1 05/09/2013   HGB 13.9 05/09/2013   HCT 40.0 05/09/2013   MCV 97.7 05/09/2013   PLT 165 05/09/2013      Chemistry      Component Value Date/Time   NA 140 05/09/2013 1442   NA 138 08/02/2012 1352   K 4.0 05/09/2013 1442   K 3.7 08/02/2012 1352   CL 103 03/21/2013 1300   CL 100 08/02/2012 1352   CO2 28 05/09/2013 1442   CO2 31 08/02/2012 1352   BUN 13.6 05/09/2013 1442   BUN 11 08/02/2012 1352   CREATININE 0.8 05/09/2013 1442   CREATININE 0.66 08/02/2012 1352      Component Value Date/Time   CALCIUM 9.7 05/09/2013 1442   CALCIUM 9.2 08/02/2012 1352   ALKPHOS 77 05/09/2013 1442   ALKPHOS 81 08/02/2012 1352   AST 21 05/09/2013 1442   AST 15 08/02/2012 1352   ALT 14 05/09/2013 1442   ALT 9 08/02/2012 1352   BILITOT  0.36 05/09/2013 1442   BILITOT 0.2* 08/02/2012 1352       RADIOGRAPHIC STUDIES:  No results found.  ASSESSMENT: 47 year old female with  #1 metastatic invasive ductal carcinoma with liver metastasis. Patient's original diagnosis was in 2008 and a clinical stage I. At that time she underwent neoadjuvant chemotherapy consisting of Taxotere carboplatinum Herceptin followed by a lumpectomy sentinel lymph node biopsy. She also received radiation and then subsequently finished out 1 year of adjuvant Herceptin. Her tumor was ER negative so she did not receive antiestrogen therapy.  #2 patient had recurrent disease in 2011 with liver metastasis. Again tumor was ER negative PR negative HER-2 positive. She initially received Abraxane and Herceptin combination. Once she completed Abraxane she went on to be maintained on Herceptin every 3 weeks.  During a routine echo she was found to have  a low normal EF.  She was evaluated with a MUGA scan and by Dr. Gala Romney.  She iscurrently taking Losartan and Carvedilol.  She will f/u with him in 3-4 weeks for repeat echo.     PLAN:   #1Ms. Kernen is doing well today.  She will proceed with Herceptin as cleared by Dr. Gala Romney.  She will continue the Losartan and Carvedilol per his recommendations and f/u with him in 3 months.  I ordered restaging studies to be done to evaluate her stage IV disease.    #2  We will see her back in 3 weeks.    All questions were answered. The patient knows to call the clinic with any problems, questions or concerns. We can certainly see the patient much sooner if necessary.  I spent 25 minutes counseling the patient face to face. The total time spent in the appointment was 30 minutes.  Cherie Ouch Lyn Hollingshead, NP Medical Oncology [facility] Phone: [phone] 05/12/2013, 9:34 AM

## 2013-05-09 NOTE — Patient Instructions (Addendum)
[facility] Discharge Instructions for Patients Receiving Chemotherapy  Today you received the following chemotherapy agents: herceptin  To help prevent nausea and vomiting after your treatment, we encourage you to take your nausea medication.  Take it as often as prescribed.     If you develop nausea and vomiting that is not controlled by your nausea medication, call the clinic. If it is after clinic hours your family physician or the after hours number for the clinic or go to the Emergency Department.   BELOW ARE SYMPTOMS THAT SHOULD BE REPORTED IMMEDIATELY:  *FEVER GREATER THAN 100.5 F  *CHILLS WITH OR WITHOUT FEVER  NAUSEA AND VOMITING THAT IS NOT CONTROLLED WITH YOUR NAUSEA MEDICATION  *UNUSUAL SHORTNESS OF BREATH  *UNUSUAL BRUISING OR BLEEDING  TENDERNESS IN MOUTH AND THROAT WITH OR WITHOUT PRESENCE OF ULCERS  *URINARY PROBLEMS  *BOWEL PROBLEMS  UNUSUAL RASH Items with * indicate a potential emergency and should be followed up as soon as possible.  Feel free to call the clinic you have any questions or concerns. The clinic phone number is [phone].   I have been informed and understand all the instructions given to me. I know to contact the clinic, my physician, or go to the Emergency Department if any problems should occur. I do not have any questions at this time, but understand that I may call the clinic during office hours   should I have any questions or need assistance in obtaining follow up care.    __________________________________________  _____________  __________ Signature of Patient or Authorized Representative            Date                   Time    __________________________________________ Nurse's Signature    

## 2013-05-09 NOTE — Telephone Encounter (Signed)
appts made and printed. Pt is aware that cs will call her with her CT appts. Pt is aware that her tx will follow after her appts. i emailed MW to add tx.Marland Kitchentd

## 2013-05-12 ENCOUNTER — Telehealth: Payer: Self-pay | Admitting: *Deleted

## 2013-05-12 NOTE — Telephone Encounter (Signed)
Per staff message and POF I have scheduled appts.  JMW  

## 2013-05-16 ENCOUNTER — Ambulatory Visit (HOSPITAL_COMMUNITY)
Admission: RE | Admit: 2013-05-16 | Discharge: 2013-05-16 | Disposition: A | Discharge status: Home / Self Care | Payer: PRIVATE HEALTH INSURANCE | Source: Ambulatory Visit | Attending: Adult Health | Admitting: Adult Health

## 2013-05-16 DIAGNOSIS — C50919 Malignant neoplasm of unspecified site of unspecified female breast: Secondary | ICD-10-CM | POA: Insufficient documentation

## 2013-05-16 MED ORDER — IOHEXOL 300 MG/ML  SOLN
80.0000 mL | Freq: Once | INTRAMUSCULAR | Status: AC | PRN
  Administered 2013-05-16: 80 mL via INTRAVENOUS

## 2013-05-30 ENCOUNTER — Ambulatory Visit (HOSPITAL_BASED_OUTPATIENT_CLINIC_OR_DEPARTMENT_OTHER): Payer: PRIVATE HEALTH INSURANCE

## 2013-05-30 ENCOUNTER — Other Ambulatory Visit (HOSPITAL_BASED_OUTPATIENT_CLINIC_OR_DEPARTMENT_OTHER): Payer: PRIVATE HEALTH INSURANCE | Admitting: Lab

## 2013-05-30 ENCOUNTER — Ambulatory Visit (HOSPITAL_BASED_OUTPATIENT_CLINIC_OR_DEPARTMENT_OTHER): Payer: PRIVATE HEALTH INSURANCE | Admitting: Adult Health

## 2013-05-30 ENCOUNTER — Encounter: Payer: Self-pay | Admitting: Adult Health

## 2013-05-30 VITALS — BP 106/74 | HR 86 | Temp 98.7°F | Resp 20 | Ht 61.0 in | Wt 90.8 lb

## 2013-05-30 DIAGNOSIS — C50919 Malignant neoplasm of unspecified site of unspecified female breast: Secondary | ICD-10-CM

## 2013-05-30 DIAGNOSIS — R05 Cough: Secondary | ICD-10-CM

## 2013-05-30 DIAGNOSIS — Z171 Estrogen receptor negative status [ER-]: Secondary | ICD-10-CM

## 2013-05-30 DIAGNOSIS — C787 Secondary malignant neoplasm of liver and intrahepatic bile duct: Secondary | ICD-10-CM

## 2013-05-30 DIAGNOSIS — C773 Secondary and unspecified malignant neoplasm of axilla and upper limb lymph nodes: Secondary | ICD-10-CM

## 2013-05-30 DIAGNOSIS — Z5112 Encounter for antineoplastic immunotherapy: Secondary | ICD-10-CM

## 2013-05-30 LAB — CBC WITH DIFFERENTIAL/PLATELET
Basophils Absolute: 0 10*3/uL (ref 0.0–0.1)
Eosinophils Absolute: 0.1 10*3/uL (ref 0.0–0.5)
HCT: 36.8 % (ref 34.8–46.6)
HGB: 13.1 g/dL (ref 11.6–15.9)
MCH: 34.5 pg — ABNORMAL HIGH (ref 25.1–34.0)
MONO#: 0.5 10*3/uL (ref 0.1–0.9)
NEUT#: 6.7 10*3/uL — ABNORMAL HIGH (ref 1.5–6.5)
NEUT%: 74.3 % (ref 38.4–76.8)
WBC: 9 10*3/uL (ref 3.9–10.3)
lymph#: 1.7 10*3/uL (ref 0.9–3.3)

## 2013-05-30 LAB — COMPREHENSIVE METABOLIC PANEL (CC13)
Albumin: 3.2 g/dL — ABNORMAL LOW (ref 3.5–5.0)
BUN: 13.3 mg/dL (ref 7.0–26.0)
CO2: 28 mEq/L (ref 22–29)
Calcium: 9.7 mg/dL (ref 8.4–10.4)
Chloride: 103 mEq/L (ref 98–109)
Creatinine: 0.7 mg/dL (ref 0.6–1.1)
Glucose: 110 mg/dl (ref 70–140)

## 2013-05-30 MED ORDER — SODIUM CHLORIDE 0.9 % IJ SOLN
10.0000 mL | INTRAMUSCULAR | Status: DC | PRN
  Administered 2013-05-30: 10 mL
  Filled 2013-05-30: qty 10

## 2013-05-30 MED ORDER — HEPARIN SOD (PORK) LOCK FLUSH 100 UNIT/ML IV SOLN
500.0000 [IU] | Freq: Once | INTRAVENOUS | Status: AC | PRN
  Administered 2013-05-30: 500 [IU]
  Filled 2013-05-30: qty 5

## 2013-05-30 MED ORDER — TRASTUZUMAB CHEMO INJECTION 440 MG
6.0000 mg/kg | Freq: Once | INTRAVENOUS | Status: AC
  Administered 2013-05-30: 231 mg via INTRAVENOUS
  Filled 2013-05-30: qty 11

## 2013-05-30 MED ORDER — SODIUM CHLORIDE 0.9 % IV SOLN: Freq: Once | INTRAVENOUS | Status: DC

## 2013-05-30 NOTE — Patient Instructions (Addendum)
[facility] Discharge Instructions for Patients Receiving Chemotherapy  Today you received the following chemotherapy agents herceptin   If you develop nausea and vomiting that is not controlled by your nausea medication, call the clinic.   BELOW ARE SYMPTOMS THAT SHOULD BE REPORTED IMMEDIATELY:  *FEVER GREATER THAN 100.5 F  *CHILLS WITH OR WITHOUT FEVER  NAUSEA AND VOMITING THAT IS NOT CONTROLLED WITH YOUR NAUSEA MEDICATION  *UNUSUAL SHORTNESS OF BREATH  *UNUSUAL BRUISING OR BLEEDING  TENDERNESS IN MOUTH AND THROAT WITH OR WITHOUT PRESENCE OF ULCERS  *URINARY PROBLEMS  *BOWEL PROBLEMS  UNUSUAL RASH Items with * indicate a potential emergency and should be followed up as soon as possible.  Feel free to call the clinic you have any questions or concerns. The clinic phone number is [phone].  

## 2013-05-30 NOTE — Patient Instructions (Addendum)
Varenicline oral tablets What is this medicine? VARENICLINE (var EN i kleen) is used to help people quit smoking. It can reduce the symptoms caused by stopping smoking. It is used with a patient support program recommended by your physician. This medicine may be used for other purposes; ask your health care provider or pharmacist if you have questions. What should I tell my health care provider before I take this medicine? They need to know if you have any of these conditions: -bipolar disorder, depression, schizophrenia or other mental illness -heart disease -kidney disease -peripheral vascular disease -stroke -suicidal thoughts, plans, or attempt; a previous suicide attempt by you or a family member -an unusual or allergic reaction to varenicline, other medicines, foods, dyes, or preservatives -pregnant or trying to get pregnant -breast-feeding How should I use this medicine? You should set a date to stop smoking and tell your doctor. Start this medicine one week before the quit date. You can also start taking this medicine before you choose a quit date, and then pick a quit date that is between 8 and 35 days of treatment with this medicine. Stick to your plan; ask about support groups or other ways to help you remain a 'quitter'. Take this medicine by mouth after eating. Take with a full glass of water. Follow the directions on the prescription label. Take your doses at regular intervals. Do not take your medicine more often than directed. A special MedGuide will be given to you by the pharmacist with each prescription and refill. Be sure to read this information carefully each time. Talk to your pediatrician regarding the use of this medicine in children. This medicine is not approved for use in children. Overdosage: If you think you have taken too much of this medicine contact a poison control center or emergency room at once. NOTE: This medicine is only for you. Do not share this medicine  with others. What if I miss a dose? If you miss a dose, take it as soon as you can. If it is almost time for your next dose, take only that dose. Do not take double or extra doses. What may interact with this medicine? -insulin -other stop smoking aids -theophylline -warfarin This list may not describe all possible interactions. Give your health care provider a list of all the medicines, herbs, non-prescription drugs, or dietary supplements you use. Also tell them if you smoke, drink alcohol, or use illegal drugs. Some items may interact with your medicine. What should I watch for while using this medicine? Visit your doctor or health care professional for regular check ups. Ask for ongoing advice and encouragement from your doctor or healthcare professional, friends, and family to help you quit. If you smoke while on this medication, quit again Your mouth may get dry. Chewing sugarless gum or sucking hard candy, and drinking plenty of water may help. Contact your doctor if the problem does not go away or is severe. You may get drowsy or dizzy. Do not drive, use machinery, or do anything that needs mental alertness until you know how this medicine affects you. Do not stand or sit up quickly, especially if you are an older patient. This reduces the risk of dizzy or fainting spells. The use of this medicine may increase the chance of suicidal thoughts or actions. Pay special attention to how you are responding while on this medicine. Any worsening of mood, or thoughts of suicide or dying should be reported to your health care professional right away.  What side effects may I notice from receiving this medicine? Side effects that you should report to your doctor or health care professional as soon as possible: -allergic reactions like skin rash, itching or hives, swelling of the face, lips, tongue, or throat -breathing problems -changes in vision -chest pain or chest tightness -confusion, trouble  speaking or understanding -fast, irregular heartbeat -feeling faint or lightheaded, falls -fever -pain in legs when walking -problems with balance, talking, walking -ringing in ears -sudden numbness or weakness of the face, arm or leg -suicidal thoughts or other mood changes -trouble passing urine or change in the amount of urine -unusual bleeding or bruising -unusually weak or tired Side effects that usually do not require medical attention (report to your doctor or health care professional if they continue or are bothersome): -constipation -headache -nausea, vomiting -strange dreams -stomach gas -trouble sleeping This list may not describe all possible side effects. Call your doctor for medical advice about side effects. You may report side effects to FDA at 1-800-FDA-1088. Where should I keep my medicine? Keep out of the reach of children. Store at room temperature between 15 and 30 degrees C (59 and 86 degrees F). Throw away any unused medicine after the expiration date. NOTE: This sheet is a summary. It may not cover all possible information. If you have questions about this medicine, talk to your doctor, pharmacist, or health care provider.  2013, [employer]. (05/16/2010 3:12:38 PM) Smoking Cessation Quitting smoking is important to your health and has many advantages. However, it is not always easy to quit since nicotine is a very addictive drug. Often times, people try 3 times or more before being able to quit. This document explains the best ways for you to prepare to quit smoking. Quitting takes hard work and a lot of effort, but you can do it. ADVANTAGES OF QUITTING SMOKING  You will live longer, feel better, and live better.  Your body will feel the impact of quitting smoking almost immediately.  Within 20 minutes, blood pressure decreases. Your pulse returns to its normal level.  After 8 hours, carbon monoxide levels in the blood return to normal. Your  oxygen level increases.  After 24 hours, the chance of having a heart attack starts to decrease. Your breath, hair, and body stop smelling like smoke.  After 48 hours, damaged nerve endings begin to recover. Your sense of taste and smell improve.  After 72 hours, the body is virtually free of nicotine. Your bronchial tubes relax and breathing becomes easier.  After 2 to 12 weeks, lungs can hold more air. Exercise becomes easier and circulation improves.  The risk of having a heart attack, stroke, cancer, or lung disease is greatly reduced.  After 1 year, the risk of coronary heart disease is cut in half.  After 5 years, the risk of stroke falls to the same as a nonsmoker.  After 10 years, the risk of lung cancer is cut in half and the risk of other cancers decreases significantly.  After 15 years, the risk of coronary heart disease drops, usually to the level of a nonsmoker.  If you are pregnant, quitting smoking will improve your chances of having a healthy baby.  The people you live with, especially any children, will be healthier.  You will have extra money to spend on things other than cigarettes. QUESTIONS TO THINK ABOUT BEFORE ATTEMPTING TO QUIT You may want to talk about your answers with your caregiver.  Why do you want to  quit?  If you tried to quit in the past, what helped and what did not?  What will be the most difficult situations for you after you quit? How will you plan to handle them?  Who can help you through the tough times? Your family? Friends? A caregiver?  What pleasures do you get from smoking? What ways can you still get pleasure if you quit? Here are some questions to ask your caregiver:  How can you help me to be successful at quitting?  What medicine do you think would be best for me and how should I take it?  What should I do if I need more help?  What is smoking withdrawal like? How can I get information on withdrawal? GET READY  Set a quit  date.  Change your environment by getting rid of all cigarettes, ashtrays, matches, and lighters in your home, car, or work. Do not let people smoke in your home.  Review your past attempts to quit. Think about what worked and what did not. GET SUPPORT AND ENCOURAGEMENT You have a better chance of being successful if you have help. You can get support in many ways.  Tell your family, friends, and co-workers that you are going to quit and need their support. Ask them not to smoke around you.  Get individual, group, or telephone counseling and support. Programs are available at [employer] and health centers. Call your local health department for information about programs in your area.  Spiritual beliefs and practices may help some smokers quit.  Download a "quit meter" on your computer to keep track of quit statistics, such as how long you have gone without smoking, cigarettes not smoked, and money saved.  Get a self-help book about quitting smoking and staying off of tobacco. LEARN NEW SKILLS AND BEHAVIORS  Distract yourself from urges to smoke. Talk to someone, go for a walk, or occupy your time with a task.  Change your normal routine. Take a different route to work. Drink tea instead of coffee. Eat breakfast in a different place.  Reduce your stress. Take a hot bath, exercise, or read a book.  Plan something enjoyable to do every day. Reward yourself for not smoking.  Explore interactive web-based programs that specialize in helping you quit. GET MEDICINE AND USE IT CORRECTLY Medicines can help you stop smoking and decrease the urge to smoke. Combining medicine with the above behavioral methods and support can greatly increase your chances of successfully quitting smoking.  Nicotine replacement therapy helps deliver nicotine to your body without the negative effects and risks of smoking. Nicotine replacement therapy includes nicotine gum, lozenges, inhalers, nasal sprays, and  skin patches. Some may be available over-the-counter and others require a prescription.  Antidepressant medicine helps people abstain from smoking, but how this works is unknown. This medicine is available by prescription.  Nicotinic receptor partial agonist medicine simulates the effect of nicotine in your brain. This medicine is available by prescription. Ask your caregiver for advice about which medicines to use and how to use them based on your health history. Your caregiver will tell you what side effects to look out for if you choose to be on a medicine or therapy. Carefully read the information on the package. Do not use any other product containing nicotine while using a nicotine replacement product.  RELAPSE OR DIFFICULT SITUATIONS Most relapses occur within the first 3 months after quitting. Do not be discouraged if you start smoking again. Remember, most people  try several times before finally quitting. You may have symptoms of withdrawal because your body is used to nicotine. You may crave cigarettes, be irritable, feel very hungry, cough often, get headaches, or have difficulty concentrating. The withdrawal symptoms are only temporary. They are strongest when you first quit, but they will go away within 10 14 days. To reduce the chances of relapse, try to:  Avoid drinking alcohol. Drinking lowers your chances of successfully quitting.  Reduce the amount of caffeine you consume. Once you quit smoking, the amount of caffeine in your body increases and can give you symptoms, such as a rapid heartbeat, sweating, and anxiety.  Avoid smokers because they can make you want to smoke.  Do not let weight gain distract you. Many smokers will gain weight when they quit, usually less than 10 pounds. Eat a healthy diet and stay active. You can always lose the weight gained after you quit.  Find ways to improve your mood other than smoking. FOR MORE INFORMATION  www.smokefree.gov  Document  Released: 10/03/2001 Document Revised: 04/09/2012 Document Reviewed: 01/18/2012 [facility] Patient Information 2014 [place], [place]. Nicotine skin patches What is this medicine? NICOTINE (NIK oh teen) helps people stop smoking. The patches replace the nicotine found in cigarettes and help to decrease withdrawal effects. They are most effective when used in combination with a stop-smoking program. This medicine may be used for other purposes; ask your health care provider or pharmacist if you have questions. What should I tell my health care provider before I take this medicine? They need to know if you have any of these conditions: -diabetes -heart disease, angina, irregular heartbeat or previous heart attack -lung disease, including asthma -overactive thyroid -pheochromocytoma -skin problems -stomach problems or ulcers -an unusual or allergic reaction to nicotine, adhesives, other medicines, foods, dyes, or preservatives -pregnant or trying to get pregnant -breast-feeding How should I use this medicine? This medicine is for use on the skin. Follow the directions that come with the patches. Find an area of skin on your upper arm, chest, or back that is clean, dry, greaseless, undamaged and hairless. Wash hands with plain soap and water. Do not use anything that contains aloe, lanolin or glycerin as these may prevent the patch from sticking. Dry thoroughly. Remove the patch from the sealed pouch. Do not try to cut or trim the patch. Using your palm, press the patch firmly in place for 10 seconds to make sure that there is good contact with your skin. After applying the patch, wash your hands. Change the patch every day, keeping to a regular schedule. When you apply a new patch, use a new area of skin. Wait at least 1 week before using the same area again. Talk to your pediatrician regarding the use of this medicine in children. Special care may be needed. Overdosage: If you think you have taken too  much of this medicine contact a poison control center or emergency room at once. NOTE: This medicine is only for you. Do not share this medicine with others. What if I miss a dose? If you forget to replace a patch, use it as soon as you can. Only use one patch at a time and do not leave on the skin for longer than directed. If a patch falls off, you can replace it, but keep to your schedule and remove the patch at the right time. What may interact with this medicine? -medicines for asthma -medicines for blood pressure -medicines for mental depression This list  may not describe all possible interactions. Give your health care provider a list of all the medicines, herbs, non-prescription drugs, or dietary supplements you use. Also tell them if you smoke, drink alcohol, or use illegal drugs. Some items may interact with your medicine. What should I watch for while using this medicine? Do not smoke, chew nicotine gum, or use snuff while you are using this medicine. This reduces the chance of a nicotine overdose. You can keep the patch in place during swimming, bathing, and showering. If your patch falls off during these activities, replace it. When you first apply the patch, your skin may itch or burn. This should soon go away. When you remove a patch, the skin may look red, but this should only last for a day. Call your doctor or health care professional if you get a permanent skin rash. If you are a diabetic and you quit smoking, the effects of insulin may be increased and you may need to reduce your insulin dose. Check with your doctor or health care professional about how you should adjust your insulin dose. If you are going to have a magnetic resonance imaging (MRI) procedure, tell your MRI technician if you have this patch on your body. It must be removed before a MRI. What side effects may I notice from receiving this medicine? Side effects that you should report to your doctor or health care  professional as soon as possible: -allergic reactions like skin rash, itching or hives, swelling of the face, lips, or tongue -breathing problems -changes in hearing -changes in vision -chest pain -cold sweats -confusion -fast, irregular heartbeat -feeling faint or lightheaded, falls -headache -increased saliva -nausea, vomiting -skin redness that lasts more than 4 days -stomach pain -weakness Side effects that usually do not require medical attention (report to your doctor or health care professional if they continue or are bothersome): -diarrhea -dry mouth -hiccups -irritability -nervousness or restlessness -trouble sleeping or vivid dreams This list may not describe all possible side effects. Call your doctor for medical advice about side effects. You may report side effects to FDA at 1-800-FDA-1088. Where should I keep my medicine? Keep out of the reach of children. Store at room temperature between 20 and 25 degrees C (68 and 77 degrees F). Protect from heat and light. Store in Tax [employer] until ready to use. Throw away unused medicine after the expiration date. When you remove a patch, fold with sticky sides together; put in an empty opened pouch and throw away. NOTE: This sheet is a summary. It may not cover all possible information. If you have questions about this medicine, talk to your doctor, pharmacist, or health care provider.  2012, [employer] Standard. (12/13/2010 1:06:00 PM)

## 2013-05-30 NOTE — Progress Notes (Signed)
OFFICE PROGRESS NOTE  CC Dr. Chevis Pretty Dr. 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  PRIOR THERAPY:  #1 patient originally presented in October 2008 with a palpable left breast mass. She underwent a mammogram and ultrasound on 07/25/2007. The mammogram showed at the 12:00 position 7 cm from the nipple a 1.2 x 0.9 x 0.5 cm hypoechoic mass indenting the underlying pectoralis muscle. The left axilla showed no palpable lymph nodes. She underwent a biopsy on 08/02/2007 the pathology revealed invasive ductal carcinoma intermediate to high-grade ER negative PR negative HER-2/neu positive with a proliferation marker Ki-67 of 40%. MRI of both breasts were performed on 08/12/2007 that showed a solitary enhancing mass in the upper portion of the left breast corresponding to the area of known malignancy measuring 1.3 x 1.0 x 1.0 cm.  #2 patient was subsequently treated with neoadjuvant chemotherapy consisting of Taxotere carboplatinum and Herceptin. She received a total of 6 cycles of this. Overall tolerated it well.  #3 she then went on to have a left lumpectomy with sentinel lymph node biopsy followed by radiation therapy and Herceptin. She received Herceptin weekly to complete out 1 year.  #4 in 2011 patient developed a left breast mass concerning for recurrent disease she had PET scan performed that revealed liver lesions as well as subpectoral area in the left breast that was positive. She had a biopsy performed that showed recurrence of her breast cancer. The tumor was invasive ductal carcinoma with lymphovascular invasion it was ER negative PR negative with a proliferation marker Ki-67 of 86% and HER-2/neu positive.  #5 patient then received Abraxane and Herceptin combination. Once she completed her Abraxane she went on to be on maintenance Herceptin every 3 weeks.  #6 her last staging scans were performed  in October 2013 that revealed no evidence of metastatic disease. Patient is now maintained on Herceptin every 3 weeks  #7 Patient had echocardiogram and appointment with Dr. Gala Romney who determined she had cardiomyopathy and required holding Herceptin and she was placed on Carvedilol and Losartan.  She saw Dr. Gala Romney on 05/01/13 after a couple of months of management and was determined to have fully recovered her LV function and was cleared to restart Herceptin.  We prescribed Lapatinib in the interim, however she declined therapy as she did not want to pay the co pay or look into assistance programs for this.      CURRENT THERAPY: Herceptin every 3 weeks.    INTERVAL HISTORY: [name] 47 y.o. female returns for evaluation today for her metastatic breast cancer.  She is doing well today and has been cleared by Dr. Gala Romney to restart Herceptin therapy.  She had recent scans that were negative.  She has had a cough for the past week, and some mild nasal drainage.  She denies shortness of breath, DOE, orthopnea, PND, swelling, fevers, chills, nausea,  chest pain, vomiting, constipation, diarrhea, or any further concerns.    MEDICAL HISTORY: Past Medical History  Diagnosis Date  . Cancer   . Breast cancer 08-02-07    LEFT BREAST INVASIVE MAMMARY CARCINOMA  . 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  MEDICATIONS:  Current Outpatient Prescriptions  Medication Sig Dispense Refill  . acetaminophen (TYLENOL) 325 MG tablet Take 325 mg by mouth every 6 (six) hours  as needed. MAY TAKE 1-2 TABS PRN HA       . carvedilol (COREG) 6.25 MG tablet Take 1 tablet (6.25 mg total) by mouth 2 (two) times daily with a meal.  60 tablet  3  . Cholecalciferol 1000 UNITS tablet Take 1,000 Units by mouth daily.       Marland Kitchen gabapentin (NEURONTIN) 300 MG capsule TAKE ONE CAPSULE BY MOUTH AT BEDTIME,  FOR HOT FLASHES  30 capsule  3  . lidocaine-prilocaine (EMLA) cream Apply 1 application topically as needed. #60g tube w/3RF-CTS 10/13/11      . LORazepam (ATIVAN) 0.5 MG tablet Take 1 tablet (0.5 mg total) by mouth every 8 (eight) hours as needed for anxiety.  60 tablet  0  . losartan (COZAAR) 25 MG tablet Take 1 tablet (25 mg total) by mouth daily.  30 tablet  3  . mirtazapine (REMERON) 15 MG tablet TAKE ONE TABLET BY MOUTH AT BEDTIME  30 tablet  3   No current facility-administered medications for this visit.    SURGICAL HISTORY:  Past Surgical History  Procedure Laterality Date  . Cesarean section      REVIEW OF SYSTEMS:  General: fatigue (-), night sweats (-), fever (-), pain (-) Lymph: palpable nodes (-) HEENT: vision changes (-), mucositis (-), gum bleeding (-), epistaxis (-) Cardiovascular: chest pain (-), palpitations (-) Pulmonary: shortness of breath (-), dyspnea on exertion (-), cough (-), hemoptysis (-) GI:  Early satiety (-), melena (-), dysphagia (-), nausea/vomiting (-), diarrhea (-) GU: dysuria (-), hematuria (-), incontinence (-) Musculoskeletal: joint swelling (-), joint pain (-), back pain (-) Neuro: weakness (-), numbness (-), headache (-), confusion (-) Skin: Rash (-), lesions (-), dryness (-) Psych: depression (-), suicidal/homicidal ideation (-), feeling of hopelessness (-)  PHYSICAL EXAMINATION: Blood pressure 106/74, pulse 86, temperature 98.7 F (37.1 C), temperature source Oral, resp. rate 20, height 5\' 1"  (1.549 m), weight 90 lb 12.8 oz (41.187 kg). Body mass index is 17.17 kg/(m^2). General: Patient is a well appearing female in no acute distress HEENT: PERRLA, sclerae anicteric no conjunctival pallor, MMM Neck: supple, no palpable adenopathy Lungs: clear to auscultation bilaterally, no wheezes, rhonchi, or rales Cardiovascular: regular rate rhythm, S1, S2, no murmurs, rubs or gallops Abdomen: Soft, non-tender, non-distended, normoactive bowel sounds,  no HSM Extremities: warm and well perfused, no clubbing, cyanosis, or edema Skin: No rashes or lesions Neuro: Non-focal Breasts: left breast lumpectomy site well healed, no nodularity, masses, right breast no nodularity, masses ECOG PERFORMANCE STATUS: 1 - Symptomatic but completely ambulatory   LABORATORY DATA: Lab Results  Component Value Date   WBC 9.0 05/30/2013   HGB 13.1 05/30/2013   HCT 36.8 05/30/2013   MCV 97.0 05/30/2013   PLT 186 05/30/2013      Chemistry      Component Value Date/Time   NA 143 05/30/2013 1403   NA 138 08/02/2012 1352   K 3.7 05/30/2013 1403   K 3.7 08/02/2012 1352   CL 103 03/21/2013 1300   CL 100 08/02/2012 1352   CO2 28 05/30/2013 1403   CO2 31 08/02/2012 1352   BUN 13.3 05/30/2013 1403   BUN 11 08/02/2012 1352   CREATININE 0.7 05/30/2013 1403   CREATININE 0.66 08/02/2012 1352      Component Value Date/Time   CALCIUM 9.7 05/30/2013 1403   CALCIUM 9.2 08/02/2012 1352   ALKPHOS 79 05/30/2013 1403   ALKPHOS 81 08/02/2012 1352   AST 17 05/30/2013 1403   AST 15 08/02/2012 1352  ALT 10 05/30/2013 1403   ALT 9 08/02/2012 1352   BILITOT 0.40 05/30/2013 1403   BILITOT 0.2* 08/02/2012 1352       RADIOGRAPHIC STUDIES:  No results found.  ASSESSMENT: 47 year old female with  #1 metastatic invasive ductal carcinoma with liver metastasis. Patient's original diagnosis was in 2008 and a clinical stage I. At that time she underwent neoadjuvant chemotherapy consisting of Taxotere carboplatinum Herceptin followed by a lumpectomy sentinel lymph node biopsy. She also received radiation and then subsequently finished out 1 year of adjuvant Herceptin. Her tumor was ER negative so she did not receive antiestrogen therapy.  #2 patient had recurrent disease in 2011 with liver metastasis. Again tumor was ER negative PR negative HER-2 positive. She initially received Abraxane and Herceptin combination. Once she completed Abraxane she went on to be maintained on Herceptin every 3 weeks.   During a routine echo she was found to have a low normal EF.  She was evaluated with a MUGA scan and by Dr. Gala Romney.  She iscurrently taking Losartan and Carvedilol.  She will f/u with him in 3-4 weeks for repeat echo.     PLAN:   #1Ms. Courser is doing well today.  She will proceed with Herceptin as cleared by Dr. Gala Romney.  She will continue the Losartan and Carvedilol per his recommendations and f/u with him in 3 months.  Restaging scans show no evidence of cancer.    #2 For the cough she will take Claritin daily.  We also discussed smoking cessation and she plans on trying the vapor cigarettes.  I talked to her about nicotine patches and Chantix.  She declined both of those, however, I gave her information to read and we will re-visit it in 3 weeks.    #3  We will see her back in 3 weeks.    All questions were answered. The patient knows to call the clinic with any problems, questions or concerns. We can certainly see the patient much sooner if necessary.  I spent 25 minutes counseling the patient face to face. The total time spent in the appointment was 30 minutes.  Cherie Ouch Lyn Hollingshead, NP Medical Oncology [facility] Phone: [phone] 05/31/2013, 3:23 PM

## 2013-05-30 NOTE — Progress Notes (Signed)
Patient refused tylenol and benadryl.

## 2013-06-18 ENCOUNTER — Other Ambulatory Visit (HOSPITAL_COMMUNITY): Payer: Self-pay | Admitting: *Deleted

## 2013-06-18 MED ORDER — LOSARTAN POTASSIUM 25 MG PO TABS: 25.0000 mg | ORAL_TABLET | Freq: Every day | ORAL | Status: DC

## 2013-06-20 ENCOUNTER — Ambulatory Visit (HOSPITAL_BASED_OUTPATIENT_CLINIC_OR_DEPARTMENT_OTHER): Payer: PRIVATE HEALTH INSURANCE | Admitting: Adult Health

## 2013-06-20 ENCOUNTER — Encounter: Payer: Self-pay | Admitting: Adult Health

## 2013-06-20 ENCOUNTER — Other Ambulatory Visit (HOSPITAL_BASED_OUTPATIENT_CLINIC_OR_DEPARTMENT_OTHER): Payer: PRIVATE HEALTH INSURANCE | Admitting: Lab

## 2013-06-20 ENCOUNTER — Ambulatory Visit (HOSPITAL_BASED_OUTPATIENT_CLINIC_OR_DEPARTMENT_OTHER): Payer: PRIVATE HEALTH INSURANCE

## 2013-06-20 ENCOUNTER — Telehealth: Payer: Self-pay | Admitting: *Deleted

## 2013-06-20 ENCOUNTER — Telehealth: Payer: Self-pay | Admitting: Oncology

## 2013-06-20 VITALS — BP 100/66 | HR 81 | Temp 98.6°F | Resp 20 | Ht 61.0 in | Wt 91.5 lb

## 2013-06-20 DIAGNOSIS — C787 Secondary malignant neoplasm of liver and intrahepatic bile duct: Secondary | ICD-10-CM

## 2013-06-20 DIAGNOSIS — C50919 Malignant neoplasm of unspecified site of unspecified female breast: Secondary | ICD-10-CM

## 2013-06-20 DIAGNOSIS — C773 Secondary and unspecified malignant neoplasm of axilla and upper limb lymph nodes: Secondary | ICD-10-CM

## 2013-06-20 DIAGNOSIS — Z5112 Encounter for antineoplastic immunotherapy: Secondary | ICD-10-CM

## 2013-06-20 LAB — COMPREHENSIVE METABOLIC PANEL (CC13)
ALT: 11 U/L (ref 0–55)
AST: 17 U/L (ref 5–34)
Albumin: 3.5 g/dL (ref 3.5–5.0)
Alkaline Phosphatase: 82 U/L (ref 40–150)
Glucose: 92 mg/dl (ref 70–140)
Potassium: 3.8 mEq/L (ref 3.5–5.1)
Sodium: 142 mEq/L (ref 136–145)
Total Protein: 7.2 g/dL (ref 6.4–8.3)

## 2013-06-20 LAB — CBC WITH DIFFERENTIAL/PLATELET
BASO%: 0.6 % (ref 0.0–2.0)
Basophils Absolute: 0 10*3/uL (ref 0.0–0.1)
HCT: 38 % (ref 34.8–46.6)
HGB: 13.4 g/dL (ref 11.6–15.9)
MONO#: 0.5 10*3/uL (ref 0.1–0.9)
NEUT%: 57.4 % (ref 38.4–76.8)
RDW: 12.1 % (ref 11.2–14.5)
WBC: 7.3 10*3/uL (ref 3.9–10.3)
lymph#: 2.5 10*3/uL (ref 0.9–3.3)

## 2013-06-20 MED ORDER — HEPARIN SOD (PORK) LOCK FLUSH 100 UNIT/ML IV SOLN
500.0000 [IU] | Freq: Once | INTRAVENOUS | Status: AC | PRN
  Administered 2013-06-20: 500 [IU]
  Filled 2013-06-20: qty 5

## 2013-06-20 MED ORDER — SODIUM CHLORIDE 0.9 % IV SOLN
Freq: Once | INTRAVENOUS | Status: AC
  Administered 2013-06-20: 16:00:00 via INTRAVENOUS

## 2013-06-20 MED ORDER — SODIUM CHLORIDE 0.9 % IJ SOLN
10.0000 mL | INTRAMUSCULAR | Status: DC | PRN
  Administered 2013-06-20: 10 mL
  Filled 2013-06-20: qty 10

## 2013-06-20 MED ORDER — TRASTUZUMAB CHEMO INJECTION 440 MG
6.0000 mg/kg | Freq: Once | INTRAVENOUS | Status: AC
  Administered 2013-06-20: 231 mg via INTRAVENOUS
  Filled 2013-06-20: qty 11

## 2013-06-20 NOTE — Telephone Encounter (Signed)
Per staff message and POF I have scheduled appts.  JMW  

## 2013-06-20 NOTE — Patient Instructions (Addendum)
Doing well.  Proceed with herceptin.  Please call us if you have any questions or concerns.

## 2013-06-20 NOTE — Progress Notes (Signed)
OFFICE PROGRESS NOTE  CC Dr. Chevis Pretty Dr. 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  PRIOR THERAPY:  #1 patient originally presented in October 2008 with a palpable left breast mass. She underwent a mammogram and ultrasound on 07/25/2007. The mammogram showed at the 12:00 position 7 cm from the nipple a 1.2 x 0.9 x 0.5 cm hypoechoic mass indenting the underlying pectoralis muscle. The left axilla showed no palpable lymph nodes. She underwent a biopsy on 08/02/2007 the pathology revealed invasive ductal carcinoma intermediate to high-grade ER negative PR negative HER-2/neu positive with a proliferation marker Ki-67 of 40%. MRI of both breasts were performed on 08/12/2007 that showed a solitary enhancing mass in the upper portion of the left breast corresponding to the area of known malignancy measuring 1.3 x 1.0 x 1.0 cm.  #2 patient was subsequently treated with neoadjuvant chemotherapy consisting of Taxotere carboplatinum and Herceptin. She received a total of 6 cycles of this. Overall tolerated it well.  #3 she then went on to have a left lumpectomy with sentinel lymph node biopsy followed by radiation therapy and Herceptin. She received Herceptin weekly to complete out 1 year.  #4 in 2011 patient developed a left breast mass concerning for recurrent disease she had PET scan performed that revealed liver lesions as well as subpectoral area in the left breast that was positive. She had a biopsy performed that showed recurrence of her breast cancer. The tumor was invasive ductal carcinoma with lymphovascular invasion it was ER negative PR negative with a proliferation marker Ki-67 of 86% and HER-2/neu positive.  #5 patient then received Abraxane and Herceptin combination. Once she completed her Abraxane she went on to be on maintenance Herceptin every 3 weeks.  #6 her last staging scans were performed  in October 2013 that revealed no evidence of metastatic disease. Patient is now maintained on Herceptin every 3 weeks  #7 Patient had echocardiogram and appointment with Dr. Gala Romney who determined she had cardiomyopathy and required holding Herceptin and she was placed on Carvedilol and Losartan.  She saw Dr. Gala Romney on 05/01/13 after a couple of months of management and was determined to have fully recovered her LV function and was cleared to restart Herceptin.  We prescribed Lapatinib in the interim, however she declined therapy as she did not want to pay the co pay or look into assistance programs for this.      CURRENT THERAPY: Herceptin every 3 weeks.    INTERVAL HISTORY: [name] 47 y.o. female returns for evaluation today for her metastatic breast cancer.  She continues to do well on Herceptin therapy.  She denies fevers, chills, shortness of breath, palpitations, chest pain, or any other concerns.  A 10 point ROS is neg.   MEDICAL HISTORY: Past Medical History  Diagnosis Date  . Cancer   . Breast cancer 08-02-07    LEFT BREAST INVASIVE MAMMARY CARCINOMA  . 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  MEDICATIONS:  Current Outpatient Prescriptions  Medication Sig Dispense Refill  . acetaminophen (TYLENOL) 325 MG tablet Take 325 mg by mouth every 6 (six) hours as needed. MAY TAKE 1-2 TABS PRN HA       . carvedilol (COREG) 6.25 MG tablet Take 1 tablet (6.25 mg total) by mouth 2 (two) times daily with  a meal.  60 tablet  3  . Cholecalciferol 1000 UNITS tablet Take 1,000 Units by mouth daily.       Marland Kitchen gabapentin (NEURONTIN) 300 MG capsule TAKE ONE CAPSULE BY MOUTH AT BEDTIME, FOR HOT FLASHES  30 capsule  3  . lidocaine-prilocaine (EMLA) cream Apply 1 application topically as needed. #60g tube w/3RF-CTS 10/13/11      . LORazepam (ATIVAN) 0.5 MG tablet Take 1 tablet  (0.5 mg total) by mouth every 8 (eight) hours as needed for anxiety.  60 tablet  0  . losartan (COZAAR) 25 MG tablet Take 1 tablet (25 mg total) by mouth daily.  30 tablet  3  . mirtazapine (REMERON) 15 MG tablet TAKE ONE TABLET BY MOUTH AT BEDTIME  30 tablet  3   No current facility-administered medications for this visit.    SURGICAL HISTORY:  Past Surgical History  Procedure Laterality Date  . Cesarean section      REVIEW OF SYSTEMS:  General: fatigue (-), night sweats (-), fever (-), pain (-) Lymph: palpable nodes (-) HEENT: vision changes (-), mucositis (-), gum bleeding (-), epistaxis (-) Cardiovascular: chest pain (-), palpitations (-) Pulmonary: shortness of breath (-), dyspnea on exertion (-), cough (-), hemoptysis (-) GI:  Early satiety (-), melena (-), dysphagia (-), nausea/vomiting (-), diarrhea (-) GU: dysuria (-), hematuria (-), incontinence (-) Musculoskeletal: joint swelling (-), joint pain (-), back pain (-) Neuro: weakness (-), numbness (-), headache (-), confusion (-) Skin: Rash (-), lesions (-), dryness (-) Psych: depression (-), suicidal/homicidal ideation (-), feeling of hopelessness (-)  PHYSICAL EXAMINATION: Blood pressure 100/66, pulse 81, temperature 98.6 F (37 C), temperature source Oral, resp. rate 20, height 5\' 1"  (1.549 m), weight 91 lb 8 oz (41.504 kg). Body mass index is 17.3 kg/(m^2). General: Patient is a well appearing female in no acute distress HEENT: PERRLA, sclerae anicteric no conjunctival pallor, MMM Neck: supple, no palpable adenopathy Lungs: clear to auscultation bilaterally, no wheezes, rhonchi, or rales Cardiovascular: regular rate rhythm, S1, S2, no murmurs, rubs or gallops Abdomen: Soft, non-tender, non-distended, normoactive bowel sounds, no HSM Extremities: warm and well perfused, no clubbing, cyanosis, or edema Skin: No rashes or lesions Neuro: Non-focal Breasts: left breast lumpectomy site well healed, no nodularity, masses,  right breast no nodularity, masses ECOG PERFORMANCE STATUS: 1 - Symptomatic but completely ambulatory   LABORATORY DATA: Lab Results  Component Value Date   WBC 7.3 06/20/2013   HGB 13.4 06/20/2013   HCT 38.0 06/20/2013   MCV 96.4 06/20/2013   PLT 191 06/20/2013      Chemistry      Component Value Date/Time   NA 142 06/20/2013 1359   NA 138 08/02/2012 1352   K 3.8 06/20/2013 1359   K 3.7 08/02/2012 1352   CL 103 03/21/2013 1300   CL 100 08/02/2012 1352   CO2 28 06/20/2013 1359   CO2 31 08/02/2012 1352   BUN 16.4 06/20/2013 1359   BUN 11 08/02/2012 1352   CREATININE 0.8 06/20/2013 1359   CREATININE 0.66 08/02/2012 1352      Component Value Date/Time   CALCIUM 10.1 06/20/2013 1359   CALCIUM 9.2 08/02/2012 1352   ALKPHOS 82 06/20/2013 1359   ALKPHOS 81 08/02/2012 1352   AST 17 06/20/2013 1359   AST 15 08/02/2012 1352   ALT 11 06/20/2013 1359   ALT 9 08/02/2012 1352   BILITOT 0.39 06/20/2013 1359   BILITOT 0.2* 08/02/2012 1352       RADIOGRAPHIC STUDIES:  No results found.  ASSESSMENT: 47 year old female with  #1 metastatic invasive ductal carcinoma with liver metastasis. Patient's original diagnosis was in 2008 and a clinical stage I. At that time she underwent neoadjuvant chemotherapy consisting of Taxotere carboplatinum Herceptin followed by a lumpectomy sentinel lymph node biopsy. She also received radiation and then subsequently finished out 1 year of adjuvant Herceptin. Her tumor was ER negative so she did not receive antiestrogen therapy.  #2 patient had recurrent disease in 2011 with liver metastasis. Again tumor was ER negative PR negative HER-2 positive. She initially received Abraxane and Herceptin combination. Once she completed Abraxane she went on to be maintained on Herceptin every 3 weeks.  During a routine echo she was found to have a low normal EF.  She was evaluated with a MUGA scan and by Dr. Gala Romney.  She iscurrently taking Losartan and Carvedilol.  She will f/u  with him in 3-4 weeks for repeat echo.     PLAN:   #1Ms. Tuch is doing well today.  She will proceed with Herceptin.  She is doing well with this.    #2 She will return to clinic in 3 weeks.  We requested more appointments today to be scheduled.    All questions were answered. The patient knows to call the clinic with any problems, questions or concerns. We can certainly see the patient much sooner if necessary.  I spent 15 minutes counseling the patient face to face. The total time spent in the appointment was 30 minutes.  Cherie Ouch Lyn Hollingshead, NP Medical Oncology [facility] Phone: [phone] 06/20/2013, 2:55 PM

## 2013-06-20 NOTE — Patient Instructions (Addendum)
[facility] Discharge Instructions for Patients Receiving Chemotherapy  Today you received the following chemotherapy agents herceptin   If you develop nausea and vomiting that is not controlled by your nausea medication, call the clinic.   BELOW ARE SYMPTOMS THAT SHOULD BE REPORTED IMMEDIATELY:  *FEVER GREATER THAN 100.5 F  *CHILLS WITH OR WITHOUT FEVER  NAUSEA AND VOMITING THAT IS NOT CONTROLLED WITH YOUR NAUSEA MEDICATION  *UNUSUAL SHORTNESS OF BREATH  *UNUSUAL BRUISING OR BLEEDING  TENDERNESS IN MOUTH AND THROAT WITH OR WITHOUT PRESENCE OF ULCERS  *URINARY PROBLEMS  *BOWEL PROBLEMS  UNUSUAL RASH Items with * indicate a potential emergency and should be followed up as soon as possible.  Feel free to call the clinic you have any questions or concerns. The clinic phone number is [phone].  

## 2013-06-20 NOTE — Progress Notes (Signed)
Pt states she does not want her Tylenol or Benadryl today.

## 2013-06-20 NOTE — Telephone Encounter (Signed)
m °

## 2013-07-03 ENCOUNTER — Other Ambulatory Visit: Payer: Self-pay | Admitting: Oncology

## 2013-07-03 DIAGNOSIS — Z853 Personal history of malignant neoplasm of breast: Secondary | ICD-10-CM

## 2013-07-03 DIAGNOSIS — Z9889 Other specified postprocedural states: Secondary | ICD-10-CM

## 2013-07-03 NOTE — Progress Notes (Addendum)
FAXED UPDATE TO Greig Castilla, RN [facility] CASE MANAGER @ [employer] [phone] HER PHONE NUMBER IS [phone].  09/17/13 HER NEW CASE MANAGER IS SUZANNE WHO IS OUT AND I LEFT A MESSAGE FOR [place], RN [phone]

## 2013-07-09 ENCOUNTER — Other Ambulatory Visit: Payer: Self-pay | Admitting: *Deleted

## 2013-07-09 DIAGNOSIS — C50919 Malignant neoplasm of unspecified site of unspecified female breast: Secondary | ICD-10-CM

## 2013-07-09 MED ORDER — LORAZEPAM 0.5 MG PO TABS: 0.5000 mg | ORAL_TABLET | Freq: Three times a day (TID) | ORAL | Status: AC | PRN

## 2013-07-11 ENCOUNTER — Ambulatory Visit: Payer: PRIVATE HEALTH INSURANCE | Admitting: Adult Health

## 2013-07-11 ENCOUNTER — Other Ambulatory Visit: Payer: PRIVATE HEALTH INSURANCE | Admitting: Lab

## 2013-07-11 ENCOUNTER — Ambulatory Visit: Payer: PRIVATE HEALTH INSURANCE

## 2013-08-01 ENCOUNTER — Telehealth (HOSPITAL_COMMUNITY): Payer: Self-pay | Admitting: Cardiology

## 2013-08-01 ENCOUNTER — Other Ambulatory Visit: Payer: Self-pay | Admitting: Nurse Practitioner

## 2013-08-01 ENCOUNTER — Encounter (INDEPENDENT_AMBULATORY_CARE_PROVIDER_SITE_OTHER): Payer: Self-pay

## 2013-08-01 ENCOUNTER — Ambulatory Visit (HOSPITAL_COMMUNITY)
Admission: RE | Admit: 2013-08-01 | Discharge: 2013-08-01 | Disposition: A | Discharge status: Home / Self Care | Payer: PRIVATE HEALTH INSURANCE | Source: Ambulatory Visit | Attending: Nurse Practitioner | Admitting: Nurse Practitioner

## 2013-08-01 ENCOUNTER — Other Ambulatory Visit (HOSPITAL_BASED_OUTPATIENT_CLINIC_OR_DEPARTMENT_OTHER): Payer: PRIVATE HEALTH INSURANCE | Admitting: Lab

## 2013-08-01 ENCOUNTER — Ambulatory Visit (HOSPITAL_BASED_OUTPATIENT_CLINIC_OR_DEPARTMENT_OTHER): Payer: PRIVATE HEALTH INSURANCE

## 2013-08-01 ENCOUNTER — Ambulatory Visit (HOSPITAL_BASED_OUTPATIENT_CLINIC_OR_DEPARTMENT_OTHER): Payer: PRIVATE HEALTH INSURANCE | Admitting: Physician Assistant

## 2013-08-01 ENCOUNTER — Encounter: Payer: Self-pay | Admitting: Physician Assistant

## 2013-08-01 DIAGNOSIS — C50919 Malignant neoplasm of unspecified site of unspecified female breast: Secondary | ICD-10-CM

## 2013-08-01 DIAGNOSIS — C773 Secondary and unspecified malignant neoplasm of axilla and upper limb lymph nodes: Secondary | ICD-10-CM

## 2013-08-01 DIAGNOSIS — C787 Secondary malignant neoplasm of liver and intrahepatic bile duct: Secondary | ICD-10-CM

## 2013-08-01 DIAGNOSIS — R220 Localized swelling, mass and lump, head: Secondary | ICD-10-CM

## 2013-08-01 DIAGNOSIS — Y849 Medical procedure, unspecified as the cause of abnormal reaction of the patient, or of later complication, without mention of misadventure at the time of the procedure: Secondary | ICD-10-CM | POA: Insufficient documentation

## 2013-08-01 DIAGNOSIS — Z5112 Encounter for antineoplastic immunotherapy: Secondary | ICD-10-CM

## 2013-08-01 DIAGNOSIS — T82598A Other mechanical complication of other cardiac and vascular devices and implants, initial encounter: Secondary | ICD-10-CM | POA: Insufficient documentation

## 2013-08-01 LAB — CBC WITH DIFFERENTIAL/PLATELET
BASO%: 0.9 % (ref 0.0–2.0)
EOS%: 1 % (ref 0.0–7.0)
LYMPH%: 20.4 % (ref 14.0–49.7)
MCH: 33.4 pg (ref 25.1–34.0)
MCHC: 34.2 g/dL (ref 31.5–36.0)
MCV: 97.5 fL (ref 79.5–101.0)
MONO%: 4.9 % (ref 0.0–14.0)
NEUT#: 5.4 10*3/uL (ref 1.5–6.5)
Platelets: 216 10*3/uL (ref 145–400)
RBC: 3.92 10*6/uL (ref 3.70–5.45)
RDW: 11.8 % (ref 11.2–14.5)

## 2013-08-01 LAB — COMPREHENSIVE METABOLIC PANEL (CC13)
Albumin: 3.1 g/dL — ABNORMAL LOW (ref 3.5–5.0)
Anion Gap: 7 mEq/L (ref 3–11)
BUN: 13.5 mg/dL (ref 7.0–26.0)
CO2: 30 mEq/L — ABNORMAL HIGH (ref 22–29)
Calcium: 9.5 mg/dL (ref 8.4–10.4)
Glucose: 113 mg/dl (ref 70–140)
Potassium: 3.6 mEq/L (ref 3.5–5.1)
Sodium: 139 mEq/L (ref 136–145)
Total Bilirubin: 0.57 mg/dL (ref 0.20–1.20)
Total Protein: 7.4 g/dL (ref 6.4–8.3)

## 2013-08-01 MED ORDER — SODIUM CHLORIDE 0.9 % IV SOLN: Freq: Once | INTRAVENOUS | Status: DC

## 2013-08-01 MED ORDER — ACETAMINOPHEN 325 MG PO TABS: 650.0000 mg | ORAL_TABLET | Freq: Once | ORAL | Status: DC

## 2013-08-01 MED ORDER — HYDROCODONE-ACETAMINOPHEN 5-325 MG PO TABS: 1.0000 | ORAL_TABLET | Freq: Four times a day (QID) | ORAL | Status: DC | PRN

## 2013-08-01 MED ORDER — ACETAMINOPHEN 325 MG PO TABS
ORAL_TABLET | ORAL | Status: AC
  Filled 2013-08-01: qty 2

## 2013-08-01 MED ORDER — TRASTUZUMAB CHEMO INJECTION 440 MG
6.0000 mg/kg | Freq: Once | INTRAVENOUS | Status: AC
  Administered 2013-08-01: 231 mg via INTRAVENOUS
  Filled 2013-08-01: qty 11

## 2013-08-01 MED ORDER — HEPARIN SOD (PORK) LOCK FLUSH 100 UNIT/ML IV SOLN
500.0000 [IU] | Freq: Once | INTRAVENOUS | Status: DC | PRN
  Filled 2013-08-01: qty 5

## 2013-08-01 MED ORDER — SODIUM CHLORIDE 0.9 % IJ SOLN
10.0000 mL | INTRAMUSCULAR | Status: DC | PRN
  Filled 2013-08-01: qty 10

## 2013-08-01 NOTE — Progress Notes (Signed)
PAC accessed with no problems, flushed easily, and blood return noted and normal saline hung. After 15 minutes, patient noticed pain at [facility] site that radiated up into her neck. Upon assessment, neck was swollen and slightly red. Fluids were immediately stopped and PA notified. Tiana Loft, PA came to infusion room to assess site. Orders were placed for chest xray and dye study. This RN confirmed xray to be done today after patient finished treatment and dye study scheduled for Tuesday, 08/05/13 at 2pm. [facility] deaccessed with no further complications.

## 2013-08-01 NOTE — Patient Instructions (Signed)
[facility] Discharge Instructions for Patients Receiving Chemotherapy  Today you received the following chemotherapy agents Herceptin  To help prevent nausea and vomiting after your treatment, we encourage you to take your nausea medication     If you develop nausea and vomiting that is not controlled by your nausea medication, call the clinic.   BELOW ARE SYMPTOMS THAT SHOULD BE REPORTED IMMEDIATELY:  *FEVER GREATER THAN 100.5 F  *CHILLS WITH OR WITHOUT FEVER  NAUSEA AND VOMITING THAT IS NOT CONTROLLED WITH YOUR NAUSEA MEDICATION  *UNUSUAL SHORTNESS OF BREATH  *UNUSUAL BRUISING OR BLEEDING  TENDERNESS IN MOUTH AND THROAT WITH OR WITHOUT PRESENCE OF ULCERS  *URINARY PROBLEMS  *BOWEL PROBLEMS  UNUSUAL RASH Items with * indicate a potential emergency and should be followed up as soon as possible.  Feel free to call the clinic you have any questions or concerns. The clinic phone number is [phone].    

## 2013-08-01 NOTE — Telephone Encounter (Signed)
Order placed for upcoming eCHO

## 2013-08-04 ENCOUNTER — Ambulatory Visit
Admission: RE | Admit: 2013-08-04 | Discharge: 2013-08-04 | Disposition: A | Discharge status: Home / Self Care | Payer: PRIVATE HEALTH INSURANCE | Source: Ambulatory Visit | Attending: Oncology | Admitting: Oncology

## 2013-08-04 DIAGNOSIS — Z853 Personal history of malignant neoplasm of breast: Secondary | ICD-10-CM

## 2013-08-04 DIAGNOSIS — Z9889 Other specified postprocedural states: Secondary | ICD-10-CM

## 2013-08-05 ENCOUNTER — Ambulatory Visit (HOSPITAL_COMMUNITY)
Admission: RE | Admit: 2013-08-05 | Discharge: 2013-08-05 | Disposition: A | Discharge status: Home / Self Care | Payer: PRIVATE HEALTH INSURANCE | Source: Ambulatory Visit | Attending: Nurse Practitioner | Admitting: Nurse Practitioner

## 2013-08-05 ENCOUNTER — Other Ambulatory Visit: Payer: Self-pay | Admitting: Oncology

## 2013-08-05 DIAGNOSIS — R220 Localized swelling, mass and lump, head: Secondary | ICD-10-CM | POA: Insufficient documentation

## 2013-08-05 DIAGNOSIS — Y849 Medical procedure, unspecified as the cause of abnormal reaction of the patient, or of later complication, without mention of misadventure at the time of the procedure: Secondary | ICD-10-CM | POA: Insufficient documentation

## 2013-08-05 DIAGNOSIS — T82598A Other mechanical complication of other cardiac and vascular devices and implants, initial encounter: Secondary | ICD-10-CM | POA: Insufficient documentation

## 2013-08-05 DIAGNOSIS — C50919 Malignant neoplasm of unspecified site of unspecified female breast: Secondary | ICD-10-CM | POA: Insufficient documentation

## 2013-08-05 MED ORDER — IOHEXOL 300 MG/ML  SOLN
10.0000 mL | Freq: Once | INTRAMUSCULAR | Status: AC | PRN
  Administered 2013-08-05: 3 mL via INTRAVENOUS

## 2013-08-05 NOTE — Patient Instructions (Signed)
You will have a CXR and a dye study to evaluate your port a cath Follow up in 3 weeks or sooner pending the ourcome of the studies to evaluate your port a cath

## 2013-08-05 NOTE — Procedures (Signed)
Port injection confirms fracture of the catheter tubing regional the port reservoir.   The port is no longer safe for medication administration or blood draws and will be revised later this week.

## 2013-08-05 NOTE — Progress Notes (Signed)
OFFICE PROGRESS NOTE  CC Dr. Chevis Pretty Dr. 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  PRIOR THERAPY:  #1 patient originally presented in October 2008 with a palpable left breast mass. She underwent a mammogram and ultrasound on 07/25/2007. The mammogram showed at the 12:00 position 7 cm from the nipple a 1.2 x 0.9 x 0.5 cm hypoechoic mass indenting the underlying pectoralis muscle. The left axilla showed no palpable lymph nodes. She underwent a biopsy on 08/02/2007 the pathology revealed invasive ductal carcinoma intermediate to high-grade ER negative PR negative HER-2/neu positive with a proliferation marker Ki-67 of 40%. MRI of both breasts were performed on 08/12/2007 that showed a solitary enhancing mass in the upper portion of the left breast corresponding to the area of known malignancy measuring 1.3 x 1.0 x 1.0 cm.  #2 patient was subsequently treated with neoadjuvant chemotherapy consisting of Taxotere carboplatinum and Herceptin. She received a total of 6 cycles of this. Overall tolerated it well.  #3 she then went on to have a left lumpectomy with sentinel lymph node biopsy followed by radiation therapy and Herceptin. She received Herceptin weekly to complete out 1 year.  #4 in 2011 patient developed a left breast mass concerning for recurrent disease she had PET scan performed that revealed liver lesions as well as subpectoral area in the left breast that was positive. She had a biopsy performed that showed recurrence of her breast cancer. The tumor was invasive ductal carcinoma with lymphovascular invasion it was ER negative PR negative with a proliferation marker Ki-67 of 86% and HER-2/neu positive.  #5 patient then received Abraxane and Herceptin combination. Once she completed her Abraxane she went on to be on maintenance Herceptin every 3 weeks.  #6 her last staging scans were performed  in October 2013 that revealed no evidence of metastatic disease. Patient is now maintained on Herceptin every 3 weeks  #7 Patient had echocardiogram and appointment with Dr. Gala Romney who determined she had cardiomyopathy and required holding Herceptin and she was placed on Carvedilol and Losartan.  She saw Dr. Gala Romney on 05/01/13 after a couple of months of management and was determined to have fully recovered her LV function and was cleared to restart Herceptin.  We prescribed Lapatinib in the interim, however she declined therapy as she did not want to pay the co pay or look into assistance programs for this.      CURRENT THERAPY: Herceptin every 3 weeks.    INTERVAL HISTORY: [name] 47 y.o. female returns for evaluation today for her metastatic breast cancer.  She continues to do well on Herceptin therapy.  She complains of back pain that has been present for approximately 2 weeks. The pain occasionally extends toward the right rib cage area. The pain begins at the neck and extensive low back. Is not associated with any numbness or tingling. She's had no problems with bowel or bladder control. She did not obtain any relief with Aleve. She is status post 10 cycles of Herceptin given every 3 weeks and overall is tolerating this therapy without difficulty. She presents today for cycle #11. She is scheduled to have a followup echocardiogram and appointment with Dr. Gala Romney on 08/18/2013. She denies fevers, chills, shortness of breath, palpitations, chest pain, or any other concerns.  A 10 point ROS is neg.   MEDICAL HISTORY: Past Medical History  Diagnosis Date  . Cancer   . Breast cancer 08-02-07  LEFT BREAST INVASIVE MAMMARY CARCINOMA  . 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  MEDICATIONS:  Current Outpatient Prescriptions  Medication Sig Dispense Refill  .  acetaminophen (TYLENOL) 325 MG tablet Take 325 mg by mouth every 6 (six) hours as needed. MAY TAKE 1-2 TABS PRN HA       . carvedilol (COREG) 6.25 MG tablet Take 1 tablet (6.25 mg total) by mouth 2 (two) times daily with a meal.  60 tablet  3  . Cholecalciferol 1000 UNITS tablet Take 1,000 Units by mouth daily.       Marland Kitchen gabapentin (NEURONTIN) 300 MG capsule TAKE ONE CAPSULE BY MOUTH AT BEDTIME, FOR HOT FLASHES  30 capsule  3  . HYDROcodone-acetaminophen (NORCO/VICODIN) 5-325 MG per tablet Take 1 tablet by mouth every 6 (six) hours as needed for pain.  30 tablet  0  . lidocaine-prilocaine (EMLA) cream Apply 1 application topically as needed. #60g tube w/3RF-CTS 10/13/11      . LORazepam (ATIVAN) 0.5 MG tablet Take 1 tablet (0.5 mg total) by mouth every 8 (eight) hours as needed for anxiety (or sleep).  60 tablet  0  . losartan (COZAAR) 25 MG tablet Take 1 tablet (25 mg total) by mouth daily.  30 tablet  3  . mirtazapine (REMERON) 15 MG tablet TAKE ONE TABLET BY MOUTH AT BEDTIME  30 tablet  3   No current facility-administered medications for this visit.    SURGICAL HISTORY:  Past Surgical History  Procedure Laterality Date  . Cesarean section      REVIEW OF SYSTEMS:  General: fatigue (-), night sweats (-), fever (-), pain (-) Lymph: palpable nodes (-) HEENT: vision changes (-), mucositis (-), gum bleeding (-), epistaxis (-) Cardiovascular: chest pain (-), palpitations (-) Pulmonary: shortness of breath (-), dyspnea on exertion (-), cough (-), hemoptysis (-) GI:  Early satiety (-), melena (-), dysphagia (-), nausea/vomiting (-), diarrhea (-) GU: dysuria (-), hematuria (-), incontinence (-) Musculoskeletal: joint swelling (-), joint pain (-), back pain (+) Neuro: weakness (-), numbness (-), headache (-), confusion (-) Skin: Rash (-), lesions (-), dryness (-) Psych: depression (-), suicidal/homicidal ideation (-), feeling of hopelessness (-)  PHYSICAL EXAMINATION: There were no vitals  taken for this visit. There is no weight on file to calculate BMI. General: Patient is a well appearing female in no acute distress HEENT: PERRLA, sclerae anicteric no conjunctival pallor, no evidence of thrush or mucositis Neck: supple, no palpable adenopathy Lungs: clear to auscultation bilaterally, no wheezes, rhonchi, or rales Cardiovascular: regular rate rhythm, S1, S2, no murmurs, rubs or gallops Abdomen: Soft, non-tender, non-distended, normoactive bowel sounds, no HSM Extremities: warm and well perfused, no clubbing, cyanosis, or edema Back: No areas of point tenderness along the vertebral bodies of the cervical thoracic or lumbar spine Skin: No rashes or lesions Neuro: Non-focal Breasts: exam deferred  ECOG PERFORMANCE STATUS: 1 - Symptomatic but completely ambulatory   LABORATORY DATA: Lab Results  Component Value Date   WBC 7.5 08/01/2013   HGB 13.1 08/01/2013   HCT 38.2 08/01/2013   MCV 97.5 08/01/2013   PLT 216 08/01/2013      Chemistry      Component Value Date/Time   NA 139 08/01/2013 1321   NA 138 08/02/2012 1352   K 3.6 08/01/2013 1321   K 3.7 08/02/2012 1352   CL 103 03/21/2013 1300  CL 100 08/02/2012 1352   CO2 30* 08/01/2013 1321   CO2 31 08/02/2012 1352   BUN 13.5 08/01/2013 1321   BUN 11 08/02/2012 1352   CREATININE 0.8 08/01/2013 1321   CREATININE 0.66 08/02/2012 1352      Component Value Date/Time   CALCIUM 9.5 08/01/2013 1321   CALCIUM 9.2 08/02/2012 1352   ALKPHOS 345* 08/01/2013 1321   ALKPHOS 81 08/02/2012 1352   AST 53* 08/01/2013 1321   AST 15 08/02/2012 1352   ALT 55 08/01/2013 1321   ALT 9 08/02/2012 1352   BILITOT 0.57 08/01/2013 1321   BILITOT 0.2* 08/02/2012 1352       RADIOGRAPHIC STUDIES:  No results found.  ASSESSMENT: 47 year old female with  #1 metastatic invasive ductal carcinoma with liver metastasis. Patient's original diagnosis was in 2008 and a clinical stage I. At that time she underwent neoadjuvant  chemotherapy consisting of Taxotere carboplatinum Herceptin followed by a lumpectomy sentinel lymph node biopsy. She also received radiation and then subsequently finished out 1 year of adjuvant Herceptin. Her tumor was ER negative so she did not receive antiestrogen therapy.  #2 patient had recurrent disease in 2011 with liver metastasis. Again tumor was ER negative PR negative HER-2 positive. She initially received Abraxane and Herceptin combination. Once she completed Abraxane she went on to be maintained on Herceptin every 3 weeks.  During a routine echo she was found to have a low normal EF.  She was evaluated with a MUGA scan and by Dr. Gala Romney.  She iscurrently taking Losartan and Carvedilol.  She will f/u with him in 3-4 weeks for repeat echo.     PLAN:   #1Ms. Ibarra is doing well today except for back pain. She is given a prescription for Vicodin 5/325 mg tablets a total of 30 with no refill. She will proceed with Herceptin.  She continues to do well with this.    #2 She will return to clinic in 3 weeks, with repeat labs and another symptom management visit prior to proceeding with cycle 12 of her Herceptin.  Patient encouraged to keep her followup appointment for her echocardiogram and with Dr. Gala Romney.   All questions were answered. The patient knows to call the clinic with any problems, questions or concerns. We can certainly see the patient much sooner if necessary.  I spent 15 minutes counseling the patient face to face. The total time spent in the appointment was 30 minutes.  Conni Slipper PA-C  Medical Oncology [facility] Phone: [phone] 08/05/2013, 8:19 PM    Addendum: Notified by infusion nurses that patient's right neck area became swollen after infusing small amount of saline to your her Port-A-Cath. Port-A-Cath was T. access the right neck swelling resolved on its own. I spoke with Dr. gross, surgeon regarding the Port-A-Cath; surgeon so  there will was some sort of a defect related to the Port-A-Cath, either a crack in the tubing or other abnormality. Patient has had this Port-A-Cath for approximately 3 years. He recommended obtaining a chest x-ray in a dye study to further evaluate the integrity of a Port-A-Cath. The studies were ordered and transplantation management of Port-A-Cath will depend on the results of these studies.  Laural Benes, Joye Wesenberg E, PA-C

## 2013-08-06 ENCOUNTER — Other Ambulatory Visit: Payer: Self-pay | Admitting: Radiology

## 2013-08-06 ENCOUNTER — Encounter (HOSPITAL_COMMUNITY): Payer: Self-pay | Admitting: Pharmacy Technician

## 2013-08-07 ENCOUNTER — Ambulatory Visit (HOSPITAL_COMMUNITY)
Admission: RE | Admit: 2013-08-07 | Discharge: 2013-08-07 | Disposition: A | Discharge status: Home / Self Care | Payer: PRIVATE HEALTH INSURANCE | Source: Ambulatory Visit | Attending: Oncology | Admitting: Oncology

## 2013-08-07 ENCOUNTER — Other Ambulatory Visit: Payer: Self-pay | Admitting: Oncology

## 2013-08-07 ENCOUNTER — Encounter (HOSPITAL_COMMUNITY): Payer: Self-pay

## 2013-08-07 DIAGNOSIS — C787 Secondary malignant neoplasm of liver and intrahepatic bile duct: Secondary | ICD-10-CM | POA: Insufficient documentation

## 2013-08-07 DIAGNOSIS — C50919 Malignant neoplasm of unspecified site of unspecified female breast: Secondary | ICD-10-CM | POA: Insufficient documentation

## 2013-08-07 DIAGNOSIS — C773 Secondary and unspecified malignant neoplasm of axilla and upper limb lymph nodes: Secondary | ICD-10-CM | POA: Insufficient documentation

## 2013-08-07 DIAGNOSIS — Y849 Medical procedure, unspecified as the cause of abnormal reaction of the patient, or of later complication, without mention of misadventure at the time of the procedure: Secondary | ICD-10-CM | POA: Insufficient documentation

## 2013-08-07 DIAGNOSIS — T82598A Other mechanical complication of other cardiac and vascular devices and implants, initial encounter: Secondary | ICD-10-CM | POA: Insufficient documentation

## 2013-08-07 DIAGNOSIS — F17200 Nicotine dependence, unspecified, uncomplicated: Secondary | ICD-10-CM | POA: Insufficient documentation

## 2013-08-07 DIAGNOSIS — Z79899 Other long term (current) drug therapy: Secondary | ICD-10-CM | POA: Insufficient documentation

## 2013-08-07 HISTORY — DX: Lymphedema, not elsewhere classified: I89.0

## 2013-08-07 MED ORDER — FENTANYL CITRATE 0.05 MG/ML IJ SOLN
INTRAMUSCULAR | Status: AC | PRN
  Administered 2013-08-07: 50 ug via INTRAVENOUS
  Administered 2013-08-07: 100 ug via INTRAVENOUS

## 2013-08-07 MED ORDER — CEFAZOLIN SODIUM-DEXTROSE 2-3 GM-% IV SOLR
2.0000 g | INTRAVENOUS | Status: AC
  Administered 2013-08-07: 2 g via INTRAVENOUS

## 2013-08-07 MED ORDER — FENTANYL CITRATE 0.05 MG/ML IJ SOLN
INTRAMUSCULAR | Status: AC
  Filled 2013-08-07: qty 4

## 2013-08-07 MED ORDER — HEPARIN SOD (PORK) LOCK FLUSH 100 UNIT/ML IV SOLN: 500.0000 [IU] | Freq: Once | INTRAVENOUS | Status: DC

## 2013-08-07 MED ORDER — CEFAZOLIN SODIUM-DEXTROSE 2-3 GM-% IV SOLR
INTRAVENOUS | Status: AC
  Filled 2013-08-07: qty 50

## 2013-08-07 MED ORDER — MIDAZOLAM HCL 2 MG/2ML IJ SOLN
INTRAMUSCULAR | Status: AC
  Filled 2013-08-07: qty 4

## 2013-08-07 MED ORDER — SODIUM CHLORIDE 0.9 % IV SOLN
INTRAVENOUS | Status: DC
  Administered 2013-08-07: 12:00:00 via INTRAVENOUS

## 2013-08-07 MED ORDER — MIDAZOLAM HCL 2 MG/2ML IJ SOLN
INTRAMUSCULAR | Status: AC | PRN
  Administered 2013-08-07: 2 mg via INTRAVENOUS
  Administered 2013-08-07: 1 mg via INTRAVENOUS

## 2013-08-07 MED ORDER — LIDOCAINE-EPINEPHRINE (PF) 2 %-1:200000 IJ SOLN
INTRAMUSCULAR | Status: AC
  Filled 2013-08-07: qty 20

## 2013-08-07 MED ORDER — LIDOCAINE HCL 1 % IJ SOLN
INTRAMUSCULAR | Status: AC
  Filled 2013-08-07: qty 20

## 2013-08-07 NOTE — H&P (Signed)
[name] is an 47 y.o. female.   Chief Complaint: "I'm here to get my port out and get another one" HPI: Patient with history of metastatic breast carcinoma and recently noted fractured tubing of existing rt chest wall port a cath . She presents today for removal of existing port and placement of new port a cath.  Past Medical History  Diagnosis Date  . Cancer   . Breast cancer 08-02-07    LEFT BREAST INVASIVE MAMMARY CARCINOMA  . 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 Cesarean section      Family History  Problem Relation Age of Onset  . Cancer Mother   . Heart disease Father   . Hypertension Brother    Social History:  reports that she has been smoking Cigarettes.  She has a 30 pack-year smoking history. She does not have any smokeless tobacco history on file. She reports that she drinks alcohol. Her drug history is not on file.  Allergies:  Allergies  Allergen Reactions  . Effexor Xr [Venlafaxine Hcl Er] Diarrhea  . Megace [Megestrol] Rash    Current outpatient prescriptions:BIOTIN PO, Take 1 tablet by mouth daily as needed (For nail and hair growth.)., Disp: , Rfl: ;  carvedilol (COREG) 6.25 MG tablet, Take 1 tablet (6.25 mg total) by mouth 2 (two) times daily with a meal., Disp: 60 tablet, Rfl: 3;  Cholecalciferol 1000 UNITS tablet, Take 1,000 Units by mouth daily. , Disp: , Rfl: ;  gabapentin (NEURONTIN) 300 MG capsule, Take 300 mg by mouth at bedtime., Disp: , Rfl:  HYDROcodone-acetaminophen (NORCO/VICODIN) 5-325 MG per tablet, Take 1 tablet by mouth every 6 (six) hours as needed for pain., Disp: 30 tablet, Rfl: 0;  lidocaine-prilocaine (EMLA) cream, Apply 1 application topically daily as needed (Applies to port-a-cath.). , Disp: , Rfl: ;  LORazepam (ATIVAN) 0.5 MG tablet, Take 1 tablet (0.5 mg total) by mouth every 8 (eight) hours as needed for anxiety (or sleep)., Disp:  60 tablet, Rfl: 0 losartan (COZAAR) 25 MG tablet, Take 25 mg by mouth at bedtime., Disp: , Rfl: ;  mirtazapine (REMERON) 15 MG tablet, Take 15 mg by mouth at bedtime., Disp: , Rfl: ;  PRESCRIPTION MEDICATION, She receives her Herceptin treatments every 3 weeks at the [facility] with Dr. Park Breed. Her last dose of Herceptin was on 08/01/13., Disp: , Rfl:  Current facility-administered medications:0.9 %  sodium chloride infusion, , Intravenous, Continuous, D Kevin Allred, PA-C, Last Rate: 20 mL/hr at 08/07/13 1155;  ceFAZolin (ANCEF) IVPB 2 g/50 mL premix, 2 g, Intravenous, On Call, D Kevin Allred, PA-C Facility-Administered Medications Ordered in Other Encounters: ceFAZolin (ANCEF) 2-3 GM-% IVPB SOLR, , , , ;  fentaNYL (SUBLIMAZE) 0.05 MG/ML injection, , , , ;  lidocaine (XYLOCAINE) 1 % (with pres) injection, , , , ;  lidocaine-EPINEPHrine (XYLOCAINE W/EPI) 2 %-1:200000 (PF) injection, , , , ;  midazolam (VERSED) 2 MG/2ML injection, , , ,   No results found for this or any previous visit (from the past 48 hour(s)). Ir Cv Line Injection  08/05/2013   *RADIOLOGY REPORT*  Clinical Data: Neck swelling following infusion of existing port catheter.  PORT INJECTION  Comparison: Chest radiograph - 08/01/2013.  Fluoroscopy time:  6 seconds  Complications:  None immediate  Technique / Findings:  The port-a-catheter was sterilely accessed with a Huber needle.  A pre-procedural spot  radiographic image was obtained redemonstrating the suspected fracture of the proximal port catheter tubing regional to the connection with the port reservoir. Limited contrast injection confirms extravasation of contrast at this location with passage of contrast along the port catheter sheath to the level of the right internal jugular vein.  IMPRESSION: Fractured port-a-catheter tubing regional to the connection of the tubing and port catheter reservoir.  This port catheter is no longer safe for medication administration or blood  draws.  PLAN:  Above findings were discussed with Dr. Welton Flakes and the decision was made to have the patient return later this week (10/16) for port-a- catheter revision.   Original Report Authenticated By: Tacey Ruiz, MD    Review of Systems  Constitutional: Negative for fever and chills.  Respiratory: Negative for cough.   Cardiovascular: Negative for chest pain.  Gastrointestinal: Positive for abdominal pain. Negative for nausea and vomiting.  Musculoskeletal: Positive for back pain.  Neurological: Negative for headaches.  Endo/Heme/Allergies: Does not bruise/bleed easily.   Vitals: BP 92/61 HR 75  R 18  O2 SATS 99% RA Physical Exam  Constitutional: She is oriented to person, place, and time.  Cachetic appearing WF in NAD  Cardiovascular: Normal rate and regular rhythm.   Respiratory: Effort normal and breath sounds normal.  Rt chest wall PAC in place with no erythema or signs of acute infection,mildly tender to palpation  GI: Soft. Bowel sounds are normal.  Mildly tender epigastric region  Musculoskeletal: Normal range of motion. She exhibits no edema.  Neurological: She is alert and oriented to person, place, and time.     Assessment/Plan Pt with hx of metastatic breast carcinoma and fractured tubing of rt chest wall port a cath. Plan is for port removal followed by placement of a new port a cath today. Details/risks of procedure d/w pt/husband with their understanding and consent.  ALLRED,D KEVIN 08/07/2013, 12:05 PM

## 2013-08-07 NOTE — Procedures (Signed)
Successful RT IJ PORT REMOVAL FOLLOWED BY NEW RT IJ SLIM PORT INSERTION NO COMP STABLE TIP SVC/RA READY FOR USE FULL REPORT IN PACS

## 2013-08-07 NOTE — Progress Notes (Signed)
OK to discharge patient at 1430 per Dr. Denny Levy.

## 2013-08-12 ENCOUNTER — Encounter (HOSPITAL_COMMUNITY): Payer: Self-pay

## 2013-08-12 ENCOUNTER — Ambulatory Visit (HOSPITAL_COMMUNITY)
Admission: RE | Admit: 2013-08-12 | Discharge: 2013-08-12 | Disposition: A | Discharge status: Home / Self Care | Payer: PRIVATE HEALTH INSURANCE | Source: Ambulatory Visit | Attending: Internal Medicine | Admitting: Internal Medicine

## 2013-08-12 ENCOUNTER — Ambulatory Visit (HOSPITAL_BASED_OUTPATIENT_CLINIC_OR_DEPARTMENT_OTHER)
Admission: RE | Admit: 2013-08-12 | Discharge: 2013-08-12 | Disposition: A | Discharge status: Home / Self Care | Payer: PRIVATE HEALTH INSURANCE | Source: Ambulatory Visit | Attending: Internal Medicine | Admitting: Internal Medicine

## 2013-08-12 DIAGNOSIS — C50919 Malignant neoplasm of unspecified site of unspecified female breast: Secondary | ICD-10-CM | POA: Insufficient documentation

## 2013-08-12 DIAGNOSIS — Z09 Encounter for follow-up examination after completed treatment for conditions other than malignant neoplasm: Secondary | ICD-10-CM | POA: Insufficient documentation

## 2013-08-12 DIAGNOSIS — Z5189 Encounter for other specified aftercare: Secondary | ICD-10-CM

## 2013-08-12 DIAGNOSIS — C787 Secondary malignant neoplasm of liver and intrahepatic bile duct: Secondary | ICD-10-CM | POA: Insufficient documentation

## 2013-08-12 NOTE — Patient Instructions (Signed)
We will contact you in 3 months to schedule your next appointment and echocardiogram  

## 2013-08-12 NOTE — Progress Notes (Signed)
Patient ID: [name], female   DOB: January 31, 1966, 47 y.o.   MRN: [id number]  Referring Physician: Dr. Welton Flakes  Primary Care: no PCP  HPI:  [name] is a 47 y.o. female with metastatic invasive ductal carcinoma with liver metastasis. Original diagnosis was in 2008 and a clinical stage I. At that time she underwent neoadjuvant chemotherapy consisting of Taxotere carboplatinum Herceptin followed by a lumpectomy sentinel lymph node biopsy. She also received radiation and then subsequently finished out 1 year of adjuvant Herceptin. Her tumor was ER negative so she did not receive antiestrogen therapy. 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 Tumor was ER negative PR negative HER-2 positive. She initially received Abraxane and Herceptin combination. Once she completed Abraxane she went on to be maintained on Herceptin every 3 weeks indefinitely. She also has a history of tobacco abuse, 1/2 to 1 ppd over the last 30 years. She denies history of stroke, DM or CAD  Echos: 08/23/10: EF 60-65% Lat s' 11.89  09/05/11: EF 55-60% Lat s' 9.65  08/20/12: EF 55-60% Lat s' 10  01/09/13: EF 50-55% Lat s' 9.9    MUGA 01/14/13: No WMA. LVEF 49% 05/01/13 EF 55-60% Lateral s' 10.6 08/13/13 EF 55-60% lateral s' 10.2 strain -17.2%  Follow up: Continues with herceptin q 3 weeks. No problems. No SOB/orthopnea/PND/edema. SBP typically runs 90-100. Fatigued but not dizzy.   ROS: All systems negative except as listed in HPI, PMH and Problem List.  Past Medical History  Diagnosis Date  . Cancer   . Breast cancer 08-02-07    LEFT BREAST INVASIVE MAMMARY CARCINOMA  . 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 Lymphedema of arm     left    Current Outpatient Prescriptions  Medication Sig Dispense Refill  . BIOTIN PO Take 1 tablet by mouth daily as needed (For nail and hair growth.).      Marland Kitchen carvedilol (COREG) 6.25 MG tablet Take 1 tablet (6.25 mg total) by  mouth 2 (two) times daily with a meal.  60 tablet  3  . Cholecalciferol 1000 UNITS tablet Take 1,000 Units by mouth daily.       Marland Kitchen gabapentin (NEURONTIN) 300 MG capsule Take 300 mg by mouth at bedtime.      Marland Kitchen HYDROcodone-acetaminophen (NORCO/VICODIN) 5-325 MG per tablet Take 1 tablet by mouth every 6 (six) hours as needed for pain.  30 tablet  0  . lidocaine-prilocaine (EMLA) cream Apply 1 application topically daily as needed (Applies to port-a-cath.).       Marland Kitchen LORazepam (ATIVAN) 0.5 MG tablet Take 1 tablet (0.5 mg total) by mouth every 8 (eight) hours as needed for anxiety (or sleep).  60 tablet  0  . losartan (COZAAR) 25 MG tablet Take 25 mg by mouth at bedtime.      . mirtazapine (REMERON) 15 MG tablet Take 15 mg by mouth at bedtime.      Marland Kitchen PRESCRIPTION MEDICATION She receives her Herceptin treatments every 3 weeks at the [facility] with Dr. Park Breed. Her last dose of Herceptin was on 08/01/13.       No current facility-administered medications for this encounter.      Filed Vitals:   08/12/13 1054  BP: 86/66  Pulse: 74  Height: 5' (1.524 m)  Weight: 92 lb 6.4 oz (41.912 kg)  SpO2: 99%    PHYSICAL EXAM: General:  Thin Well appearing. No resp difficulty;  HEENT:  normal Neck: supple. JVP flat. Carotids 2+ bilaterally; no bruits. No lymphadenopathy or thryomegaly appreciated. Cor: PMI normal. Regular rate & rhythm. No rubs, gallops or murmurs. Lungs: clear Abdomen: soft, nontender, nondistended. No hepatosplenomegaly. No bruits or masses. Good bowel sounds. Extremities: no cyanosis, clubbing, rash, edema Neuro: alert & orientedx3, cranial nerves grossly intact. Moves all 4 extremities w/o difficulty. Affect pleasant.  ASSESSMENT & PLAN:  1) Breast Cancer, metastatic 2) Chemo-induce cardiomyopathy (Herceptin) -resolved     --I reviewed echos personally. EF and Doppler parameters stable. No HF on exam. Continue Herceptin.      --Continue coreg and losartan. If SBP runs  consistently below 90 or she is more fatigued will cut carvedilol to 3.125 bid  F/u 3 months.   Daniel Bensimhon,MD 11:22 AM

## 2013-08-12 NOTE — Addendum Note (Signed)
Encounter addended by: Noralee Space, RN on: 08/12/2013 11:32 AM<BR>     Documentation filed: Patient Instructions Section

## 2013-08-12 NOTE — Progress Notes (Signed)
  Echocardiogram 2D Echocardiogram has been performed.  Trigg Delarocha, [facility] 08/12/2013, 10:54 AM

## 2013-08-13 ENCOUNTER — Telehealth: Payer: Self-pay | Admitting: Adult Health

## 2013-08-13 ENCOUNTER — Telehealth: Payer: Self-pay

## 2013-08-13 NOTE — Telephone Encounter (Signed)
Please reassure her that she will not get addicted.  Also let her know that we will try to adjust her meds with her next visit

## 2013-08-13 NOTE — Telephone Encounter (Signed)
Pt called c/o back pain radiating to rib cage and pelvis.  Pt currently taking hydrocodone 5/325 twice a day.  It is prescribed every six hours.  Recommended patient take pain medicine more frequently as directed but pt is afraid to take it that often because she has a small child and is afraid of getting addicted.  Recommended said she could take 1/2.  Patient hesitate. Stated again she is afraid of getting addicted.  Next follow up is 10/31 with NP. Message given to MD to review.

## 2013-08-13 NOTE — Telephone Encounter (Signed)
, °

## 2013-08-15 ENCOUNTER — Telehealth: Payer: Self-pay | Admitting: *Deleted

## 2013-08-15 NOTE — Telephone Encounter (Signed)
Per MD, notified pt she will not get addicted to try and take pain medication more often than twice a day pt may take q 6hrs. If this is too strong/makes drowsy; recommended pt take 1/2 a pill. Provider will try to adjust her meds at next visit.pt verbalized understanding. No further concerns

## 2013-08-19 ENCOUNTER — Other Ambulatory Visit: Payer: Self-pay | Admitting: *Deleted

## 2013-08-19 DIAGNOSIS — C50919 Malignant neoplasm of unspecified site of unspecified female breast: Secondary | ICD-10-CM

## 2013-08-19 MED ORDER — MIRTAZAPINE 15 MG PO TABS: 15.0000 mg | ORAL_TABLET | Freq: Every day | ORAL | Status: AC

## 2013-08-19 MED ORDER — GABAPENTIN 300 MG PO CAPS: 300.0000 mg | ORAL_CAPSULE | Freq: Every day | ORAL | Status: DC

## 2013-08-22 ENCOUNTER — Ambulatory Visit (HOSPITAL_BASED_OUTPATIENT_CLINIC_OR_DEPARTMENT_OTHER): Payer: PRIVATE HEALTH INSURANCE | Admitting: Adult Health

## 2013-08-22 ENCOUNTER — Other Ambulatory Visit (HOSPITAL_BASED_OUTPATIENT_CLINIC_OR_DEPARTMENT_OTHER): Payer: PRIVATE HEALTH INSURANCE | Admitting: Lab

## 2013-08-22 ENCOUNTER — Encounter: Payer: Self-pay | Admitting: Adult Health

## 2013-08-22 ENCOUNTER — Other Ambulatory Visit: Payer: PRIVATE HEALTH INSURANCE | Admitting: Lab

## 2013-08-22 ENCOUNTER — Telehealth: Payer: Self-pay | Admitting: *Deleted

## 2013-08-22 ENCOUNTER — Encounter (INDEPENDENT_AMBULATORY_CARE_PROVIDER_SITE_OTHER): Payer: Self-pay

## 2013-08-22 ENCOUNTER — Ambulatory Visit: Payer: PRIVATE HEALTH INSURANCE | Admitting: Adult Health

## 2013-08-22 ENCOUNTER — Other Ambulatory Visit: Payer: Self-pay | Admitting: Adult Health

## 2013-08-22 ENCOUNTER — Ambulatory Visit (HOSPITAL_BASED_OUTPATIENT_CLINIC_OR_DEPARTMENT_OTHER): Payer: PRIVATE HEALTH INSURANCE

## 2013-08-22 ENCOUNTER — Ambulatory Visit (HOSPITAL_COMMUNITY)
Admission: RE | Admit: 2013-08-22 | Discharge: 2013-08-22 | Disposition: A | Discharge status: Home / Self Care | Payer: PRIVATE HEALTH INSURANCE | Source: Ambulatory Visit | Attending: Adult Health | Admitting: Adult Health

## 2013-08-22 VITALS — BP 108/78 | HR 116 | Temp 97.5°F | Resp 18 | Ht 60.0 in | Wt 89.4 lb

## 2013-08-22 DIAGNOSIS — C50919 Malignant neoplasm of unspecified site of unspecified female breast: Secondary | ICD-10-CM

## 2013-08-22 DIAGNOSIS — C787 Secondary malignant neoplasm of liver and intrahepatic bile duct: Secondary | ICD-10-CM

## 2013-08-22 DIAGNOSIS — Z9221 Personal history of antineoplastic chemotherapy: Secondary | ICD-10-CM | POA: Insufficient documentation

## 2013-08-22 DIAGNOSIS — M549 Dorsalgia, unspecified: Secondary | ICD-10-CM

## 2013-08-22 DIAGNOSIS — C773 Secondary and unspecified malignant neoplasm of axilla and upper limb lymph nodes: Secondary | ICD-10-CM

## 2013-08-22 DIAGNOSIS — R079 Chest pain, unspecified: Secondary | ICD-10-CM | POA: Insufficient documentation

## 2013-08-22 DIAGNOSIS — R609 Edema, unspecified: Secondary | ICD-10-CM | POA: Insufficient documentation

## 2013-08-22 DIAGNOSIS — Z5112 Encounter for antineoplastic immunotherapy: Secondary | ICD-10-CM

## 2013-08-22 LAB — COMPREHENSIVE METABOLIC PANEL (CC13)
AST: 129 U/L — ABNORMAL HIGH (ref 5–34)
Albumin: 3.2 g/dL — ABNORMAL LOW (ref 3.5–5.0)
Alkaline Phosphatase: 1175 U/L — ABNORMAL HIGH (ref 40–150)
Anion Gap: 10 mEq/L (ref 3–11)
BUN: 19.2 mg/dL (ref 7.0–26.0)
CO2: 25 mEq/L (ref 22–29)
Glucose: 178 mg/dl — ABNORMAL HIGH (ref 70–140)
Potassium: 4.3 mEq/L (ref 3.5–5.1)
Sodium: 138 mEq/L (ref 136–145)
Total Bilirubin: 1.35 mg/dL — ABNORMAL HIGH (ref 0.20–1.20)
Total Protein: 8.1 g/dL (ref 6.4–8.3)

## 2013-08-22 LAB — CBC WITH DIFFERENTIAL/PLATELET
Basophils Absolute: 0.1 10*3/uL (ref 0.0–0.1)
Eosinophils Absolute: 0 10*3/uL (ref 0.0–0.5)
HGB: 13.3 g/dL (ref 11.6–15.9)
LYMPH%: 15 % (ref 14.0–49.7)
MCV: 98.2 fL (ref 79.5–101.0)
MONO#: 0.4 10*3/uL (ref 0.1–0.9)
MONO%: 5.4 % (ref 0.0–14.0)
NEUT#: 6 10*3/uL (ref 1.5–6.5)
Platelets: 253 10*3/uL (ref 145–400)
WBC: 7.7 10*3/uL (ref 3.9–10.3)

## 2013-08-22 LAB — LIPASE: Lipase: <10 U/L (ref 0–75)

## 2013-08-22 LAB — AMYLASE: Amylase: 45 U/L (ref 0–105)

## 2013-08-22 MED ORDER — IOHEXOL 300 MG/ML  SOLN
100.0000 mL | Freq: Once | INTRAMUSCULAR | Status: AC | PRN
  Administered 2013-08-22: 80 mL via INTRAVENOUS

## 2013-08-22 MED ORDER — SODIUM CHLORIDE 0.9 % IJ SOLN
10.0000 mL | INTRAMUSCULAR | Status: DC | PRN
  Administered 2013-08-22: 10 mL
  Filled 2013-08-22: qty 10

## 2013-08-22 MED ORDER — SODIUM CHLORIDE 0.9 % IV SOLN
Freq: Once | INTRAVENOUS | Status: AC
  Administered 2013-08-22: 14:00:00 via INTRAVENOUS

## 2013-08-22 MED ORDER — TRASTUZUMAB CHEMO INJECTION 440 MG
6.0000 mg/kg | Freq: Once | INTRAVENOUS | Status: AC
  Administered 2013-08-22: 231 mg via INTRAVENOUS
  Filled 2013-08-22: qty 11

## 2013-08-22 MED ORDER — OXYCODONE-ACETAMINOPHEN 5-325 MG PO TABS: 1.0000 | ORAL_TABLET | ORAL | Status: DC | PRN

## 2013-08-22 MED ORDER — HEPARIN SOD (PORK) LOCK FLUSH 100 UNIT/ML IV SOLN
500.0000 [IU] | Freq: Once | INTRAVENOUS | Status: AC | PRN
  Administered 2013-08-22: 500 [IU]
  Filled 2013-08-22: qty 5

## 2013-08-22 NOTE — Progress Notes (Addendum)
OFFICE PROGRESS NOTE  CC Dr. Chevis Pretty Dr. 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  PRIOR THERAPY:  #1 patient originally presented in October 2008 with a palpable left breast mass. She underwent a mammogram and ultrasound on 07/25/2007. The mammogram showed at the 12:00 position 7 cm from the nipple a 1.2 x 0.9 x 0.5 cm hypoechoic mass indenting the underlying pectoralis muscle. The left axilla showed no palpable lymph nodes. She underwent a biopsy on 08/02/2007 the pathology revealed invasive ductal carcinoma intermediate to high-grade ER negative PR negative HER-2/neu positive with a proliferation marker Ki-67 of 40%. MRI of both breasts were performed on 08/12/2007 that showed a solitary enhancing mass in the upper portion of the left breast corresponding to the area of known malignancy measuring 1.3 x 1.0 x 1.0 cm.  #2 patient was subsequently treated with neoadjuvant chemotherapy consisting of Taxotere carboplatinum and Herceptin. She received a total of 6 cycles of this. Overall tolerated it well.  #3 she then went on to have a left lumpectomy with sentinel lymph node biopsy followed by radiation therapy and Herceptin. She received Herceptin weekly to complete out 1 year.  #4 in 2011 patient developed a left breast mass concerning for recurrent disease she had PET scan performed that revealed liver lesions as well as subpectoral area in the left breast that was positive. She had a biopsy performed that showed recurrence of her breast cancer. The tumor was invasive ductal carcinoma with lymphovascular invasion it was ER negative PR negative with a proliferation marker Ki-67 of 86% and HER-2/neu positive.  #5 patient then received Abraxane and Herceptin combination. Once she completed her Abraxane she went on to be on maintenance Herceptin every 3 weeks.  #6 her last staging scans were performed  in October 2013 that revealed no evidence of metastatic disease. Patient is now maintained on Herceptin every 3 weeks  #7 Patient had echocardiogram and appointment with Dr. Gala Romney who determined she had cardiomyopathy and required holding Herceptin and she was placed on Carvedilol and Losartan.  She saw Dr. Gala Romney on 05/01/13 after a couple of months of management and was determined to have fully recovered her LV function and was cleared to restart Herceptin.  We had prescribed Lapatinib in the interim, however she declined therapy as she did not want to pay the co pay or look into assistance programs for this.      CURRENT THERAPY: Herceptin every 3 weeks.    INTERVAL HISTORY: [name] 47 y.o. female returns for evaluation today for her metastatic breast cancer.  She is having pain in her back and her rib cage that is worsening.  It is intermittent and unrelieved with vicodin.  She denies any associated symptoms or pattern to this pain.  At this point she will need scans.  She denies fevers, chills, numbness, tingling, weakness, bowel/bladder incontinence, chest pain, palpitations, DOE, orthopnea or any other concerns.    MEDICAL HISTORY: Past Medical History  Diagnosis Date  . Cancer   . Breast cancer 08-02-07    LEFT BREAST INVASIVE MAMMARY CARCINOMA  . 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 Lymphedema of arm     left    ALLERGIES:  is allergic to effexor xr and megace.  MEDICATIONS:  Current Outpatient Prescriptions  Medication Sig Dispense Refill  . BIOTIN PO Take 1 tablet by mouth daily  as needed (For nail and hair growth.).      Marland Kitchen carvedilol (COREG) 6.25 MG tablet Take 1 tablet (6.25 mg total) by mouth 2 (two) times daily with a meal.  60 tablet  3  . Cholecalciferol 1000 UNITS tablet Take 1,000 Units by mouth daily.       Marland Kitchen gabapentin (NEURONTIN) 300 MG capsule Take 1 capsule (300 mg total) by mouth at bedtime.  30 capsule   0  . HYDROcodone-acetaminophen (NORCO/VICODIN) 5-325 MG per tablet Take 1 tablet by mouth every 6 (six) hours as needed for pain.  30 tablet  0  . lidocaine-prilocaine (EMLA) cream Apply 1 application topically daily as needed (Applies to port-a-cath.).       Marland Kitchen LORazepam (ATIVAN) 0.5 MG tablet Take 1 tablet (0.5 mg total) by mouth every 8 (eight) hours as needed for anxiety (or sleep).  60 tablet  0  . losartan (COZAAR) 25 MG tablet Take 25 mg by mouth at bedtime.      . mirtazapine (REMERON) 15 MG tablet Take 1 tablet (15 mg total) by mouth at bedtime.  30 tablet  0  . PRESCRIPTION MEDICATION She receives her Herceptin treatments every 3 weeks at the [facility] with Dr. Park Breed. Her last dose of Herceptin was on 08/01/13.      Marland Kitchen oxyCODONE-acetaminophen (PERCOCET/ROXICET) 5-325 MG per tablet Take 1 tablet by mouth every 4 (four) hours as needed for pain.  90 tablet  0   No current facility-administered medications for this visit.    SURGICAL HISTORY:  Past Surgical History  Procedure Laterality Date  . Cesarean section      REVIEW OF SYSTEMS:  A 10 point review of systems was conducted and is otherwise negative except for what is noted above.     PHYSICAL EXAMINATION: Blood pressure 108/78, pulse 116, temperature 97.5 F (36.4 C), temperature source Oral, resp. rate 18, height 5' (1.524 m), weight 89 lb 6.4 oz (40.552 kg). Body mass index is 17.46 kg/(m^2). General: Patient is a well appearing female in no acute distress HEENT: PERRLA, sclerae anicteric no conjunctival pallor, no evidence of thrush or mucositis Neck: supple, no palpable adenopathy Lungs: clear to auscultation bilaterally, no wheezes, rhonchi, or rales Cardiovascular: regular rate rhythm, S1, S2, no murmurs, rubs or gallops Abdomen: Soft, mild tenderness in epigastric area, non-distended, normoactive bowel sounds, no HSM Extremities: warm and well perfused, no clubbing, cyanosis, or edema Back: No areas of  point tenderness along the vertebral bodies of the cervical thoracic or lumbar spine Skin: No rashes or lesions Neuro: Non-focal Breasts: exam deferred ECOG PERFORMANCE STATUS: 1 - Symptomatic but completely ambulatory   LABORATORY DATA: Lab Results  Component Value Date   WBC 7.7 08/22/2013   HGB 13.3 08/22/2013   HCT 39.6 08/22/2013   MCV 98.2 08/22/2013   PLT 253 08/22/2013      Chemistry      Component Value Date/Time   NA 138 08/22/2013 1133   NA 138 08/02/2012 1352   K 4.3 08/22/2013 1133   K 3.7 08/02/2012 1352   CL 103 03/21/2013 1300   CL 100 08/02/2012 1352   CO2 25 08/22/2013 1133   CO2 31 08/02/2012 1352   BUN 19.2 08/22/2013 1133   BUN 11 08/02/2012 1352   CREATININE 0.9 08/22/2013 1133   CREATININE 0.66 08/02/2012 1352      Component Value Date/Time   CALCIUM 10.0 08/22/2013 1133   CALCIUM 9.2 08/02/2012 1352   ALKPHOS 1,175*  08/22/2013 1133   ALKPHOS 81 08/02/2012 1352   AST 129* 08/22/2013 1133   AST 15 08/02/2012 1352   ALT 131* 08/22/2013 1133   ALT 9 08/02/2012 1352   BILITOT 1.35* 08/22/2013 1133   BILITOT 0.2* 08/02/2012 1352       RADIOGRAPHIC STUDIES:  No results found.  ASSESSMENT: 47 year old female with  #1 metastatic invasive ductal carcinoma with liver metastasis. Patient's original diagnosis was in 2008 and a clinical stage I. At that time she underwent neoadjuvant chemotherapy consisting of Taxotere carboplatinum Herceptin followed by a lumpectomy sentinel lymph node biopsy. She also received radiation and then subsequently finished out 1 year of adjuvant Herceptin. Her tumor was ER negative so she did not receive antiestrogen therapy.  #2 patient had recurrent disease in 2011 with liver metastasis. Again tumor was ER negative PR negative HER-2 positive. She initially received Abraxane and Herceptin combination. Once she completed Abraxane she went on to be maintained on Herceptin every 3 weeks.  During a routine echo she was found  to have a low normal EF.  She was evaluated with a MUGA scan and by Dr. Gala Romney.  She iscurrently taking Losartan and Carvedilol. She is doing well with this management and repeat testing has been normal.    PLAN:   #1 Patient will proceed with Herceptin today.  She has had her port replaced and it is much improved.    #2 Due to the back and rib pain she will proceed with stat CT chest/abdomen/pelvis.  I prescribed percocet for the pain today.    #3 We will see her back in 3 weeks or sooner if needed.    All questions were answered. The patient knows to call the clinic with any problems, questions or concerns. We can certainly see the patient much sooner if necessary.  I spent 25 minutes counseling the patient face to face. The total time spent in the appointment was 30 minutes.  Amber Level, NP Medical Oncology [facility] [phone]  08/23/2013, 2:46 PM   ATTENDING'S ATTESTATION:  I personally reviewed patient's chart, examined patient myself, formulated the treatment plan as followed.    Patient will proceed with Herceptin today. I am very concerned however about her back and rib pain. I have recommended proceeding with a CT of the chest abdomen and pelvis to make sure that she does not have progressive disease. She will also be given a medications for symptomatic pain relief. Certainly if patient starts having any weakness in the lower extremities or changes in bowel bladder habits she is to call us immediately and the emergency room.  Drue Second, MD Medical/Oncology [facility] [phone] (beeper) [phone] (Office)  08/28/2013, 7:10 PM

## 2013-08-22 NOTE — Patient Instructions (Signed)
[facility] Discharge Instructions for Patients Receiving Chemotherapy  Today you received the following chemotherapy agents Herceptin.  To help prevent nausea and vomiting after your treatment, we encourage you to take your nausea medication as prescribed.   If you develop nausea and vomiting that is not controlled by your nausea medication, call the clinic.   BELOW ARE SYMPTOMS THAT SHOULD BE REPORTED IMMEDIATELY:  *FEVER GREATER THAN 100.5 F  *CHILLS WITH OR WITHOUT FEVER  NAUSEA AND VOMITING THAT IS NOT CONTROLLED WITH YOUR NAUSEA MEDICATION  *UNUSUAL SHORTNESS OF BREATH  *UNUSUAL BRUISING OR BLEEDING  TENDERNESS IN MOUTH AND THROAT WITH OR WITHOUT PRESENCE OF ULCERS  *URINARY PROBLEMS  *BOWEL PROBLEMS  UNUSUAL RASH Items with * indicate a potential emergency and should be followed up as soon as possible.  Feel free to call the clinic you have any questions or concerns. The clinic phone number is [phone].    

## 2013-08-22 NOTE — Telephone Encounter (Signed)
Per staff message and POF I have scheduled appts.  JMW  

## 2013-08-22 NOTE — Patient Instructions (Signed)
Take Miralax daily.  Please call us if you have any questions or concerns.

## 2013-08-25 ENCOUNTER — Other Ambulatory Visit: Payer: Self-pay | Admitting: Adult Health

## 2013-08-25 DIAGNOSIS — C50919 Malignant neoplasm of unspecified site of unspecified female breast: Secondary | ICD-10-CM

## 2013-08-25 MED ORDER — OXYCODONE HCL 5 MG PO TABS: 5.0000 mg | ORAL_TABLET | ORAL | Status: DC | PRN

## 2013-08-25 NOTE — Progress Notes (Signed)
Called patient and informed her of her CT scan results.  We will order a stat MRI abdomen with and w/o contrast, I also recommended she not take the Percocet any longer due to her acute transaminitis.  I prescribed Oxycodone #90 and recommended she pick it up today as she should avoid tylenol containing products.  She will come and pick up her prescription today.    Amber Level, NP Medical Oncology [facility] [phone]

## 2013-08-27 ENCOUNTER — Telehealth: Payer: Self-pay | Admitting: Emergency Medicine

## 2013-08-27 NOTE — Telephone Encounter (Signed)
Spoke with patient; she states the Oxycodone 5mg  doesn't seem to be helping the pain as well as the Percocet is. Instructed patient that she could take 2 tablets and see if that helps. Patient denies any nausea or vomiting and states she is eating and drinking well. Advised patient to call for any questions or concerns. Patient scheduled a MRI on 11/6; advised patient that someone from this office will call her with results 24-48 hours after MRI. Patient verbalized understanding.

## 2013-08-28 ENCOUNTER — Ambulatory Visit
Admission: RE | Admit: 2013-08-28 | Discharge: 2013-08-28 | Disposition: A | Discharge status: Home / Self Care | Payer: PRIVATE HEALTH INSURANCE | Source: Ambulatory Visit | Attending: Adult Health | Admitting: Adult Health

## 2013-08-28 DIAGNOSIS — C50919 Malignant neoplasm of unspecified site of unspecified female breast: Secondary | ICD-10-CM

## 2013-08-28 MED ORDER — GADOXETATE DISODIUM 0.25 MMOL/ML IV SOLN
4.0000 mL | Freq: Once | INTRAVENOUS | Status: AC | PRN
  Administered 2013-08-28: 4 mL via INTRAVENOUS

## 2013-08-29 ENCOUNTER — Other Ambulatory Visit: Payer: Self-pay | Admitting: Emergency Medicine

## 2013-08-29 DIAGNOSIS — C50919 Malignant neoplasm of unspecified site of unspecified female breast: Secondary | ICD-10-CM

## 2013-08-29 MED ORDER — HYDROMORPHONE HCL 2 MG PO TABS: 2.0000 mg | ORAL_TABLET | ORAL | Status: DC | PRN

## 2013-09-02 ENCOUNTER — Telehealth: Payer: Self-pay | Admitting: *Deleted

## 2013-09-02 ENCOUNTER — Encounter: Payer: Self-pay | Admitting: *Deleted

## 2013-09-02 ENCOUNTER — Telehealth: Payer: Self-pay | Admitting: Oncology

## 2013-09-02 NOTE — Telephone Encounter (Signed)
Please let patient know that she needs to keep her appointment with GI tomorrow to figure out why she is in pain.Marland Kitchen

## 2013-09-02 NOTE — Telephone Encounter (Signed)
Pt called states " I went back to the other pain medicine from before because the one she gave me didn't work. Pain 7-8 on 1-10 scale today. Pt advised she does not want anymore pain medication, she just wants something done about the pain in her ribs, back, stomach. Pt questioned if pain is coming from her gallbladder/liver if there's something that can be done to get rid out it. Pt seemed frustrated from her pain however again stated she did not want anymore pain medicine. Message forwarded to provider for review. POF for Echo and GI consult sent on 11/7. No appt for either at this time. Will check with scheduling for more information

## 2013-09-03 ENCOUNTER — Encounter: Payer: Self-pay | Admitting: Gastroenterology

## 2013-09-03 ENCOUNTER — Other Ambulatory Visit: Payer: PRIVATE HEALTH INSURANCE

## 2013-09-03 ENCOUNTER — Telehealth: Payer: Self-pay | Admitting: Gastroenterology

## 2013-09-03 ENCOUNTER — Ambulatory Visit (INDEPENDENT_AMBULATORY_CARE_PROVIDER_SITE_OTHER): Payer: PRIVATE HEALTH INSURANCE | Admitting: Gastroenterology

## 2013-09-03 ENCOUNTER — Ambulatory Visit (INDEPENDENT_AMBULATORY_CARE_PROVIDER_SITE_OTHER): Payer: PRIVATE HEALTH INSURANCE

## 2013-09-03 VITALS — BP 100/70 | HR 70 | Ht 60.0 in | Wt 86.6 lb

## 2013-09-03 DIAGNOSIS — R7989 Other specified abnormal findings of blood chemistry: Secondary | ICD-10-CM

## 2013-09-03 DIAGNOSIS — R1013 Epigastric pain: Secondary | ICD-10-CM

## 2013-09-03 LAB — COMPREHENSIVE METABOLIC PANEL
ALT: 100 U/L — ABNORMAL HIGH (ref 0–35)
AST: 126 U/L — ABNORMAL HIGH (ref 0–37)
Albumin: 3.1 g/dL — ABNORMAL LOW (ref 3.5–5.2)
Alkaline Phosphatase: 1040 U/L — ABNORMAL HIGH (ref 39–117)
BUN: 13 mg/dL (ref 6–23)
CO2: 25 mEq/L (ref 19–32)
Calcium: 9.1 mg/dL (ref 8.4–10.5)
Chloride: 94 mEq/L — ABNORMAL LOW (ref 96–112)
Creatinine, Ser: 0.6 mg/dL (ref 0.4–1.2)

## 2013-09-03 LAB — CBC WITH DIFFERENTIAL/PLATELET
Basophils Absolute: 0 10*3/uL (ref 0.0–0.1)
Basophils Relative: 0.4 % (ref 0.0–3.0)
HCT: 35.2 % — ABNORMAL LOW (ref 36.0–46.0)
Hemoglobin: 12.2 g/dL (ref 12.0–15.0)
Lymphocytes Relative: 11.2 % — ABNORMAL LOW (ref 12.0–46.0)
Lymphs Abs: 0.8 10*3/uL (ref 0.7–4.0)
MCHC: 34.5 g/dL (ref 30.0–36.0)
Monocytes Relative: 5.7 % (ref 3.0–12.0)
Neutro Abs: 5.8 10*3/uL (ref 1.4–7.7)
Platelets: 278 10*3/uL (ref 150.0–400.0)
RDW: 13.1 % (ref 11.5–14.6)

## 2013-09-03 LAB — HEPATITIS A ANTIBODY, TOTAL: Hep A Total Ab: REACTIVE — AB

## 2013-09-03 LAB — HEPATITIS B CORE ANTIBODY, TOTAL: Hep B Core Total Ab: NONREACTIVE

## 2013-09-03 LAB — PROTIME-INR: INR: 1.2 ratio — ABNORMAL HIGH (ref 0.8–1.0)

## 2013-09-03 LAB — HEPATITIS C ANTIBODY: HCV Ab: NEGATIVE

## 2013-09-03 LAB — HEPATITIS B SURFACE ANTIBODY,QUALITATIVE: Hep B S Ab: NEGATIVE

## 2013-09-03 NOTE — Telephone Encounter (Signed)
I spoke with Dr. Rhea Belton about the patient's labs that returned this evening. Her total bilirubin is 12.2. After discussing with Dr. Rhea Belton I tried to contact the patient at her telephone number listed on 2 separate occasions, however, there was no answer. I then attempted to call her emergency contact who is her husband at the number listed, however, it was a wrong number.  I was trying to reach the patient to discuss her lab results and further discuss her medications and to advise her to discontinue her Remeron for now if she is currently taking that. Also wanted to advise her of our plan regarding evaluation going further pending the results of her HIDA scan tomorrow. I will try to contact her again in the morning.

## 2013-09-03 NOTE — Progress Notes (Addendum)
09/03/2013 [name] [id number] September 07, 1966   HISTORY OF PRESENT ILLNESS:  Patient is a pleasant 47 year old female who has been referred to our office by her oncologist, Dr. Park Breed. Patient has a history of breast cancer in 2008 with recurrence in 2011. She's been on Herceptin since 2011 for treatment of that. About one month ago she was found to have newly elevated LFTs with a total bilirubin of 0.57, alkaline phosphatase 345, AST 53, ALT 55.  Repeat LFTs on October 31 revealed a total bilirubin of 1.35, alkaline phosphatase 1175, AST 129, ALT 131. CBC was normal along with BMP. She underwent a CT scan of the abdomen and pelvis with contrast, which showed.  Periportal edema, subscapular hypodensity and gallbladder wall edema of uncertain etiology. This was suggested to be related to liver dysfunction or volume overload but could not exclude hepatic metastasis and it was recommended correlation with liver function tests and consider hepatic MRI with and without contrast. She underwent MRI of the abdomen with and without contrast, which showed hepatic periportal and gallbladder wall edema which is nonspecific and no etiology was apparent by MRI. There is no evidence of hepatic metastasis or other intra-abdominal metastatic disease. The patient states that around the same time that the elevation was noted she began having severe epigastric abdominal pain and pain in the middle of her back. The pain has reached an intensity of 10 out of 10 on the pain scale, but states that the pain medication she has been taking does help to relieve it somewhat. She states she noticed that her eyes were turning yellow about 2 or 3 days ago. Her appetite has not been great and she feels very fatigued, but otherwise does not feel ill.  Her LFT's were completely normal just two months ago.  She denies any new medications including OTC supplement, herbs, etc.    Past Medical History  Diagnosis Date  . Breast cancer 08-02-07     LEFT BREAST INVASIVE MAMMARY CARCINOMA  . 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 Lymphedema of arm     left   Past Surgical History  Procedure Laterality Date  . Cesarean section  2008    reports that she has been smoking Cigarettes.  She has a 9 pack-year smoking history. She has never used smokeless tobacco. She reports that she does not drink alcohol or use illicit drugs. family history includes Gallbladder disease in an other family member; Heart disease in her father; Hypertension in her brother; Lung cancer in her mother. There is no history of Colon cancer. Allergies  Allergen Reactions  . Effexor Xr [Venlafaxine Hcl Er] Diarrhea  . Megace [Megestrol] Rash      Outpatient Encounter Prescriptions as of 09/03/2013  Medication Sig  . BIOTIN PO Take 1 tablet by mouth daily as needed (For nail and hair growth.).  Marland Kitchen carvedilol (COREG) 6.25 MG tablet Take 1 tablet (6.25 mg total) by mouth 2 (two) times daily with a meal.  . Cholecalciferol 1000 UNITS tablet Take 1,000 Units by mouth daily.   Marland Kitchen lidocaine-prilocaine (EMLA) cream Apply 1 application topically daily as needed (Applies to port-a-cath.).   Marland Kitchen LORazepam (ATIVAN) 0.5 MG tablet Take 1 tablet (0.5 mg total) by mouth every 8 (eight) hours as needed for anxiety (or sleep).  . losartan (COZAAR) 25 MG tablet Take 25 mg by mouth at bedtime.  . mirtazapine (REMERON) 15 MG tablet Take 1 tablet (15 mg total)  by mouth at bedtime.  Marland Kitchen oxyCODONE (OXY IR/ROXICODONE) 5 MG immediate release tablet Take 1 tablet (5 mg total) by mouth every 4 (four) hours as needed for pain.  Marland Kitchen PRESCRIPTION MEDICATION She receives her Herceptin treatments every 3 weeks at the [facility] with Dr. Park Breed. Her last dose of Herceptin was on 08/01/13.  . [DISCONTINUED] oxyCODONE-acetaminophen (PERCOCET/ROXICET) 5-325 MG per tablet Take 1 tablet by mouth every 4 (four) hours as needed for pain.  .  [DISCONTINUED] gabapentin (NEURONTIN) 300 MG capsule Take 1 capsule (300 mg total) by mouth at bedtime.  . [DISCONTINUED] HYDROcodone-acetaminophen (NORCO/VICODIN) 5-325 MG per tablet Take 1 tablet by mouth every 6 (six) hours as needed for pain.  . [DISCONTINUED] HYDROmorphone (DILAUDID) 2 MG tablet Take 1 tablet (2 mg total) by mouth every 4 (four) hours as needed for severe pain.     REVIEW OF SYSTEMS  : All other systems reviewed and negative except where noted in the History of Present Illness.   PHYSICAL EXAM: BP 100/70  Pulse 70  Ht 5' (1.524 m)  Wt 86 lb 9.6 oz (39.282 kg)  BMI 16.91 kg/m2 General:  Thin white female in no acute distress; she is jaundice. Head: Normocephalic and atraumatic Eyes:  Scleral icterus is present. Ears: Normal auditory acuity Lungs: Clear throughout to auscultation Heart: Regular rate and rhythm Abdomen: Soft, non-distended.  BS present.  Epigastric TTP without R/R/G. Musculoskeletal: Symmetrical with no gross deformities  Skin: No lesions on visible extremities Extremities: No edema  Neurological: Alert oriented x 4, grossly non-focal Psychological:  Alert and cooperative. Normal mood and affect  ASSESSMENT AND PLAN: -Newly elevated LFTs with the greatest elevation in the alkaline phosphatase. This has been associated with epigastric abdominal pain and back pain. Also has abnormal imaging studies with of possible hepatic versus gallbladder pathology. We will order a STAT HIDA scan to rule out acalculous cholecystitis. We will check labs including CBC, repeat CMP, PT/INR. We'll also check some liver serologies including ANA, AMA,  ASMA, IgG, acute viral hepatitis panel, hepatitis B core antibody total as well as hepatitis B surface antibody (checking for reactivation of Hepatitis B while immunosupressed).  Pending the results of the HIDA scan she may need MRCP, however, there was no evidence of biliary ductal dilation on other imaging  studies.  Addendum: Reviewed and agree with initial management. If LFTs continue to increase, I would recommend we follow them closely (hep function panel and INR weekly) until etiology clear or enzymes improve Beverley Fiedler, MD

## 2013-09-03 NOTE — Patient Instructions (Addendum)
Please go to the basement level to have your labs drawn.   We have scheduled the Hidascan test, ( shows the function of the gallbladder) for tomorrow 09-04-2013.  Location is [facility], [facility]. Entrance A ( the new [employer]).  They do have valet parking. Arrive at 10:45 AM.  Have nothing to eat or drink for 6 hours prior to the test.  Have nothing by mouth  past 5:00 AM.  _____________________________________________________________________ hepatobiliary (HIDA) scan is an imaging procedure used to diagnose problems in the liver, gallbladder and bile ducts. In the HIDA scan, a radioactive chemical or tracer is injected into a vein in your arm. The tracer is handled by the liver like bile. Bile is a fluid produced and excreted by your liver that helps your digestive system break down fats in the foods you eat. Bile is stored in your gallbladder and the gallbladder releases the bile when you eat a meal. A special nuclear medicine scanner (gamma camera) tracks the flow of the tracer from your liver into your gallbladder and small intestine.  During your HIDA scan  You'll be asked to change into a hospital gown before your HIDA scan begins. Your health care team will position you on a table, usually on your back. The radioactive tracer is then injected into a vein in your arm.The tracer travels through your bloodstream to your liver, where it's taken up by the bile-producing cells. The radioactive tracer travels with the bile from your liver into your gallbladder and through your bile ducts to your small intestine.You may feel some pressure while the radioactive tracer is injected into your vein. As you lie on the table, a special gamma camera is positioned over your abdomen taking pictures of the tracer as it moves through your body. The gamma camera takes pictures continually for about an hour. You'll need to keep still during the HIDA scan. This can become uncomfortable, but you may find  that you can lessen the discomfort by taking deep breaths and thinking about other things. Tell your health care team if you're uncomfortable. The radiologist will watch on a computer the progress of the radioactive tracer through your body. The HIDA scan may be stopped when the radioactive tracer is seen in the gallbladder and enters your small intestine. This typically takes about an hour. In some cases extra imaging will be performed if original images aren't satisfactory, if morphine is given to help visualize the gallbladder or if the medication CCK is given to look at the contraction of the gallbladder. This test typically takes 2 hours to complete. ________________________________________________________________________

## 2013-09-04 ENCOUNTER — Ambulatory Visit (HOSPITAL_COMMUNITY)
Admission: RE | Admit: 2013-09-04 | Discharge: 2013-09-04 | Disposition: A | Discharge status: Home / Self Care | Payer: PRIVATE HEALTH INSURANCE | Source: Ambulatory Visit | Attending: Gastroenterology | Admitting: Gastroenterology

## 2013-09-04 ENCOUNTER — Telehealth: Payer: Self-pay | Admitting: Gastroenterology

## 2013-09-04 ENCOUNTER — Telehealth: Payer: Self-pay | Admitting: Internal Medicine

## 2013-09-04 DIAGNOSIS — R110 Nausea: Secondary | ICD-10-CM | POA: Insufficient documentation

## 2013-09-04 DIAGNOSIS — R799 Abnormal finding of blood chemistry, unspecified: Secondary | ICD-10-CM | POA: Insufficient documentation

## 2013-09-04 DIAGNOSIS — Z853 Personal history of malignant neoplasm of breast: Secondary | ICD-10-CM | POA: Insufficient documentation

## 2013-09-04 DIAGNOSIS — R1013 Epigastric pain: Secondary | ICD-10-CM

## 2013-09-04 DIAGNOSIS — K759 Inflammatory liver disease, unspecified: Secondary | ICD-10-CM | POA: Insufficient documentation

## 2013-09-04 DIAGNOSIS — R109 Unspecified abdominal pain: Secondary | ICD-10-CM | POA: Insufficient documentation

## 2013-09-04 LAB — ANTI-NUCLEAR AB-TITER (ANA TITER): ANA Titer 1: 1:40 {titer} — ABNORMAL HIGH

## 2013-09-04 MED ORDER — ONDANSETRON 4 MG PO TBDP: 4.0000 mg | ORAL_TABLET | Freq: Four times a day (QID) | ORAL | Status: AC | PRN

## 2013-09-04 MED ORDER — TECHNETIUM TC 99M MEBROFENIN IV KIT
5.0000 | PACK | Freq: Once | INTRAVENOUS | Status: AC | PRN
  Administered 2013-09-04: 5 via INTRAVENOUS

## 2013-09-04 NOTE — Addendum Note (Signed)
Addended by: Doug Sou D on: 09/04/2013 05:12 PM   Modules accepted: Orders

## 2013-09-04 NOTE — Telephone Encounter (Signed)
I spoke with the patient this morning around 8:40 AM.  I told her that I was trying to reach her last evening to give her the results of her repeat liver function tests and advised her that her bilirubin had increased to 12.2. I told her that I spoke with Dr. Rhea Belton, and we will be contacting her with further plans after we receive the results of her HIDA scan later today. I asked her about the Remeron that she's taking, and she reports that she has been taking that for quite some time and it is not a new medication. She states that she has not been taking that medication or any of her other medications at all this week until she figured out what was going on with her labs etc.  She states that the number we have listed for her husband has changed and she will give [place] the new number at later time.

## 2013-09-04 NOTE — Telephone Encounter (Signed)
We were able to get the patient an appointment with [facility] tomorrow afternoon at 2 PM. They contacted the patient and gave her all the information. She said that her husband will be taking her to that visit tomorrow.  Strongly impressed upon the need for her to go to that appointment tomorrow afternoon.  All of her records from here have been faxed to that office.  Her HIDA scan indicates hepatic dysfunction/failure. According to labs last night her synthetic function is still intact, but INR slightly elevated from her baseline at 1.2. Platelets were normal.

## 2013-09-05 ENCOUNTER — Telehealth: Payer: Self-pay

## 2013-09-05 ENCOUNTER — Telehealth: Payer: Self-pay | Admitting: Oncology

## 2013-09-05 DIAGNOSIS — C50912 Malignant neoplasm of unspecified site of left female breast: Secondary | ICD-10-CM

## 2013-09-05 DIAGNOSIS — R1011 Right upper quadrant pain: Secondary | ICD-10-CM

## 2013-09-05 LAB — MITOCHONDRIAL/SMOOTH MUSCLE AB PNL
Mitochondrial M2 Ab, IgG: 0.41 (ref <0.91)
Smooth Muscle Ab: 10 U (ref <20)

## 2013-09-05 NOTE — Telephone Encounter (Signed)
Patient evaluated today at [facility] in [place] for elevated LFTs and right upper quad abdominal pain.  Dr. Rhea Belton spoke with attending MD he is recommending a PET scan here in [facility] for r/o recurrent  breast cancer infiltration of the liver.  Per Dr. Rhea Belton set up PET scan dx code breast cancer, abdominal pain, and RUQ pain. Patient is scheduled for 09/17/13 1:45 arrival at [facility].  She is to be NPO x 6 hours prior.  Patient aware of the recommendations and appt date and time.  She will call back for any additional questions or concerns

## 2013-09-05 NOTE — Telephone Encounter (Signed)
, °

## 2013-09-08 NOTE — Telephone Encounter (Signed)
Spoke with pt to inform her she needs labs weekly for 2 weeks prior to her PET Scan and she will come tomorrow. She asked if any other labs were abnormal and wants to know what they mean. Pt is requesting Doug Sou, PA explain it to her. Informed her Shanda Bumps is at the hospital this week and may not have time, but I will see.

## 2013-09-08 NOTE — Addendum Note (Signed)
Addended by: Florene Glen on: 09/08/2013 10:46 AM   Modules accepted: Orders

## 2013-09-08 NOTE — Telephone Encounter (Signed)
Spoke with hepatologist in [place], [place] with [facility] who saw pt on Friday He felt the most likely explanation for the patients pain and rise in LFTs (cholestatic pattern) was an infiltrative process such as metastatic breast cancer He recommended PET CT scan and if positive targeted liver biopsy. If PET is negative, then proceed with random liver biopsy His suspicion for drug-induced liver injury was low given that none of her medications are new and may have been at stable doses We will monitor hepatic function panel, INR and CBC weekly until her PET scan

## 2013-09-08 NOTE — Telephone Encounter (Signed)
error 

## 2013-09-08 NOTE — Telephone Encounter (Signed)
[name],  Let patient know that a couple of her labs were abnormal, the ANA and the IgA.  Both of these are tests to help determine if this could be what we call autoimmune hepatitis (and I spoke with her about autoimmune hepatitis the other day).  The hepatologist in [place] had those results to review, but they are concerned that this issue with her LFT's may be related to metastatic breast cancer instead.  Either way she is likely going to have a liver biopsy to determine the cause and that will tell us what is causing the issue (ie, metastatic breast cancer, autoimmune hepatitis, etc).  Hopefully this helps.  Thank you,  Jess

## 2013-09-08 NOTE — Telephone Encounter (Signed)
Jess, can you help interpret the lab results? Thanks.

## 2013-09-08 NOTE — Telephone Encounter (Signed)
The patient has been notified of this information and all questions answered. She will call back for further questions/problems.

## 2013-09-09 ENCOUNTER — Ambulatory Visit (HOSPITAL_COMMUNITY)
Admission: RE | Admit: 2013-09-09 | Discharge: 2013-09-09 | Disposition: A | Discharge status: Home / Self Care | Payer: PRIVATE HEALTH INSURANCE | Source: Ambulatory Visit | Attending: Oncology | Admitting: Oncology

## 2013-09-09 ENCOUNTER — Telehealth: Payer: Self-pay | Admitting: *Deleted

## 2013-09-09 ENCOUNTER — Other Ambulatory Visit (INDEPENDENT_AMBULATORY_CARE_PROVIDER_SITE_OTHER): Payer: PRIVATE HEALTH INSURANCE

## 2013-09-09 DIAGNOSIS — R7989 Other specified abnormal findings of blood chemistry: Secondary | ICD-10-CM

## 2013-09-09 DIAGNOSIS — C50912 Malignant neoplasm of unspecified site of left female breast: Secondary | ICD-10-CM

## 2013-09-09 DIAGNOSIS — R1011 Right upper quadrant pain: Secondary | ICD-10-CM

## 2013-09-09 DIAGNOSIS — C50919 Malignant neoplasm of unspecified site of unspecified female breast: Secondary | ICD-10-CM

## 2013-09-09 DIAGNOSIS — Z09 Encounter for follow-up examination after completed treatment for conditions other than malignant neoplasm: Secondary | ICD-10-CM

## 2013-09-09 DIAGNOSIS — R109 Unspecified abdominal pain: Secondary | ICD-10-CM | POA: Insufficient documentation

## 2013-09-09 DIAGNOSIS — I080 Rheumatic disorders of both mitral and aortic valves: Secondary | ICD-10-CM | POA: Insufficient documentation

## 2013-09-09 DIAGNOSIS — F17200 Nicotine dependence, unspecified, uncomplicated: Secondary | ICD-10-CM | POA: Insufficient documentation

## 2013-09-09 LAB — HEPATIC FUNCTION PANEL
ALT: 96 U/L — ABNORMAL HIGH (ref 0–35)
Albumin: 2.6 g/dL — ABNORMAL LOW (ref 3.5–5.2)
Alkaline Phosphatase: 830 U/L — ABNORMAL HIGH (ref 39–117)
Total Protein: 7.4 g/dL (ref 6.0–8.3)

## 2013-09-09 LAB — CBC WITH DIFFERENTIAL/PLATELET
Eosinophils Absolute: 0 10*3/uL (ref 0.0–0.7)
Eosinophils Relative: 0.4 % (ref 0.0–5.0)
HCT: 35.1 % — ABNORMAL LOW (ref 36.0–46.0)
Lymphs Abs: 2 10*3/uL (ref 0.7–4.0)
MCHC: 34 g/dL (ref 30.0–36.0)
MCV: 96.3 fl (ref 78.0–100.0)
Monocytes Absolute: 0.3 10*3/uL (ref 0.1–1.0)
Neutro Abs: 6.9 10*3/uL (ref 1.4–7.7)
Platelets: 313 10*3/uL (ref 150.0–400.0)
RDW: 15 % — ABNORMAL HIGH (ref 11.5–14.6)
WBC: 9.6 10*3/uL (ref 4.5–10.5)

## 2013-09-09 LAB — PROTIME-INR
INR: 1.4 ratio — ABNORMAL HIGH (ref 0.8–1.0)
Prothrombin Time: 14.5 s — ABNORMAL HIGH (ref 10.2–12.4)

## 2013-09-09 NOTE — Progress Notes (Signed)
*  PRELIMINARY RESULTS* Echocardiogram 2D Echocardiogram has been performed.  Jeryl Columbia 09/09/2013, 11:52 AM

## 2013-09-10 ENCOUNTER — Telehealth: Payer: Self-pay | Admitting: *Deleted

## 2013-09-10 ENCOUNTER — Ambulatory Visit (INDEPENDENT_AMBULATORY_CARE_PROVIDER_SITE_OTHER): Payer: PRIVATE HEALTH INSURANCE | Admitting: Physician Assistant

## 2013-09-10 ENCOUNTER — Other Ambulatory Visit: Payer: Self-pay | Admitting: *Deleted

## 2013-09-10 ENCOUNTER — Encounter: Payer: Self-pay | Admitting: Physician Assistant

## 2013-09-10 VITALS — BP 102/56 | HR 88 | Ht 61.0 in | Wt 84.0 lb

## 2013-09-10 DIAGNOSIS — R945 Abnormal results of liver function studies: Secondary | ICD-10-CM

## 2013-09-10 DIAGNOSIS — R7989 Other specified abnormal findings of blood chemistry: Secondary | ICD-10-CM

## 2013-09-10 DIAGNOSIS — C50919 Malignant neoplasm of unspecified site of unspecified female breast: Secondary | ICD-10-CM

## 2013-09-10 NOTE — Progress Notes (Addendum)
Subjective:    Patient ID: [name], female    DOB: 06-Sep-1966, 47 y.o.   MRN: [id number]  HPI  Chestine is a pleasant 47 year old female seen for the first time in our office last week when she was referred by her oncologist Dr. Welton Flakes for evaluation of jaundice and elevated LFTs. Patient has history of an invasive ductal carcinoma of the left breast which was diagnosed in 2008. She underwent female and then had a left lumpectomy. In 2011 she had a recurrence in the left breast and PET scan revealed liver metastases as well as a sub-pectoral node. Tumor was felt to be invasive ductal carcinoma with lymphovascular invasion. She was treated again with chemotherapy and Herceptin. She was re\re states in October of 2013 with no evidence of metastatic disease and has been maintained on Herceptin sense About 1 month ago she had labs done with finding of elevated LFTs but no jaundice. Labs were repeated on October 31 showed a total bilirubin of 1.3 alkaline phosphatase of 1175 AST of 129 and ALT of 131. She's underwent subsequent CT scan of the abdomen and pelvis which showed periportal edema a subcapsular hypodensity in the right hepatic lobe and edema of the gallbladder wall but no definite metastases. She had subsequent MRI on 08/28/2013 which showed hepatic periportal and gallbladder wall edema which was felt to be nonspecific and no definite evidence for metastatic disease. Now over the past 2 weeks patient has developed somewhat progressively severe epigastric pain which has been constant and radiating into her back. This is been associated with nausea and lack of appetite. She has not had any fever or chills. Patient was scheduled for a HIDA scan which was done on 1113 and showed hepatic uptake but no excretion which was felt consistent with hepatic dysfunction. Repeat labs were done in her bilirubin was up to 12. Hepatitis serologies were done and were negative. IgG is positive at 1830 and ANA is  positive. She was urgently referred to [facility] hepatology clinic. It was there impression that there was high concern for infiltrative hepatic metastases and that PET/CT would be indicated initially and then probably targeted liver biopsy depending on the results. Patient had labs done yesterday showing a total bilirubin of 24.1 alkaline phosphatase of 8:30 AST of 161 and ALT of 96. Pro time is 14.5 and INR is 1.4 which is slightly elevated. Platelets are 313 PET scan has been ordered but not done as yet .     Review of Systems  Constitutional: Positive for appetite change.  HENT: Negative.   Eyes: Negative.   Respiratory: Negative.   Cardiovascular: Negative.   Gastrointestinal: Positive for nausea and abdominal pain.  Endocrine: Negative.   Genitourinary: Negative.   Musculoskeletal: Positive for back pain.  Skin: Positive for color change.  Allergic/Immunologic: Negative.   Neurological: Negative.   Hematological: Negative.   Psychiatric/Behavioral: Negative.    Outpatient Prescriptions Prior to Visit  Medication Sig Dispense Refill  . BIOTIN PO Take 1 tablet by mouth daily as needed (For nail and hair growth.).      Marland Kitchen carvedilol (COREG) 6.25 MG tablet Take 1 tablet (6.25 mg total) by mouth 2 (two) times daily with a meal.  60 tablet  3  . Cholecalciferol 1000 UNITS tablet Take 1,000 Units by mouth daily.       Marland Kitchen lidocaine-prilocaine (EMLA) cream Apply 1 application topically daily as needed (Applies to port-a-cath.).       Marland Kitchen LORazepam (ATIVAN) 0.5  MG tablet Take 1 tablet (0.5 mg total) by mouth every 8 (eight) hours as needed for anxiety (or sleep).  60 tablet  0  . losartan (COZAAR) 25 MG tablet Take 25 mg by mouth at bedtime.      . mirtazapine (REMERON) 15 MG tablet Take 1 tablet (15 mg total) by mouth at bedtime.  30 tablet  0  . ondansetron (ZOFRAN ODT) 4 MG disintegrating tablet Take 1 tablet (4 mg total) by mouth every 6 (six) hours as needed for nausea or  vomiting.  20 tablet  0  . oxyCODONE (OXY IR/ROXICODONE) 5 MG immediate release tablet Take 1 tablet (5 mg total) by mouth every 4 (four) hours as needed for pain.  90 tablet  0  . PRESCRIPTION MEDICATION She receives her Herceptin treatments every 3 weeks at the [facility] with Dr. Park Breed. Her last dose of Herceptin was on 08/01/13.       No facility-administered medications prior to visit.   Allergies  Allergen Reactions  . Effexor Xr [Venlafaxine Hcl Er] Diarrhea  . Megace [Megestrol] Rash   Past family and social history are reviewed in the Epic chart     Objective:   Physical Exam chronically ill-appearing very thin frail white female deeply jaundiced. Blood pressure 102/56 pulse 88 height 5 foot 1, wt 84 pounds HEENT; sclera are deeply icteric conjunctiva are pink oropharynx clear, Supple; no JVD, Cardiovascular; regular rate and rhythm with S1-S2 no murmur or gallop, Pulmonary; clear bilaterally, Abdomen ;she is tender across the upper abdomen and particularly in the epigastrium liver is palpable 3 fingerbreadths below the right costal margin no definite palpable mass or hepatosplenomegaly bowel sounds are present, Rectal ;exam not done, Extremities; jaundiced, Psych; mood and affect appropriate        Assessment & Plan:  #1  48 yo female with history of invasive ductal carcinoma of the left breast initially diagnosed in 2008 then with recurrence in 2011 and documented hepatic metastases. She been treated with chemotherapy and Herceptin and then restaging in October of 2013 had no evidence of metastatic disease. Patient now presenting with 10-14 day history of epigastric pain which has been constant radiating into her back and associated with some nausea. She has also had a marked transaminitis and progressive elevation of her bilirubin. Thus far workup has been unrevealing as to the etiology and no definite hepatic metastases though this is felt to be of primary concern.  Patient has also had gallbladder wall edema and no felt less likely will need to rule out a calculus cholecystitis complicating the above. Patient now has a slightly prolonged INR concerning for impending hepatic failure.  Plan; long discussion with the patient today including discussion per Dr. Rhea Belton. Patient is aware that she has a severe liver situation which may be life-threatening and that we are concerned about an infiltrative metastatic process. We have been able to urgently reschedule a PET scan for tomorrow morning at [facility]. Patient will return here after that for repeat labs including pro time/INR. Depending on results of PET scan and labs patient may require hospitalization to pursue liver biopsy and MRCP for further diagnostic purposes and to expedite the process given the severity of her illness.  Addendum: Reviewed and agree with management. I met the patient in person the day and discussed my concern for acutely worsening liver enzymes and further hepatic decompensation. We're working hard to achieve a diagnosis and she is scheduled for PET/CT scan tomorrow in  [place] If this shows high suspicion for malignancy in the liver, targeted biopsy will be recommended.  Otherwise she will need random liver biopsy. Follow liver enzymes extremely closely along with mental status and INR Overall prognosis is poor which we've tried to explain to the patient today Beverley Fiedler, MD

## 2013-09-10 NOTE — Progress Notes (Signed)
Cancelled PET scan here, pt will go to [facility] per Mike Gip, PA

## 2013-09-10 NOTE — Telephone Encounter (Signed)
Joy is calling about info to justify the PET/CT. lmom for pt to call back. Faxed the last note to her.

## 2013-09-10 NOTE — Patient Instructions (Signed)
GO to [facility] [address] [place] Go to admitting. Arrive at 7:45 am. Directions provided.  After the PET scan, Please come to our lab, [address] [place], [place], [place]. The orders are in for the lab.

## 2013-09-10 NOTE — Telephone Encounter (Signed)
Called PET this am and pt is on the waiting list; second on the list. The tech stated make sure the Pre Cert is done; sometimes they have cancellations because of insurance, but he will call the pt. Notified pt to keep her appt today with Mike Gip, PA and it's OK for her to go ahead and eat, drink and take her pain meds.

## 2013-09-11 ENCOUNTER — Encounter (HOSPITAL_COMMUNITY): Payer: Self-pay | Admitting: *Deleted

## 2013-09-11 ENCOUNTER — Ambulatory Visit: Payer: PRIVATE HEALTH INSURANCE

## 2013-09-11 ENCOUNTER — Ambulatory Visit: Payer: Self-pay | Admitting: Internal Medicine

## 2013-09-11 ENCOUNTER — Telehealth: Payer: Self-pay | Admitting: *Deleted

## 2013-09-11 ENCOUNTER — Inpatient Hospital Stay (HOSPITAL_COMMUNITY)
Admission: AD | Admit: 2013-09-11 | Discharge: 2013-09-16 | DRG: 435 | Disposition: A | Discharge status: Home / Self Care | Payer: PRIVATE HEALTH INSURANCE | Source: Ambulatory Visit | Attending: Internal Medicine | Admitting: Internal Medicine

## 2013-09-11 DIAGNOSIS — Z8249 Family history of ischemic heart disease and other diseases of the circulatory system: Secondary | ICD-10-CM

## 2013-09-11 DIAGNOSIS — Z923 Personal history of irradiation: Secondary | ICD-10-CM

## 2013-09-11 DIAGNOSIS — T451X5A Adverse effect of antineoplastic and immunosuppressive drugs, initial encounter: Secondary | ICD-10-CM | POA: Diagnosis present

## 2013-09-11 DIAGNOSIS — R945 Abnormal results of liver function studies: Secondary | ICD-10-CM

## 2013-09-11 DIAGNOSIS — C50919 Malignant neoplasm of unspecified site of unspecified female breast: Secondary | ICD-10-CM | POA: Diagnosis present

## 2013-09-11 DIAGNOSIS — K7200 Acute and subacute hepatic failure without coma: Secondary | ICD-10-CM | POA: Diagnosis present

## 2013-09-11 DIAGNOSIS — E43 Unspecified severe protein-calorie malnutrition: Secondary | ICD-10-CM | POA: Diagnosis present

## 2013-09-11 DIAGNOSIS — I5022 Chronic systolic (congestive) heart failure: Secondary | ICD-10-CM | POA: Diagnosis present

## 2013-09-11 DIAGNOSIS — L299 Pruritus, unspecified: Secondary | ICD-10-CM | POA: Diagnosis present

## 2013-09-11 DIAGNOSIS — R1013 Epigastric pain: Secondary | ICD-10-CM

## 2013-09-11 DIAGNOSIS — Z681 Body mass index (BMI) 19 or less, adult: Secondary | ICD-10-CM

## 2013-09-11 DIAGNOSIS — E876 Hypokalemia: Secondary | ICD-10-CM | POA: Diagnosis present

## 2013-09-11 DIAGNOSIS — F17200 Nicotine dependence, unspecified, uncomplicated: Secondary | ICD-10-CM | POA: Diagnosis present

## 2013-09-11 DIAGNOSIS — I428 Other cardiomyopathies: Secondary | ICD-10-CM | POA: Diagnosis present

## 2013-09-11 DIAGNOSIS — I427 Cardiomyopathy due to drug and external agent: Secondary | ICD-10-CM

## 2013-09-11 DIAGNOSIS — Z9010 Acquired absence of unspecified breast and nipple: Secondary | ICD-10-CM

## 2013-09-11 DIAGNOSIS — E236 Other disorders of pituitary gland: Secondary | ICD-10-CM | POA: Diagnosis present

## 2013-09-11 DIAGNOSIS — C787 Secondary malignant neoplasm of liver and intrahepatic bile duct: Principal | ICD-10-CM | POA: Diagnosis present

## 2013-09-11 DIAGNOSIS — C773 Secondary and unspecified malignant neoplasm of axilla and upper limb lymph nodes: Secondary | ICD-10-CM | POA: Diagnosis present

## 2013-09-11 DIAGNOSIS — R7989 Other specified abnormal findings of blood chemistry: Secondary | ICD-10-CM

## 2013-09-11 DIAGNOSIS — Z9221 Personal history of antineoplastic chemotherapy: Secondary | ICD-10-CM

## 2013-09-11 DIAGNOSIS — I509 Heart failure, unspecified: Secondary | ICD-10-CM | POA: Diagnosis present

## 2013-09-11 DIAGNOSIS — D638 Anemia in other chronic diseases classified elsewhere: Secondary | ICD-10-CM | POA: Diagnosis present

## 2013-09-11 DIAGNOSIS — D649 Anemia, unspecified: Secondary | ICD-10-CM | POA: Diagnosis present

## 2013-09-11 DIAGNOSIS — Z801 Family history of malignant neoplasm of trachea, bronchus and lung: Secondary | ICD-10-CM

## 2013-09-11 DIAGNOSIS — E871 Hypo-osmolality and hyponatremia: Secondary | ICD-10-CM | POA: Diagnosis present

## 2013-09-11 DIAGNOSIS — Z79899 Other long term (current) drug therapy: Secondary | ICD-10-CM

## 2013-09-11 LAB — COMPREHENSIVE METABOLIC PANEL
ALT: 89 U/L — ABNORMAL HIGH (ref 0–35)
AST: 152 U/L — ABNORMAL HIGH (ref 0–37)
Alkaline Phosphatase: 741 U/L — ABNORMAL HIGH (ref 39–117)
BUN: 9 mg/dL (ref 6–23)
Calcium: 8.7 mg/dL (ref 8.4–10.5)
Chloride: 93 mEq/L — ABNORMAL LOW (ref 96–112)
Creatinine, Ser: 0.2 mg/dL — ABNORMAL LOW (ref 0.4–1.2)
GFR: 325.7 mL/min (ref >60.00)
Potassium: 3.6 mEq/L (ref 3.5–5.1)
Total Bilirubin: 25.8 mg/dL — ABNORMAL HIGH (ref 0.3–1.2)

## 2013-09-11 MED ORDER — LIDOCAINE-PRILOCAINE 2.5-2.5 % EX CREA
TOPICAL_CREAM | Freq: Once | CUTANEOUS | Status: DC
  Filled 2013-09-11: qty 5

## 2013-09-11 MED ORDER — ONDANSETRON HCL 4 MG PO TABS: 4.0000 mg | ORAL_TABLET | Freq: Four times a day (QID) | ORAL | Status: DC | PRN

## 2013-09-11 MED ORDER — ONDANSETRON HCL 4 MG/2ML IJ SOLN: 4.0000 mg | Freq: Four times a day (QID) | INTRAMUSCULAR | Status: DC | PRN

## 2013-09-11 MED ORDER — MORPHINE SULFATE 2 MG/ML IJ SOLN
1.0000 mg | INTRAMUSCULAR | Status: DC | PRN
  Administered 2013-09-12 – 2013-09-16 (×7): 1 mg via INTRAVENOUS
  Filled 2013-09-11 (×7): qty 1

## 2013-09-11 MED ORDER — SODIUM CHLORIDE 0.9 % IJ SOLN: 3.0000 mL | Freq: Two times a day (BID) | INTRAMUSCULAR | Status: DC

## 2013-09-11 MED ORDER — CARVEDILOL 6.25 MG PO TABS
6.2500 mg | ORAL_TABLET | Freq: Two times a day (BID) | ORAL | Status: DC
  Administered 2013-09-15 – 2013-09-16 (×3): 6.25 mg via ORAL
  Filled 2013-09-11 (×11): qty 1

## 2013-09-11 MED ORDER — SENNOSIDES-DOCUSATE SODIUM 8.6-50 MG PO TABS
1.0000 | ORAL_TABLET | Freq: Every evening | ORAL | Status: DC | PRN
  Administered 2013-09-14: 1 via ORAL
  Filled 2013-09-11: qty 1

## 2013-09-11 MED ORDER — LORAZEPAM 0.5 MG PO TABS: 0.5000 mg | ORAL_TABLET | Freq: Three times a day (TID) | ORAL | Status: DC | PRN

## 2013-09-11 MED ORDER — MIRTAZAPINE 15 MG PO TABS
15.0000 mg | ORAL_TABLET | Freq: Every day | ORAL | Status: DC
  Administered 2013-09-11 – 2013-09-15 (×5): 15 mg via ORAL
  Filled 2013-09-11 (×6): qty 1

## 2013-09-11 MED ORDER — LOSARTAN POTASSIUM 25 MG PO TABS
25.0000 mg | ORAL_TABLET | Freq: Every day | ORAL | Status: DC
  Administered 2013-09-11 – 2013-09-15 (×2): 25 mg via ORAL
  Filled 2013-09-11 (×6): qty 1

## 2013-09-11 MED ORDER — OXYCODONE HCL 5 MG PO TABS
5.0000 mg | ORAL_TABLET | ORAL | Status: DC | PRN
  Administered 2013-09-11 – 2013-09-16 (×21): 5 mg via ORAL
  Filled 2013-09-11 (×20): qty 1

## 2013-09-11 MED ORDER — OXYCODONE HCL 5 MG PO TABS
5.0000 mg | ORAL_TABLET | Freq: Once | ORAL | Status: DC
  Filled 2013-09-11: qty 1

## 2013-09-11 MED ORDER — SODIUM CHLORIDE 0.9 % IJ SOLN: 3.0000 mL | INTRAMUSCULAR | Status: DC | PRN

## 2013-09-11 MED ORDER — SODIUM CHLORIDE 0.9 % IV SOLN: 250.0000 mL | INTRAVENOUS | Status: DC | PRN

## 2013-09-11 NOTE — Telephone Encounter (Signed)
Informed pt she will be admitted to [facility] and Admitting will call her. Pt had questions and I transferred her to Mike Gip, PA.

## 2013-09-11 NOTE — Consult Note (Signed)
Referring Provider: No ref. provider found Primary Care Physician:  No PCP Per Patient Primary Gastroenterologist:  Dr. Rhea Belton  Reason for Consultation:  Liver failure; metastatic disease to the liver  HPI: [name] is a 47 y.o. female seen for the first time in our office last week when she was referred by her oncologist Dr. Welton Flakes for evaluation of jaundice and elevated LFTs. Patient has history of an invasive ductal carcinoma of the left breast which was diagnosed in 2008. She underwent female and then had a left lumpectomy. In 2011 she had a recurrence in the left breast and PET scan revealed liver metastases as well as a sub-pectoral node. Tumor was felt to be invasive ductal carcinoma with lymphovascular invasion. She was treated again with chemotherapy and Herceptin. She was restaged in October of 2013 with no evidence of metastatic disease and has been maintained on Herceptin since that time. About 1 month ago she had labs done with finding of elevated LFTs but no jaundice. Labs were repeated on October 31 showed a total bilirubin of 1.3, alkaline phosphatase of 1175, AST of 129, and ALT of 131.  She's underwent subsequent CT scan of the abdomen and pelvis which showed periportal edema a subcapsular hypodensity in the right hepatic lobe and edema of the gallbladder wall but no definite metastases.  She had subsequent MRI on 08/28/2013 which showed hepatic periportal and gallbladder wall edema which was felt to be nonspecific and no definite evidence for metastatic disease.  Now over the past 2 weeks patient has developed somewhat progressively severe epigastric pain which has been constant and radiating into her back. This is been associated with nausea and lack of appetite. She has not had any fever or chills.  Patient was scheduled for a HIDA scan which was done on 11/13 and showed hepatic uptake but no excretion which was felt consistent with hepatic dysfunction. Repeat labs were done in  her bilirubin was up to 12. Hepatitis serologies were done and were negative. IgG is positive at 1830 and ANA is positive.  She was urgently referred to [facility] hepatology clinic. It was their impression that there was high concern for infiltrative hepatic metastases and that PET/CT would be indicated initially and then probably targeted liver biopsy depending on the results.  Patient had labs done 11/18 showing a total bilirubin of 24.1, alkaline phosphatase of 830, AST of 161, and ALT of 96. Pro time was 14.5 and INR was 1.4, which is slightly elevated. Platelets are 313.  PET CT scan was performed today at [facility] and shows probably diffuse metastatic process in the liver.  Report is on her shadow chart.    Past Medical History  Diagnosis Date  . Breast cancer 08-02-07    LEFT BREAST INVASIVE MAMMARY CARCINOMA  . 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 Lymphedema of arm     left    Past Surgical History  Procedure Laterality Date  . Cesarean section  2008    Prior to Admission medications   Medication Sig Start Date End Date Taking? Authorizing Provider  BIOTIN PO Take 1 tablet by mouth daily as needed (For nail and hair growth.).    Historical Provider, MD  carvedilol (COREG) 6.25 MG tablet Take 1 tablet (6.25 mg total) by mouth 2 (two) times daily with a meal. 03/19/13   Aundria Rud, NP  Cholecalciferol 1000 UNITS tablet Take 1,000 Units by mouth daily.  Historical Provider, MD  lidocaine-prilocaine (EMLA) cream Apply 1 application topically daily as needed (Applies to port-a-cath.).  06/17/10   Historical Provider, MD  LORazepam (ATIVAN) 0.5 MG tablet Take 1 tablet (0.5 mg total) by mouth every 8 (eight) hours as needed for anxiety (or sleep). 07/09/13   Victorino December, MD  losartan (COZAAR) 25 MG tablet Take 25 mg by mouth at bedtime.    Historical Provider, MD  mirtazapine (REMERON) 15 MG tablet Take 1 tablet (15 mg  total) by mouth at bedtime. 08/19/13   Victorino December, MD  ondansetron (ZOFRAN ODT) 4 MG disintegrating tablet Take 1 tablet (4 mg total) by mouth every 6 (six) hours as needed for nausea or vomiting. 09/04/13   Princella Pellegrini. Zehr, PA-C  oxyCODONE (OXY IR/ROXICODONE) 5 MG immediate release tablet Take 1 tablet (5 mg total) by mouth every 4 (four) hours as needed for pain. 08/25/13   Illa Level, NP  PRESCRIPTION MEDICATION She receives her Herceptin treatments every 3 weeks at the [facility] with Dr. Park Breed. Her last dose of Herceptin was on 08/01/13.    Historical Provider, MD    No current facility-administered medications for this encounter.    Allergies as of 09/11/2013 - Review Complete 09/10/2013  Allergen Reaction Noted  . Effexor xr [venlafaxine hcl er] Diarrhea 03/29/2012  . Megace [megestrol] Rash 02/11/2013    Family History  Problem Relation Age of Onset  . Lung cancer Mother   . Heart disease Father   . Hypertension Brother   . Colon cancer Neg Hx   . Gallbladder disease      mat side    History   Social History  . Marital Status: Married    Spouse Name: N/A    Number of Children: 2  . Years of Education: N/A   Occupational History  . Not on file.   Social History Main Topics  . Smoking status: Current Every Day Smoker -- 0.30 packs/day for 30 years    Types: Cigarettes  . Smokeless tobacco: Never Used     Comment: tobacco info given 09/03/13  . Alcohol Use: No  . Drug Use: No  . Sexual Activity: Yes   Other Topics Concern  . Not on file   Social History Narrative  . No narrative on file    Review of Systems: Ten point ROS is O/W negative except as mentioned in HPI.  Physical Exam: Vital signs in last 24 hours: Temp:  [98.1 F (36.7 C)] 98.1 F (36.7 C) (11/20 1544) Pulse Rate:  [91] 91 (11/20 1544) Resp:  [20] 20 (11/20 1544) BP: (104)/(66) 104/66 mmHg (11/20 1544) SpO2:  [100 %] 100 % (11/20 1544)   General:   Alert,  thin, pleasant and cooperative in NAD.  Jaundice present. Head:  Normocephalic and atraumatic. Eyes:  Scleral icterus present. Ears:  Normal auditory acuity. Mouth:  No deformity or lesions.   Lungs:  Clear throughout to auscultation.  No wheezes, crackles, or rhonchi.  Heart:  Regular rate and rhythm; no murmurs, clicks, rubs,  or gallops. Abdomen:  Soft, thin, non-distended.  BS present.  Epigastric TTP. Rectal:  Deferred  Msk:  Symmetrical without gross deformities. Pulses:  Normal pulses noted. Extremities:  Without clubbing or edema. Neurologic:  Alert and  oriented x4;  grossly normal neurologically. Skin:  Intact without significant lesions or rashes.  Jaundice. Psych:  Alert and cooperative. Normal mood and affect.  Lab Results:  Recent Labs  09/09/13  1155  WBC 9.6  HGB 11.9*  HCT 35.1*  PLT 313.0   BMET  Recent Labs  09/11/13 1101  NA 130*  K 3.6  CL 93*  CO2 28  GLUCOSE 92  BUN 9  CREATININE 0.2*  CALCIUM 8.7   LFT  Recent Labs  09/09/13 1155 09/11/13 1101  PROT 7.4 7.1  ALBUMIN 2.6* 2.3*  AST 162* 152*  ALT 96* 89*  ALKPHOS 830* 741*  BILITOT 24.1* 25.8*  BILIDIR 9.0*  --    PT/INR  Recent Labs  09/09/13 1155  LABPROT 14.5*  INR 1.4*   IMPRESSION:  #1 47 yo female with history of invasive ductal carcinoma of the left breast initially diagnosed in 2008 then with recurrence in 2011 and documented hepatic metastases. She been treated with chemotherapy and Herceptin and then restaging in October of 2013 had no evidence of metastatic disease.  Patient now presenting with several week history of epigastric pain which has been constant radiating into her back and associated with some nausea. She also has marked transaminitis and progressive elevation of her bilirubin.  Patient also has a slightly prolonged INR concerning for impending hepatic failure. Primary concern is for an infiltrative metastatic process.  PET CT today also indicating possible  diffuse metastatic disease.    PLAN: -Patient is being admitted to the hospitalist service for close observation and monitoring of her labs (lft's and coags). -Dr. Welton Flakes with oncology is aware and will see the patient as well. -IR has been contacted and order has been placed for targeted liver biopsy, which is tentatively planned for tomorrow.  Please place a RUSH on pathology. -OK for diet tonight then NPO after midnight. -Pain control.  **PET CT report is on patient's shadow chart.   ZEHR, JESSICA D.  09/11/2013, 3:53 PM  Pager number [phone]   ________________________________________________________________________  [facility] MD note:  I personally examined the patient, reviewed the data and agree with the assessment and plan described above.   Rob Bunting, MD [facility] Pager [phone]

## 2013-09-11 NOTE — H&P (Signed)
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 She was referred by her oncologist to the GI physicians one week ago for evaluation of jaundice and elevated LFTs. Over the past 2 weeks she has developed progressively severe epigastric pain which has been constant radiating into her back associated with nausea and anorexia. No fever or chills. It seems that she has been referred to her liver specialist in [place] and a PET scan was arranged for today that showed findings for abnormal hypermetabolism in the central liver with grossly abnormal appearing gallbladder differential diagnosis: Ascending cholangitis, metastatic breast cancer or cholangiocarcinoma. She has had progressively worsening LFTs and jaundice. Most recent labs drawn on November 18 showed a total bilirubin of 24 alkaline phosphatase of 830 AST of 161 and ALT of 96 with an INR of 1.4. She has been accepted as a direct admission for a targeted liver biopsy for further diagnosis of her acute and rapidly progressive liver failure. She has already been seen in consult by gastroenterology who has discussed case with interventional radiology for biopsy.  Allergies:   Allergies  Allergen Reactions  . Effexor Xr [Venlafaxine Hcl Er] Diarrhea  . Megace [Megestrol] Rash      Past Medical History  Diagnosis Date  . Breast cancer 08-02-07    LEFT BREAST INVASIVE MAMMARY CARCINOMA  . 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 Lymphedema of arm     left    Past Surgical History  Procedure Laterality Date  . Cesarean section  2008    Prior to Admission medications   Medication Sig Start Date End Date Taking? Authorizing Provider   BIOTIN PO Take 1 tablet by mouth daily as needed (For nail and hair growth.).   Yes Historical Provider, MD  carvedilol (COREG) 6.25 MG tablet Take 1 tablet (6.25 mg total) by mouth 2 (two) times daily with a meal. 03/19/13  Yes Aundria Rud, NP  Cholecalciferol 1000 UNITS tablet Take 1,000 Units by mouth daily.    Yes Historical Provider, MD  lidocaine-prilocaine (EMLA) cream Apply 1 application topically daily as needed (Applies to port-a-cath.).  06/17/10  Yes Historical Provider, MD  LORazepam (ATIVAN) 0.5 MG tablet Take 1 tablet (0.5 mg total) by mouth every 8 (eight) hours as needed for anxiety (or sleep). 07/09/13  Yes Victorino December, MD  losartan (COZAAR) 25 MG tablet Take 25 mg by mouth at bedtime.   Yes Historical Provider, MD  mirtazapine (REMERON) 15 MG tablet Take 1 tablet (15 mg total) by mouth at bedtime. 08/19/13  Yes Victorino December, MD  oxyCODONE (OXY IR/ROXICODONE) 5 MG immediate release tablet Take 1 tablet (5 mg total) by mouth every 4 (four) hours as needed for pain. 08/25/13  Yes Illa Level, NP  PRESCRIPTION MEDICATION She receives her Herceptin treatments every 3 weeks at [facility] with Dr. Park Breed. Her last dose of Herceptin was on 08/01/13.   Yes Historical Provider, MD  ondansetron (ZOFRAN ODT) 4 MG disintegrating tablet Take 1 tablet (4 mg total) by mouth every 6 (six) hours as needed for nausea or vomiting. 09/04/13   Princella Pellegrini. Zehr, PA-C  Social History:  reports that she has been smoking Cigarettes.  She has a 9 pack-year smoking history. She has never used smokeless tobacco. She reports that she does not drink alcohol or use illicit drugs.  Family History  Problem Relation Age of Onset  . Lung cancer Mother   . Heart disease Father   . Hypertension Brother   . Colon cancer Neg Hx   . Gallbladder disease      mat side    Review of Systems:  Constitutional: Denies fever, chills, diaphoresis. HEENT: Denies photophobia, eye pain,  redness, hearing loss, ear pain, congestion, sore throat, rhinorrhea, sneezing, mouth sores, trouble swallowing, neck pain, neck stiffness and tinnitus.   Respiratory: Denies SOB, DOE, cough, chest tightness,  and wheezing.   Cardiovascular: Denies chest pain, palpitations and leg swelling.  Gastrointestinal: Denies nausea, vomiting,  diarrhea, constipation, blood in stool and abdominal distention.  Genitourinary: Denies dysuria, urgency, frequency, hematuria, flank pain and difficulty urinating.  Endocrine: Denies: hot or cold intolerance, sweats, changes in hair or nails, polyuria, polydipsia. Musculoskeletal: Denies myalgias, back pain, joint swelling, arthralgias and gait problem.  Skin: Denies pallor, rash and wound.  Neurological: Denies dizziness, seizures, syncope, weakness, light-headedness, numbness and headaches.  Hematological: Denies adenopathy. Easy bruising, personal or family bleeding history  Psychiatric/Behavioral: Denies suicidal ideation, mood changes, confusion, nervousness, sleep disturbance and agitation   Physical Exam: Blood pressure 104/66, pulse 91, temperature 98.1 F (36.7 C), temperature source Oral, resp. rate 20, height 5\' 1"  (1.549 m), weight 38.102 kg (84 lb), SpO2 100.00%. General: Alert, awake, oriented x3, complaining of epigastric pain, is jaundiced. HEENT: Normocephalic, atraumatic, pupils equal and reactive to light, extraocular movements intact, moist mucous membranes, scleral icterus present. Neck: Supple, no JVD, no lymphadenopathy, no bruits, no goiter. Cardiovascular: Regular rate and rhythm, no murmurs, rubs or gallops. Lungs: Clear to auscultation bilaterally, Abdomen: Soft, tender to palpation of the right upper quadrant and epigastrium, nondistended, positive bowel sounds. Extremities: No clubbing, cyanosis or edema, positive pedal pulses. Neurologic: Grossly intact and nonfocal.  Labs on Admission:  Results for orders placed in visit on  09/11/13 (from the past 48 hour(s))  COMPREHENSIVE METABOLIC PANEL     Status: Abnormal   Collection Time    09/11/13 11:01 AM      Result Value Range   Sodium 130 (*) 135 - 145 mEq/L   Potassium 3.6  3.5 - 5.1 mEq/L   Chloride 93 (*) 96 - 112 mEq/L   CO2 28  19 - 32 mEq/L   Glucose, Bld 92  70 - 99 mg/dL   BUN 9  6 - 23 mg/dL   Creatinine, Ser 0.2 (*) 0.4 - 1.2 mg/dL   Total Bilirubin 91.4 (*) 0.3 - 1.2 mg/dL   Alkaline Phosphatase 741 (*) 39 - 117 U/L   AST 152 (*) 0 - 37 U/L   ALT 89 (*) 0 - 35 U/L   Total Protein 7.1  6.0 - 8.3 g/dL   Albumin 2.3 (*) 3.5 - 5.2 g/dL   Calcium 8.7  8.4 - 78.2 mg/dL   GFR 956.21  >30.86 mL/min    Radiological Exams on Admission: No results found.  Assessment/Plan Active Problems:   Malignant neoplasm of breast (female), unspecified site   Elevated LFTs   Transaminitis with rapidly progressive acute liver failure -She has been admitted at the request of gastroenterology for a targeted liver biopsy. -She does have a history of breast cancer and it is possible that this may  be related to metastatic disease, however PET scan cannot completely rule out a cholangiocarcinoma or ascending cholangitis. -Ascending cholangitis to me appears unlikely given her lack of fever, leukocytosis. For now we'll not placed on antibiotics. -Will continue home oxycodone 5 mg every 4 hours which she states works for her current pain level. -Interventional radiology has already been contacted by GI.  DVT prophylaxis -SCDs given her elevated INR.  CODE STATUS -Full code   Time Spent on Admission: 70 minutes  HERNANDEZ ACOSTA,ESTELA [facility] Pager: [phone] 09/11/2013, 5:24 PM

## 2013-09-11 NOTE — Progress Notes (Signed)
Pending admission:    Called by gastroenterology. Patient is a 47 year old female with history of recurrent breast cancer who was seen by gastroenterology last week for abnormal labs including transaminases. Concern is that patient is having pending fulminant liver failure secondary to metastatic breast cancer versus autoimmune hepatitis. Patient underwent PET scan today and likely will need liver biopsy. [facility] on board, They have discussed this case with patient's oncologist Dr.Khan. Patient is coming from home as a direct admission. Please note that PET scan was done at [facility] and will not be available in Epic system. Gastroenterology to obtain copy for uploading and review.

## 2013-09-12 ENCOUNTER — Other Ambulatory Visit: Payer: PRIVATE HEALTH INSURANCE | Admitting: Lab

## 2013-09-12 ENCOUNTER — Ambulatory Visit: Payer: PRIVATE HEALTH INSURANCE

## 2013-09-12 ENCOUNTER — Inpatient Hospital Stay (HOSPITAL_COMMUNITY): Payer: PRIVATE HEALTH INSURANCE

## 2013-09-12 ENCOUNTER — Ambulatory Visit: Payer: PRIVATE HEALTH INSURANCE | Admitting: Adult Health

## 2013-09-12 DIAGNOSIS — E871 Hypo-osmolality and hyponatremia: Secondary | ICD-10-CM | POA: Diagnosis present

## 2013-09-12 DIAGNOSIS — D649 Anemia, unspecified: Secondary | ICD-10-CM | POA: Diagnosis present

## 2013-09-12 DIAGNOSIS — I5022 Chronic systolic (congestive) heart failure: Secondary | ICD-10-CM | POA: Diagnosis present

## 2013-09-12 DIAGNOSIS — E876 Hypokalemia: Secondary | ICD-10-CM | POA: Diagnosis present

## 2013-09-12 DIAGNOSIS — E43 Unspecified severe protein-calorie malnutrition: Secondary | ICD-10-CM | POA: Diagnosis present

## 2013-09-12 LAB — COMPREHENSIVE METABOLIC PANEL
ALT: 77 U/L — ABNORMAL HIGH (ref 0–35)
AST: 135 U/L — ABNORMAL HIGH (ref 0–37)
Alkaline Phosphatase: 767 U/L — ABNORMAL HIGH (ref 39–117)
CO2: 25 mEq/L (ref 19–32)
Chloride: 93 mEq/L — ABNORMAL LOW (ref 96–112)
GFR calc Af Amer: >90 mL/min (ref >90)
GFR calc non Af Amer: >90 mL/min (ref >90)
Glucose, Bld: 100 mg/dL — ABNORMAL HIGH (ref 70–99)
Potassium: 3.4 mEq/L — ABNORMAL LOW (ref 3.5–5.1)
Sodium: 129 mEq/L — ABNORMAL LOW (ref 135–145)
Total Bilirubin: 21 mg/dL (ref 0.3–1.2)

## 2013-09-12 LAB — CBC
MCH: 32 pg (ref 26.0–34.0)
MCHC: 34.3 g/dL (ref 30.0–36.0)
Platelets: 281 10*3/uL (ref 150–400)
RDW: 15.8 % — ABNORMAL HIGH (ref 11.5–15.5)

## 2013-09-12 LAB — PROTIME-INR
INR: 1.12 (ref <1.50)
INR: 1.09 (ref 0.00–1.49)
Prothrombin Time: 13.9 seconds (ref 11.6–15.2)

## 2013-09-12 MED ORDER — SODIUM CHLORIDE 0.9 % IV SOLN
INTRAVENOUS | Status: DC
  Administered 2013-09-12 – 2013-09-16 (×7): via INTRAVENOUS

## 2013-09-12 MED ORDER — SODIUM CHLORIDE 0.9 % IV BOLUS (SEPSIS)
500.0000 mL | Freq: Once | INTRAVENOUS | Status: AC
  Administered 2013-09-12: 500 mL via INTRAVENOUS

## 2013-09-12 MED ORDER — FENTANYL CITRATE 0.05 MG/ML IJ SOLN
INTRAMUSCULAR | Status: AC
Start: 2013-09-12 — End: 2013-09-13
  Filled 2013-09-12: qty 6

## 2013-09-12 MED ORDER — MIDAZOLAM HCL 2 MG/2ML IJ SOLN
INTRAMUSCULAR | Status: AC | PRN
  Administered 2013-09-12: 0.5 mg via INTRAVENOUS

## 2013-09-12 MED ORDER — FENTANYL CITRATE 0.05 MG/ML IJ SOLN
INTRAMUSCULAR | Status: AC | PRN
  Administered 2013-09-12: 25 ug via INTRAVENOUS

## 2013-09-12 MED ORDER — MIDAZOLAM HCL 2 MG/2ML IJ SOLN
INTRAMUSCULAR | Status: AC
  Filled 2013-09-12: qty 6

## 2013-09-12 NOTE — Care Management Note (Signed)
   CARE MANAGEMENT NOTE 09/12/2013  Patient:  [name], [name]   Account Number:  [id number]  Date Initiated:  09/12/2013  Documentation initiated by:  Latona Krichbaum  Subjective/Objective Assessment:   47 yo female admitted with elevated liver enzymes and jaundice.     Action/Plan:   Home when stable   Anticipated DC Date:     Anticipated DC Plan:  HOME/SELF CARE      DC Planning Services  CM consult      Choice offered to / List presented to:  NA   DME arranged  NA      DME agency  NA     HH arranged  NA      HH agency  NA   Status of service:  Completed, signed off Medicare Important Message given?   (If response is "NO", the following Medicare IM given date fields will be blank) Date Medicare IM given:   Date Additional Medicare IM given:    Discharge Disposition:    Per UR Regulation:  Reviewed for med. necessity/level of care/duration of stay  If discussed at Long Length of Stay Meetings, dates discussed:    Comments:  09/12/13 1427 Noelly Lasseigne,RN,MSN [phone] Chart reviewed for utilization of services.Cm spoke with patient concerning dc planning. No PCP on record. Pt offered information concerning [facility] and other PCP providers. Pt declined information. Pt resides home with spouse. Per pt no difficulties affording medications.

## 2013-09-12 NOTE — Progress Notes (Signed)
On-call paged regarding am BP, new orders received.

## 2013-09-12 NOTE — Progress Notes (Signed)
[facility] PROGRESS NOTE   SHANESHA BEDNARZ ZOX:[id number] DOB: [birth year] DOA: 09/11/2013 PCP: No PCP Per Patient Oncologist: Dr. Drue Second  Brief narrative: SEMA STANGLER is an 47 y.o. 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  She was referred by her oncologist to gastroenterology approximately one week prior to admission for evaluation of jaundice and elevated LFTs. Had a PET scan done at [facility] 09/11/2013 which showed abnormal hypermetabolism in the central liver and a grossly abnormal appearing gallbladder. She was admitted for a targeted liver biopsy to assist with diagnosis of the etiology of her acute and rapidly progressive liver failure.  Assessment/Plan: Principal Problem:   Elevated LFTs with jaundice secondary to suspected malignancy of liver (from breast cancer metastasis versus new primary) Patient was admitted for elective liver biopsy which will be done today. Continue gentle hydration. Continue to monitor LFTs. PT not elevated. Spoke with Dr. Christella Hartigan who tells me that there are no dilated ducts and therefore a stent is not currently indicated. Active Problems:   Severe protein calorie malnutrition Dietitian consultation requested.   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 Had MUGA scan 01/17/2013 with EF of 49%. Continue Coreg and losartan.   Malignant neoplasm of breast (female), unspecified site Patient was diagnosed in 2008 status post neoadjuvant chemotherapy consisting of Taxotere, carboplatin and and Herceptin followed by a lumpectomy and sentinel lymph node biopsy. She then underwent radiation and subsequently finished out one year of adjuvant Herceptin. 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 She initially received Abraxane and Herceptin. Current therapy is every 3 week Herceptin. Her oncologist is aware of her admission.  Code Status: Full. Family Communication: Husband  updated at bedside. Disposition Plan: Home when stable.  IV access:  Port-A-Cath right chest wall  Medical Consultants:  Oncology  Dr. Rob Bunting, Gastroenterology  Dr. Irish Lack, Interventional radiology  Other Consultants:  None.  Anti-infectives:  None.  HPI/Subjective: Lilymae A Giammarino is having some mid abdominal pain. No current complaints of nausea or active vomiting.  Objective: Filed Vitals:   09/11/13 2129 09/12/13 0510 09/12/13 0540 09/12/13 0627  BP: 105/69 89/54 89/57  95/59  Pulse: 105 82    Temp: 98.9 F (37.2 C) 98.4 F (36.9 C)    TempSrc: Oral Oral    Resp: 18 16    Height:      Weight:      SpO2: 98% 94%      Intake/Output Summary (Last 24 hours) at 09/12/13 0729 Last data filed at 09/12/13 0510  Gross per 24 hour  Intake    360 ml  Output      0 ml  Net    360 ml    Exam: Gen:  NAD, cachectic appearing, jaundiced Cardiovascular:  RRR, No M/R/G Respiratory:  Lungs CTAB Gastrointestinal:  Abdomen soft, NT/ND, + BS Extremities:  No C/E/C  Data Reviewed: asic Metabolic Panel:  Recent Labs Lab 09/11/13 1101 09/12/13 0511  NA 130* 129*  K 3.6 3.4*  CL 93* 93*  CO2 28 25  GLUCOSE 92 100*  BUN 9 8  CREATININE 0.2* 0.45*  CALCIUM 8.7 8.7   GFR Estimated Creatinine Clearance: 51.7 ml/min (by C-G formula based on Cr of 0.45). Liver Function Tests:  Recent Labs Lab 09/09/13 1155 09/11/13 1101 09/12/13 0511  AST 162* 152* 135*  ALT 96* 89* 77*  ALKPHOS 830* 741* 767*  BILITOT 24.1* 25.8* 21.0*  PROT 7.4 7.1 6.1  ALBUMIN 2.6* 2.3* 2.0*   Coagulation profile  Recent Labs Lab 09/09/13 1155 09/11/13 1317 09/12/13 0511  INR 1.4* 1.12 1.09    CBC:  Recent Labs Lab 09/09/13 1155 09/12/13 0511  WBC 9.6 9.0  NEUTROABS 6.9  --   HGB 11.9* 10.7*  HCT 35.1* 31.2*  MCV 96.3 93.4  PLT 313.0 281     Procedures and Diagnostic Studies: Nm Hepato W/eject Fract  09/04/2013   CLINICAL DATA:  Elevated  bilirubin. History of breast cancer and hepatitis. Abdominal pain and nausea.  EXAM: NUCLEAR MEDICINE HEPATOBILIARY IMAGING  TECHNIQUE: Sequential images of the abdomen were obtained out to 60 minutes following intravenous administration of radiopharmaceutical.  COMPARISON:  MRI 08/28/2013.  RADIOPHARMACEUTICALS:  5 mCi Tc-65m Choletec  FINDINGS: There is delayed hepatic uptake of radiotracer with gradual accumulation of radiotracer over 95 min. The common bile duct is never visualized. There is no visualization of the gallbladder and no excretion of contrast into the small bowel, not unexpected given the lack of excretion of radiotracer from the liver. Free radiotracer is evident in the urinary bladder.  IMPRESSION: Hepatic uptake without excretion is compatible with either profound cholestasis or hepatic dysfunction/ failure.   Electronically Signed   By: Andreas Newport M.D.   On: 09/04/2013 13:48    Scheduled Meds: . carvedilol  6.25 mg Oral BID WC  . losartan  25 mg Oral QHS  . mirtazapine  15 mg Oral QHS  . sodium chloride  3 mL Intravenous Q12H   Continuous Infusions: . sodium chloride 75 mL/hr at 09/12/13 8119    Time spent: 35 minutes with > 50% of time discussing current diagnostic test results, clinical impression and plan of care.    LOS: 1 day   RAMA,CHRISTINA  [facility] Pager [phone].   *Please note that the hospitalists switch teams on Wednesdays. Please call the flow manager at [phone] if you are having difficulty reaching the hospitalist taking care of this patient as she can update you and provide the most up-to-date pager number of provider caring for the patient. If 8PM-8AM, please contact night-coverage at [email or web address], password [facility]  09/12/2013, 7:29 AM

## 2013-09-12 NOTE — Progress Notes (Addendum)
[facility] Progress Note    Since last GI note: She is uncomfortable but ready to have biopsy of liver  Objective: Vital signs in last 24 hours: Temp:  [98.1 F (36.7 C)-98.9 F (37.2 C)] 98.4 F (36.9 C) (11/21 0510) Pulse Rate:  [82-105] 82 (11/21 0510) Resp:  [16-20] 16 (11/21 0510) BP: (89-105)/(54-69) 95/59 mmHg (11/21 0627) SpO2:  [94 %-100 %] 94 % (11/21 0510) Weight:  [84 lb (38.102 kg)] 84 lb (38.102 kg) (11/20 1544) Last BM Date: 09/10/13 General: alert and oriented times 3 +scleral icterus and jaundiced skin Heart: regular rate and rythm Abdomen: soft, mildly tender throughout, non-distended, normal bowel sounds   Lab Results:  Recent Labs  09/09/13 1155 09/12/13 0511  WBC 9.6 9.0  HGB 11.9* 10.7*  PLT 313.0 281  MCV 96.3 93.4    Recent Labs  09/11/13 1101 09/12/13 0511  NA 130* 129*  K 3.6 3.4*  CL 93* 93*  CO2 28 25  GLUCOSE 92 100*  BUN 9 8  CREATININE 0.2* 0.45*  CALCIUM 8.7 8.7    Recent Labs  09/09/13 1155 09/11/13 1101 09/12/13 0511  PROT 7.4 7.1 6.1  ALBUMIN 2.6* 2.3* 2.0*  AST 162* 152* 135*  ALT 96* 89* 77*  ALKPHOS 830* 741* 767*  BILITOT 24.1* 25.8* 21.0*  BILIDIR 9.0*  --   --     Recent Labs  09/11/13 1317 09/12/13 0511  INR 1.12 1.09    Medications: Scheduled Meds: . carvedilol  6.25 mg Oral BID WC  . losartan  25 mg Oral QHS  . mirtazapine  15 mg Oral QHS  . sodium chloride  3 mL Intravenous Q12H   Continuous Infusions: . sodium chloride 75 mL/hr at 09/12/13 0626   PRN Meds:.sodium chloride, LORazepam, morphine injection, ondansetron (ZOFRAN) IV, ondansetron, oxyCODONE, senna-docusate, sodium chloride    Assessment/Plan: 47 y.o. female known metastatic breast cancer, rapidly increasing liver tests, abnormal imaging of liver on CT, PET  She is scheduled for liver biopsy today. I am most suspicious for infiltrating cancer in her liver. She has not been taking any meds known to cause  hepatoxicity and does not recall ingesting any unusual substances in past several months (herbs, vits, etc).  Interesting that the intrahepatic ducts are really not dilated much at all. Probably indicates significant infiltrative nature of process.   Rachael Fee, MD  09/12/2013, 7:16 AM [facility] Pager [phone]

## 2013-09-12 NOTE — Progress Notes (Signed)
Patient ID: [name], female   DOB: 1965/10/28, 47 y.o.   MRN: [id number] Request received for US guided liver lesion biopsy in pt with history of metastatic breast carcinoma; now with increasing abd pain, progressive jaundice/elevated LFT's and hypermetabolic central liver lesion noted on recent PET scan. Imaging studies were reviewed by Dr. Fredia Sorrow.Additional PMH as below. Exam: pt awake/alert but weak, markedly jaundiced; chest- CTA bilat; heart- RRR; abd- soft,+BS, tender epigastric region; ext- FROM, no edema.   Filed Vitals:   09/11/13 2129 09/12/13 0510 09/12/13 0540 09/12/13 0627  BP: 105/69 89/54 89/57  95/59  Pulse: 105 82    Temp: 98.9 F (37.2 C) 98.4 F (36.9 C)    TempSrc: Oral Oral    Resp: 18 16    Height:      Weight:      SpO2: 98% 94%     Past Medical History  Diagnosis Date  . Breast cancer 08-02-07    LEFT BREAST INVASIVE MAMMARY CARCINOMA  . 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 Lymphedema of arm     left   Past Surgical History  Procedure Laterality Date  . Cesarean section  2008  Ct Chest W Contrast  08/22/2013   CLINICAL DATA:  Breast cancer, chemotherapy ongoing. Patient's experienced Chest pain during chemotherapy.  EXAM: CT CHEST, ABDOMEN, AND PELVIS WITH CONTRAST  TECHNIQUE: Multidetector CT imaging of the chest, abdomen and pelvis was performed following the standard protocol during bolus administration of intravenous contrast.  CONTRAST:  80mL OMNIPAQUE IOHEXOL 300 MG/ML  SOLN  COMPARISON:  CT 05/16/2013, PET-CT 09/29/2011  FINDINGS: CT CHEST FINDINGS  There is a port in the right chest wall. No axillary or supraclavicular lymphadenopathy. No subpectoral is lymphadenopathy. No internal mammary lymphadenopathy. No mediastinal hilar lymphadenopathy.  There is no central pulmonary embolism. No acute findings of the aorta great vessels. No pericardial fluid. Esophagus is normal.  Review of the lung parenchyma  demonstrates no suspicious pulmonary nodules. No pneumothorax.  CT ABDOMEN AND PELVIS FINDINGS  There is moderate periportal edema. The liver parenchyma is ill-definedcentrally at the junction of the hepatic veins (image 54). There is subcapsular hypodensity along the right hepatic lobe medially (image 68). There is moderate edema of the gallbladder wall. These findings are new from prior. The pancreas, spleen, adrenal glands are normal. The kidneys are normal.  The stomach, small bowel, appendix, and cecum are normal. The colon and rectosigmoid colon are normal.  Abdominal or is normal caliber. No retroperitoneal periportal lymphadenopathy  No free fluid the pelvis. Uterus and ovaries are normal. Bladder is normal. No pelvic lymphadenopathy. Review of bone windows demonstrates no aggressive osseous lesions.  IMPRESSION: 1. Periportal edema, subcapsular hypodensity and gallbladder wall edema of unclear etiology. This could relate to liver dysfunction or volume overload. Cannot exclude hepatic metastasis. Recommend correlation with liver function tests and consider hepatic MRI with without contrast.  2. No evidence of thoracic metastasis.  This was made a call report.   Electronically Signed   By: Genevive Bi M.D.   On: 08/22/2013 17:02   Mr Abdomen W Wo Contrast  08/28/2013   CLINICAL DATA:  Breast carcinoma. Indeterminate liver lesion seen on recent CT.  EXAM: MRI ABDOMEN WITH AND WITHOUT CONTRAST  TECHNIQUE: Multiplanar multisequence MR imaging of the abdomen was performed both before and after administration of intravenous contrast.  CONTRAST:  4 cc Eovist  COMPARISON:  CT on 08/22/2013 and 05/16/2013  FINDINGS: Diffuse periportal edema is seen in both the right and left hepatic lobes, but no liver masses are identified. Nondistended gallbladder also shows mild diffuse wall thickening consistent with edema. There is no evidence of biliary ductal dilatation.  The pancreas, adrenal glands, kidneys, and  spleen are normal in appearance. No soft tissue masses or lymphadenopathy identified within the abdomen or pelvis. No focal inflammatory process or abnormal fluid collections identified.  IMPRESSION: Hepatic periportal and gallbladder wall edema, which is nonspecific. No etiology apparent by MRI.  No evidence of hepatic metastases or other intra-abdominal metastatic disease.   Electronically Signed   By: Myles Rosenthal M.D.   On: 08/28/2013 14:14   Ct Abdomen Pelvis W Contrast  08/22/2013   CLINICAL DATA:  Breast cancer, chemotherapy ongoing. Patient's experienced Chest pain during chemotherapy.  EXAM: CT CHEST, ABDOMEN, AND PELVIS WITH CONTRAST  TECHNIQUE: Multidetector CT imaging of the chest, abdomen and pelvis was performed following the standard protocol during bolus administration of intravenous contrast.  CONTRAST:  80mL OMNIPAQUE IOHEXOL 300 MG/ML  SOLN  COMPARISON:  CT 05/16/2013, PET-CT 09/29/2011  FINDINGS: CT CHEST FINDINGS  There is a port in the right chest wall. No axillary or supraclavicular lymphadenopathy. No subpectoral is lymphadenopathy. No internal mammary lymphadenopathy. No mediastinal hilar lymphadenopathy.  There is no central pulmonary embolism. No acute findings of the aorta great vessels. No pericardial fluid. Esophagus is normal.  Review of the lung parenchyma demonstrates no suspicious pulmonary nodules. No pneumothorax.  CT ABDOMEN AND PELVIS FINDINGS  There is moderate periportal edema. The liver parenchyma is ill-definedcentrally at the junction of the hepatic veins (image 54). There is subcapsular hypodensity along the right hepatic lobe medially (image 68). There is moderate edema of the gallbladder wall. These findings are new from prior. The pancreas, spleen, adrenal glands are normal. The kidneys are normal.  The stomach, small bowel, appendix, and cecum are normal. The colon and rectosigmoid colon are normal.  Abdominal or is normal caliber. No retroperitoneal periportal  lymphadenopathy  No free fluid the pelvis. Uterus and ovaries are normal. Bladder is normal. No pelvic lymphadenopathy. Review of bone windows demonstrates no aggressive osseous lesions.  IMPRESSION: 1. Periportal edema, subcapsular hypodensity and gallbladder wall edema of unclear etiology. This could relate to liver dysfunction or volume overload. Cannot exclude hepatic metastasis. Recommend correlation with liver function tests and consider hepatic MRI with without contrast.  2. No evidence of thoracic metastasis.  This was made a call report.   Electronically Signed   By: Genevive Bi M.D.   On: 08/22/2013 17:02   Nm Hepato W/eject Fract  09/04/2013   CLINICAL DATA:  Elevated bilirubin. History of breast cancer and hepatitis. Abdominal pain and nausea.  EXAM: NUCLEAR MEDICINE HEPATOBILIARY IMAGING  TECHNIQUE: Sequential images of the abdomen were obtained out to 60 minutes following intravenous administration of radiopharmaceutical.  COMPARISON:  MRI 08/28/2013.  RADIOPHARMACEUTICALS:  5 mCi Tc-48m Choletec  FINDINGS: There is delayed hepatic uptake of radiotracer with gradual accumulation of radiotracer over 95 min. The common bile duct is never visualized. There is no visualization of the gallbladder and no excretion of contrast into the small bowel, not unexpected given the lack of excretion of radiotracer from the liver. Free radiotracer is evident in the urinary bladder.  IMPRESSION: Hepatic uptake without excretion is compatible with either profound cholestasis or hepatic dysfunction/ failure.   Electronically Signed   By: Andreas Newport M.D.   On: 09/04/2013 13:48  Results for orders placed  during the hospital encounter of 09/11/13  COMPREHENSIVE METABOLIC PANEL      Result Value Range   Sodium 129 (*) 135 - 145 mEq/L   Potassium 3.4 (*) 3.5 - 5.1 mEq/L   Chloride 93 (*) 96 - 112 mEq/L   CO2 25  19 - 32 mEq/L   Glucose, Bld 100 (*) 70 - 99 mg/dL   BUN 8  6 - 23 mg/dL   Creatinine, Ser  2.13 (*) 0.50 - 1.10 mg/dL   Calcium 8.7  8.4 - 08.6 mg/dL   Total Protein 6.1  6.0 - 8.3 g/dL   Albumin 2.0 (*) 3.5 - 5.2 g/dL   AST 578 (*) 0 - 37 U/L   ALT 77 (*) 0 - 35 U/L   Alkaline Phosphatase 767 (*) 39 - 117 U/L   Total Bilirubin 21.0 (*) 0.3 - 1.2 mg/dL   GFR calc non Af Amer >90  >90 mL/min   GFR calc Af Amer >90  >90 mL/min  CBC      Result Value Range   WBC 9.0  4.0 - 10.5 K/uL   RBC 3.34 (*) 3.87 - 5.11 MIL/uL   Hemoglobin 10.7 (*) 12.0 - 15.0 g/dL   HCT 46.9 (*) 62.9 - 52.8 %   MCV 93.4  78.0 - 100.0 fL   MCH 32.0  26.0 - 34.0 pg   MCHC 34.3  30.0 - 36.0 g/dL   RDW 41.3 (*) 24.4 - 01.0 %   Platelets 281  150 - 400 K/uL  PROTIME-INR      Result Value Range   Prothrombin Time 13.9  11.6 - 15.2 seconds   INR 1.09  0.00 - 1.49   A/P: Pt with hx met breast carcinoma; now with progressive abd pain, jaundice with bili of 21, and hypermetabolic central liver lesion noted on recent PET. Plan is for US guided liver lesion biopsy today. Details/risks of procedure d/w pt/husband with their understanding and consent.

## 2013-09-12 NOTE — Progress Notes (Signed)
Agree.  Will try to perform US biopsy of central liver tumor today.

## 2013-09-12 NOTE — Procedures (Signed)
Procedure:  Ultrasound guided core biopsy of liver Findings:  Central hepatic tumor sampled under US guidance via 17 G needle.  18 G core biopsy x 3.  No complications.

## 2013-09-12 NOTE — Progress Notes (Signed)
INITIAL NUTRITION ASSESSMENT  Pt meets criteria for severe MALNUTRITION in the context of chronic illness as evidenced by <75% estimated energy intake with 9.7% weight loss in the past month in addition to severe muscle wasting and subcutaneous fat loss throughout body.  DOCUMENTATION CODES Per approved criteria  -Severe malnutrition in the context of chronic illness -Underweight   INTERVENTION: - Diet advancement per MD - Recommend Ensure Complete BID when diet advanced - Will continue to monitor   NUTRITION DIAGNOSIS: Inadequate oral intake related to inability to eat as evidenced by NPO.   Goal: Advance diet as tolerated to regular diet  Monitor:  Weights, labs, diet advancement  Reason for Assessment: Nutrition risk, consult  47 y.o. female  Admitting Dx: Elevated LFTs  ASSESSMENT: Pt discussed during multidisciplinary rounds.   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 She was referred by her oncologist to the GI physicians one week ago for evaluation of jaundice and elevated LFTs. Over the past 2 weeks she has developed progressively severe epigastric pain which has been constant radiating into her back associated with nausea and anorexia.  Met with pt who reports 9 pound unintended weight loss in the past 2 months. Reports for the past 2 weeks her intake has only been a bite or two of food at mealtimes. Was not on any nutritional supplements. Denies being hungry today. Noted pt with severe muscle wasting and subcutaneous fat loss throughout body.   - Sodium and potassium low - Alk phos, AST/ALT, and total bilirubin elevated   Height: Ht Readings from Last 1 Encounters:  09/11/13 5\' 1"  (1.549 m)    Weight: Wt Readings from Last 1 Encounters:  09/11/13 84 lb (38.102 kg)    Ideal Body Weight: 105 lb   % Ideal Body Weight: 80%  Wt Readings from Last 10 Encounters:  09/11/13 84 lb (38.102  kg)  09/10/13 84 lb (38.102 kg)  09/03/13 86 lb 9.6 oz (39.282 kg)  08/22/13 89 lb 6.4 oz (40.552 kg)  08/12/13 92 lb 6.4 oz (41.912 kg)  08/07/13 93 lb (42.185 kg)  06/20/13 91 lb 8 oz (41.504 kg)  05/30/13 90 lb 12.8 oz (41.187 kg)  05/09/13 91 lb 14.4 oz (41.686 kg)  05/01/13 91 lb 1.9 oz (41.332 kg)    Usual Body Weight: 93 lb   % Usual Body Weight: 90%  BMI:  Body mass index is 15.88 kg/(m^2). Underweight  Estimated Nutritional Needs: Kcal: 1350-1550 Protein: 60-70g Fluid: 1.3-1.5L/day   Skin: Intact   Diet Order: NPO  EDUCATION NEEDS: -No education needs identified at this time   Intake/Output Summary (Last 24 hours) at 09/12/13 1223 Last data filed at 09/12/13 0510  Gross per 24 hour  Intake    360 ml  Output      0 ml  Net    360 ml    Last BM: 11/19  Labs:   Recent Labs Lab 09/11/13 1101 09/12/13 0511  NA 130* 129*  K 3.6 3.4*  CL 93* 93*  CO2 28 25  BUN 9 8  CREATININE 0.2* 0.45*  CALCIUM 8.7 8.7  GLUCOSE 92 100*    CBG (last 3)  No results found for this basename: GLUCAP,  in the last 72 hours  Scheduled Meds: . carvedilol  6.25 mg Oral BID WC  . losartan  25 mg Oral QHS  . mirtazapine  15 mg Oral QHS  . sodium chloride  3 mL Intravenous Q12H    Continuous Infusions: . sodium chloride 75 mL/hr at 09/12/13 1211    Past Medical History  Diagnosis Date  . Breast cancer 08-02-07    LEFT BREAST INVASIVE MAMMARY CARCINOMA  . 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 Lymphedema of arm     left    Past Surgical History  Procedure Laterality Date  . Cesarean section  [address] [place], [place], [place] [phone] Pager [phone] After Hours Pager

## 2013-09-13 DIAGNOSIS — D638 Anemia in other chronic diseases classified elsewhere: Secondary | ICD-10-CM | POA: Diagnosis present

## 2013-09-13 LAB — CBC
HCT: 29 % — ABNORMAL LOW (ref 36.0–46.0)
MCHC: 34.1 g/dL (ref 30.0–36.0)
MCV: 95.1 fL (ref 78.0–100.0)
RBC: 3.05 MIL/uL — ABNORMAL LOW (ref 3.87–5.11)
RDW: 16.3 % — ABNORMAL HIGH (ref 11.5–15.5)
WBC: 7.4 10*3/uL (ref 4.0–10.5)

## 2013-09-13 LAB — COMPREHENSIVE METABOLIC PANEL
Albumin: 1.6 g/dL — ABNORMAL LOW (ref 3.5–5.2)
BUN: 9 mg/dL (ref 6–23)
Calcium: 8.1 mg/dL — ABNORMAL LOW (ref 8.4–10.5)
Creatinine, Ser: 0.47 mg/dL — ABNORMAL LOW (ref 0.50–1.10)
GFR calc Af Amer: >90 mL/min (ref >90)
Total Protein: 5.6 g/dL — ABNORMAL LOW (ref 6.0–8.3)

## 2013-09-13 MED ORDER — CAMPHOR-MENTHOL 0.5-0.5 % EX LOTN
TOPICAL_LOTION | CUTANEOUS | Status: DC | PRN
  Filled 2013-09-13: qty 222

## 2013-09-13 MED ORDER — CHOLESTYRAMINE 4 G PO PACK
4.0000 g | PACK | ORAL | Status: DC
  Administered 2013-09-13 (×2): 4 g via ORAL
  Filled 2013-09-13 (×5): qty 1

## 2013-09-13 MED ORDER — POTASSIUM CHLORIDE CRYS ER 20 MEQ PO TBCR
20.0000 meq | EXTENDED_RELEASE_TABLET | Freq: Two times a day (BID) | ORAL | Status: DC
  Administered 2013-09-13 – 2013-09-16 (×7): 20 meq via ORAL
  Filled 2013-09-13 (×9): qty 1

## 2013-09-13 MED ORDER — ENSURE COMPLETE PO LIQD
237.0000 mL | Freq: Two times a day (BID) | ORAL | Status: DC
  Administered 2013-09-13 – 2013-09-14 (×3): 237 mL via ORAL

## 2013-09-13 MED ORDER — SODIUM CHLORIDE 0.9 % IV BOLUS (SEPSIS)
500.0000 mL | Freq: Once | INTRAVENOUS | Status: AC
  Administered 2013-09-13: 500 mL via INTRAVENOUS

## 2013-09-13 NOTE — Progress Notes (Signed)
On-call notified regarding morning BP, new orders received.

## 2013-09-13 NOTE — Progress Notes (Signed)
[facility] PROGRESS NOTE   [name] NWG:[id number] DOB: Apr 19, 1966 DOA: 09/11/2013 PCP: No PCP Per Patient Oncologist: Dr. Drue Second  Brief narrative: [name] is an 47 y.o. 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  She was referred by her oncologist to gastroenterology approximately one week prior to admission for evaluation of jaundice and elevated LFTs. Had a PET scan done at [facility] 09/11/2013 which showed abnormal hypermetabolism in the central liver and a grossly abnormal appearing gallbladder. She was admitted for a targeted liver biopsy to assist with diagnosis of the etiology of her acute and rapidly progressive liver failure.  Assessment/Plan: Principal Problem:   Elevated LFTs with jaundice secondary to suspected malignancy of liver (from breast cancer metastasis versus new primary) Patient was admitted for elective liver biopsy which was done 09/12/2013. Continue gentle hydration. Continue to monitor LFTs. PT not elevated. Spoke with Dr. Christella Hartigan who tells me that there are no dilated ducts and therefore a stent is not currently indicated. We'll start cholestyramine for pruritus. 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 Mild.   Hypokalemia Replete.   Anemia of chronic disease Hemoglobin stable. No current indication for transfusion.   Severe protein calorie malnutrition / underweight Seen by dietitian 09/12/2013, advance diet and order Ensure supplements per recommendations.   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 Had MUGA scan 01/17/2013 with EF of 49%. Continue Coreg and losartan.   Malignant neoplasm of breast (female), unspecified site Patient was diagnosed in 2008 status post neoadjuvant chemotherapy consisting of Taxotere, carboplatin and and Herceptin followed by a lumpectomy and sentinel lymph node biopsy. She then underwent  radiation and subsequently finished out one year of adjuvant Herceptin. 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 She initially received Abraxane and Herceptin. Current therapy is every 3 week Herceptin. Her oncologist is aware of her admission, and we'll see her on 09/15/2013.  Code Status: Full. Family Communication: Husband updated at bedside. Disposition Plan: Home when stable.  IV access:  Port-A-Cath right chest wall  Medical Consultants:  Oncology  Dr. Rob Bunting, Gastroenterology  Dr. Irish Lack, Interventional radiology  Other Consultants:  None.  Anti-infectives:  None.  HPI/Subjective: [name] states her pain is controlled with her current pain medicines. No nausea or vomiting. Appetite fair. Has some complaints of itchy skin.  Objective: Filed Vitals:   09/12/13 1619 09/12/13 2107 09/13/13 0549 09/13/13 0637  BP: 96/56 97/59 82/55  95/57  Pulse: 73 79 74   Temp: 98.7 F (37.1 C) 98.2 F (36.8 C) 98.4 F (36.9 C)   TempSrc:  Oral Oral   Resp: 16 16 15    Height:      Weight:      SpO2: 99% 98% 95%     Intake/Output Summary (Last 24 hours) at 09/13/13 0726 Last data filed at 09/13/13 0200  Gross per 24 hour  Intake    600 ml  Output      0 ml  Net    600 ml    Exam: Gen:  NAD, cachectic appearing, jaundiced Cardiovascular:  RRR, No M/R/G Respiratory:  Lungs CTAB Gastrointestinal:  Abdomen soft, NT/ND, + BS Extremities:  No C/E/C  Data Reviewed: asic Metabolic Panel:  Recent Labs Lab 09/11/13 1101 09/12/13 0511 09/13/13 0520  NA 130* 129* 130*  K 3.6 3.4* 3.2*  CL 93* 93* 97  CO2 28 25 24   GLUCOSE  92 100* 105*  BUN 9 8 9   CREATININE 0.2* 0.45* 0.47*  CALCIUM 8.7 8.7 8.1*   GFR Estimated Creatinine Clearance: 51.7 ml/min (by C-G formula based on Cr of 0.47). Liver Function Tests:  Recent Labs Lab 09/09/13 1155 09/11/13 1101 09/12/13 0511 09/13/13 0520  AST 162* 152* 135* 120*  ALT 96* 89* 77*  66*  ALKPHOS 830* 741* 767* 696*  BILITOT 24.1* 25.8* 21.0* 19.5*  PROT 7.4 7.1 6.1 5.6*  ALBUMIN 2.6* 2.3* 2.0* 1.6*   Coagulation profile  Recent Labs Lab 09/09/13 1155 09/11/13 1317 09/12/13 0511  INR 1.4* 1.12 1.09    CBC:  Recent Labs Lab 09/09/13 1155 09/12/13 0511 09/13/13 0520  WBC 9.6 9.0 7.4  NEUTROABS 6.9  --   --   HGB 11.9* 10.7* 9.9*  HCT 35.1* 31.2* 29.0*  MCV 96.3 93.4 95.1  PLT 313.0 281 237     Procedures and Diagnostic Studies: Nm Hepato W/eject Fract  09/04/2013   CLINICAL DATA:  Elevated bilirubin. History of breast cancer and hepatitis. Abdominal pain and nausea.  EXAM: NUCLEAR MEDICINE HEPATOBILIARY IMAGING  TECHNIQUE: Sequential images of the abdomen were obtained out to 60 minutes following intravenous administration of radiopharmaceutical.  COMPARISON:  MRI 08/28/2013.  RADIOPHARMACEUTICALS:  5 mCi Tc-76m Choletec  FINDINGS: There is delayed hepatic uptake of radiotracer with gradual accumulation of radiotracer over 95 min. The common bile duct is never visualized. There is no visualization of the gallbladder and no excretion of contrast into the small bowel, not unexpected given the lack of excretion of radiotracer from the liver. Free radiotracer is evident in the urinary bladder.  IMPRESSION: Hepatic uptake without excretion is compatible with either profound cholestasis or hepatic dysfunction/ failure.   Electronically Signed   By: Andreas Newport M.D.   On: 09/04/2013 13:48    Scheduled Meds: . carvedilol  6.25 mg Oral BID WC  . losartan  25 mg Oral QHS  . mirtazapine  15 mg Oral QHS  . sodium chloride  3 mL Intravenous Q12H   Continuous Infusions: . sodium chloride 75 mL/hr at 09/12/13 2106    Time spent: 25 minutes.    LOS: 2 days   [name]  [facility] Pager [phone].   *Please note that the hospitalists switch teams on Wednesdays. Please call the flow manager at [phone] if you are having difficulty reaching the  hospitalist taking care of this patient as she can update you and provide the most up-to-date pager number of provider caring for the patient. If 8PM-8AM, please contact night-coverage at [email or web address], password [facility]  09/13/2013, 7:26 AM

## 2013-09-14 LAB — COMPREHENSIVE METABOLIC PANEL
ALT: 64 U/L — ABNORMAL HIGH (ref 0–35)
AST: 128 U/L — ABNORMAL HIGH (ref 0–37)
Albumin: 1.6 g/dL — ABNORMAL LOW (ref 3.5–5.2)
Alkaline Phosphatase: 662 U/L — ABNORMAL HIGH (ref 39–117)
BUN: 4 mg/dL — ABNORMAL LOW (ref 6–23)
CO2: 24 mEq/L (ref 19–32)
Calcium: 8.2 mg/dL — ABNORMAL LOW (ref 8.4–10.5)
Chloride: 97 mEq/L (ref 96–112)
Creatinine, Ser: 0.46 mg/dL — ABNORMAL LOW (ref 0.50–1.10)
GFR calc non Af Amer: >90 mL/min (ref >90)
Potassium: 3.6 mEq/L (ref 3.5–5.1)
Sodium: 131 mEq/L — ABNORMAL LOW (ref 135–145)
Total Bilirubin: 20 mg/dL (ref 0.3–1.2)

## 2013-09-14 MED ORDER — DRONABINOL 2.5 MG PO CAPS
2.5000 mg | ORAL_CAPSULE | Freq: Two times a day (BID) | ORAL | Status: DC
  Administered 2013-09-14 – 2013-09-16 (×3): 2.5 mg via ORAL
  Filled 2013-09-14 (×3): qty 1

## 2013-09-14 NOTE — Progress Notes (Signed)
[facility] Progress Note    Since last GI note: S/p liver biopsy, awaiting path and oncology input.    Objective: Vital signs in last 24 hours: Temp:  [98.7 F (37.1 C)-99.8 F (37.7 C)] 98.7 F (37.1 C) (11/23 0522) Pulse Rate:  [76-93] 80 (11/23 0522) Resp:  [14-16] 14 (11/23 0522) BP: (91-96)/(56-64) 91/61 mmHg (11/23 0522) SpO2:  [97 %-98 %] 98 % (11/23 0522) Last BM Date: 09/10/13 General: alert and oriented times 3 Heart: regular rate and rythm Abdomen: soft, non-tender, non-distended, normal bowel sounds   Lab Results:  Recent Labs  09/12/13 0511 09/13/13 0520  WBC 9.0 7.4  HGB 10.7* 9.9*  PLT 281 237  MCV 93.4 95.1    Recent Labs  09/12/13 0511 09/13/13 0520 09/14/13 0645  NA 129* 130* 131*  K 3.4* 3.2* 3.6  CL 93* 97 97  CO2 25 24 24   GLUCOSE 100* 105* 91  BUN 8 9 4*  CREATININE 0.45* 0.47* 0.46*  CALCIUM 8.7 8.1* 8.2*    Recent Labs  09/12/13 0511 09/13/13 0520 09/14/13 0645  PROT 6.1 5.6* 5.4*  ALBUMIN 2.0* 1.6* 1.6*  AST 135* 120* 128*  ALT 77* 66* 64*  ALKPHOS 767* 696* 662*  BILITOT 21.0* 19.5* 20.0*    Recent Labs  09/11/13 1317 09/12/13 0511  INR 1.12 1.09     Studies/Results: US Biopsy  09/12/2013   CLINICAL DATA:  History of breast carcinoma. Central tumor of the liver with jaundice and hepatic failure. A PET scan shows abnormal hypermetabolic activity within the central hepatic tumor.  EXAM: ULTRASOUND GUIDED CORE BIOPSY OF LIVER  MEDICATIONS: 0.5 mg IV Versed; 25 mcg IV Fentanyl  Total Moderate Sedation Time: 15  PROCEDURE: The procedure, risks, benefits, and alternatives were explained to the patient. Questions regarding the procedure were encouraged and answered. The patient understands and consents to the procedure.  The abdominal wall was prepped with Betadine in a sterile fashion, and a sterile drape was applied covering the operative field. A sterile gown and sterile gloves were used for the procedure.  Local anesthesia was provided with 1% Lidocaine.  Ultrasound was used to localize hepatic tumor. Under ultrasound guidance, a 17 gauge needle was advanced into the liver. A total of three separate coaxial 18 gauge needle core biopsy samples were obtained. Material was submitted in formalin.  COMPLICATIONS: None.  FINDINGS: Vague hypoechoic central tumor in the liver was visible by ultrasound. Solid tissue was obtained.  IMPRESSION: Ultrasound-guided core biopsy performed of the central hepatic tumor.   Electronically Signed   By: Irish Lack M.D.   On: 09/12/2013 18:27     Medications: Scheduled Meds: . carvedilol  6.25 mg Oral BID WC  . cholestyramine  4 g Oral Custom  . feeding supplement (ENSURE COMPLETE)  237 mL Oral BID BM  . losartan  25 mg Oral QHS  . mirtazapine  15 mg Oral QHS  . potassium chloride  20 mEq Oral BID  . sodium chloride  3 mL Intravenous Q12H   Continuous Infusions: . sodium chloride 75 mL/hr at 09/12/13 2106   PRN Meds:.sodium chloride, camphor-menthol, LORazepam, morphine injection, ondansetron (ZOFRAN) IV, ondansetron, oxyCODONE, senna-docusate, sodium chloride    Assessment/Plan: 47 y.o. female (probable) metastatic breast cancer involving large portion of central liver, very elevated liver tests  Core biopsies taken on Friday via US guidance. Await final pathology reading Await oncology input. LFTs are very elevated, but seem to have stabilized for now.  INR is normal  and no signs of encephalopathy. She is eating poorly, I'm ordering boost/ensure with every meal.  Imaging shows only very minimal dilation of a single peripheral intrahepatic duct. It is not clear to me if PTC or ERCP to that site would alter her hepatic function significantly.   Rachael Fee, MD  09/14/2013, 8:36 AM [facility] Pager [phone]

## 2013-09-14 NOTE — Progress Notes (Signed)
[facility] PROGRESS NOTE   [name] ZOX:[id number] DOB: 1966/09/16 DOA: 09/11/2013 PCP: No PCP Per Patient Oncologist: Dr. Drue Second  Brief narrative: [name] is an 47 y.o. 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  She was referred by her oncologist to gastroenterology approximately one week prior to admission for evaluation of jaundice and elevated LFTs. Had a PET scan done at [facility] 09/11/2013 which showed abnormal hypermetabolism in the central liver and a grossly abnormal appearing gallbladder. She was admitted for a targeted liver biopsy to assist with diagnosis of the etiology of her acute and rapidly progressive liver failure.  Assessment/Plan: Principal Problem:   Elevated LFTs with jaundice secondary to suspected malignancy of liver (from breast cancer metastasis versus new primary) Patient was admitted for elective liver biopsy which was done 09/12/2013. Continue gentle hydration. Continue to monitor LFTs. PT not elevated. Spoke with Dr. Christella Hartigan who tells me that there are no dilated ducts and therefore a stent is not currently indicated. Unable to tolerate cholestyramine for pruritus.  Sarna prescribed. 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 Mild.   Hypokalemia Repleted.   Anemia of chronic disease Hemoglobin stable. No current indication for transfusion.   Severe protein calorie malnutrition / underweight Seen by dietitian 09/12/2013, continue Ensure supplements per recommendations. Still with poor appetite despite being on Remeron. Will add Marinol.   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 Had MUGA scan 01/17/2013 with EF of 49%. Continue Coreg and losartan.   Malignant neoplasm of breast (female), unspecified site Patient was diagnosed in 2008 status post neoadjuvant chemotherapy consisting of Taxotere, carboplatin and and  Herceptin followed by a lumpectomy and sentinel lymph node biopsy. She then underwent radiation and subsequently finished out one year of adjuvant Herceptin. 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 She initially received Abraxane and Herceptin. Current therapy is every 3 week Herceptin. Her oncologist is aware of her admission, and will see her tomorrow morning.  Code Status: Full. Family Communication: Husband updated at bedside. Disposition Plan: Home when stable.  IV access:  Port-A-Cath right chest wall  Medical Consultants:  Oncology  Dr. Rob Bunting, Gastroenterology  Dr. Irish Lack, Interventional radiology  Other Consultants:  None.  Anti-infectives:  None.  HPI/Subjective: [name] was unable to tolerate cholestyramine for itchy skin. Appetite remains poor. Has not moved her bowels in several days, but declines a laxative. No nausea or vomiting. No dyspnea. Pain controlled.  Objective: Filed Vitals:   09/13/13 0637 09/13/13 1430 09/13/13 2030 09/14/13 0522  BP: 95/57 96/64 94/56  91/61  Pulse:  76 93 80  Temp:  98.9 F (37.2 C) 99.8 F (37.7 C) 98.7 F (37.1 C)  TempSrc:  Oral Oral Oral  Resp:  16 16 14   Height:      Weight:      SpO2:  97% 98% 98%    Intake/Output Summary (Last 24 hours) at 09/14/13 1058 Last data filed at 09/14/13 0600  Gross per 24 hour  Intake   1440 ml  Output      0 ml  Net   1440 ml    Exam: Gen:  NAD, cachectic appearing, jaundiced Cardiovascular:  RRR, No M/R/G Respiratory:  Lungs CTAB Gastrointestinal:  Abdomen soft, NT/ND, + BS Extremities:  No C/E/C  Data Reviewed: asic Metabolic Panel:  Recent Labs Lab 09/11/13 1101 09/12/13 0511 09/13/13 0520 09/14/13 0645  NA 130* 129* 130* 131*  K 3.6 3.4* 3.2* 3.6  CL 93* 93* 97 97  CO2 28 25 24 24   GLUCOSE 92 100* 105* 91  BUN 9 8 9  4*  CREATININE 0.2* 0.45* 0.47* 0.46*  CALCIUM 8.7 8.7 8.1* 8.2*   GFR Estimated Creatinine Clearance:  51.7 ml/min (by C-G formula based on Cr of 0.46). Liver Function Tests:  Recent Labs Lab 09/09/13 1155 09/11/13 1101 09/12/13 0511 09/13/13 0520 09/14/13 0645  AST 162* 152* 135* 120* 128*  ALT 96* 89* 77* 66* 64*  ALKPHOS 830* 741* 767* 696* 662*  BILITOT 24.1* 25.8* 21.0* 19.5* 20.0*  PROT 7.4 7.1 6.1 5.6* 5.4*  ALBUMIN 2.6* 2.3* 2.0* 1.6* 1.6*   Coagulation profile  Recent Labs Lab 09/09/13 1155 09/11/13 1317 09/12/13 0511  INR 1.4* 1.12 1.09    CBC:  Recent Labs Lab 09/09/13 1155 09/12/13 0511 09/13/13 0520  WBC 9.6 9.0 7.4  NEUTROABS 6.9  --   --   HGB 11.9* 10.7* 9.9*  HCT 35.1* 31.2* 29.0*  MCV 96.3 93.4 95.1  PLT 313.0 281 237     Procedures and Diagnostic Studies: Nm Hepato W/eject Fract  09/04/2013   CLINICAL DATA:  Elevated bilirubin. History of breast cancer and hepatitis. Abdominal pain and nausea.  EXAM: NUCLEAR MEDICINE HEPATOBILIARY IMAGING  TECHNIQUE: Sequential images of the abdomen were obtained out to 60 minutes following intravenous administration of radiopharmaceutical.  COMPARISON:  MRI 08/28/2013.  RADIOPHARMACEUTICALS:  5 mCi Tc-64m Choletec  FINDINGS: There is delayed hepatic uptake of radiotracer with gradual accumulation of radiotracer over 95 min. The common bile duct is never visualized. There is no visualization of the gallbladder and no excretion of contrast into the small bowel, not unexpected given the lack of excretion of radiotracer from the liver. Free radiotracer is evident in the urinary bladder.  IMPRESSION: Hepatic uptake without excretion is compatible with either profound cholestasis or hepatic dysfunction/ failure.   Electronically Signed   By: Andreas Newport M.D.   On: 09/04/2013 13:48    Scheduled Meds: . carvedilol  6.25 mg Oral BID WC  . cholestyramine  4 g Oral Custom  . feeding supplement (ENSURE COMPLETE)  237 mL Oral BID BM  . losartan  25 mg Oral QHS  . mirtazapine  15 mg Oral QHS  . potassium chloride  20  mEq Oral BID  . sodium chloride  3 mL Intravenous Q12H   Continuous Infusions: . sodium chloride 75 mL/hr at 09/12/13 2106    Time spent: 25 minutes.    LOS: 3 days   [name]  [facility] Pager [phone].   *Please note that the hospitalists switch teams on Wednesdays. Please call the flow manager at [phone] if you are having difficulty reaching the hospitalist taking care of this patient as she can update you and provide the most up-to-date pager number of provider caring for the patient. If 8PM-8AM, please contact night-coverage at [email or web address], password [facility]  09/14/2013, 10:58 AM

## 2013-09-15 DIAGNOSIS — E43 Unspecified severe protein-calorie malnutrition: Secondary | ICD-10-CM

## 2013-09-15 LAB — CBC
HCT: 29.3 % — ABNORMAL LOW (ref 36.0–46.0)
Hemoglobin: 10.3 g/dL — ABNORMAL LOW (ref 12.0–15.0)
MCV: 94.5 fL (ref 78.0–100.0)
RDW: 16.4 % — ABNORMAL HIGH (ref 11.5–15.5)
WBC: 9.1 10*3/uL (ref 4.0–10.5)

## 2013-09-15 LAB — AFP TUMOR MARKER: AFP-Tumor Marker: <1.3 ng/mL (ref 0.0–8.0)

## 2013-09-15 LAB — COMPREHENSIVE METABOLIC PANEL
Albumin: 1.7 g/dL — ABNORMAL LOW (ref 3.5–5.2)
Alkaline Phosphatase: 664 U/L — ABNORMAL HIGH (ref 39–117)
BUN: 5 mg/dL — ABNORMAL LOW (ref 6–23)
CO2: 24 mEq/L (ref 19–32)
Calcium: 8.5 mg/dL (ref 8.4–10.5)
Chloride: 95 mEq/L — ABNORMAL LOW (ref 96–112)
GFR calc Af Amer: >90 mL/min (ref >90)
Glucose, Bld: 94 mg/dL (ref 70–99)
Potassium: 3.5 mEq/L (ref 3.5–5.1)
Sodium: 130 mEq/L — ABNORMAL LOW (ref 135–145)
Total Protein: 5.7 g/dL — ABNORMAL LOW (ref 6.0–8.3)

## 2013-09-15 LAB — PROTIME-INR: Prothrombin Time: 16.6 seconds — ABNORMAL HIGH (ref 11.6–15.2)

## 2013-09-15 LAB — CANCER ANTIGEN 19-9: CA 19-9: 466.3 U/mL — ABNORMAL HIGH (ref <35.0)

## 2013-09-15 NOTE — Progress Notes (Signed)
[facility] PROGRESS NOTE   [name] WUJ:[id number] DOB: 01-02-1966 DOA: 09/11/2013 PCP: No PCP Per Patient Oncologist: Dr. Drue Second  Brief narrative: [name] is an 47 y.o. 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  She was referred by her oncologist to gastroenterology approximately one week prior to admission for evaluation of jaundice and elevated LFTs. Had a PET scan done at [facility] 09/11/2013 which showed abnormal hypermetabolism in the central liver and a grossly abnormal appearing gallbladder. She was admitted for a targeted liver biopsy to assist with diagnosis of the etiology of her acute and rapidly progressive liver failure.  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 Followup AFP and CA 19-9. Continue gentle hydration. Continue to monitor LFTs. PT not elevated. Spoke with Dr. Christella Hartigan who tells me that there are no dilated ducts and therefore a stent is not currently indicated. Unable to tolerate cholestyramine for pruritus.  Sarna prescribed. Awaiting formal oncology evaluation by Dr. Welton Flakes. 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 Mild.   Hypokalemia Repleted.   Anemia of chronic disease Hemoglobin stable. No current indication for transfusion.   Severe protein calorie malnutrition / underweight Seen by dietitian 09/12/2013, continue Ensure supplements per recommendations. Still with poor appetite despite being on Remeron. Will add Marinol.   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 Had MUGA scan 01/17/2013 with EF of 49%. Continue Coreg and losartan.   Malignant neoplasm of breast (female), unspecified site Patient was diagnosed in 2008 status post neoadjuvant chemotherapy consisting of Taxotere, carboplatin and and Herceptin followed by a lumpectomy and sentinel lymph node biopsy. She then underwent radiation and subsequently finished out one year of adjuvant Herceptin. 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 She initially received Abraxane and Herceptin. Current therapy is every 3 week Herceptin. Her oncologist is aware of her admission, and will see her today.  Code Status: Full. Family Communication: Husband updated at bedside. Disposition Plan: Home when stable.  IV access:  Port-A-Cath right chest wall  Medical Consultants:  Oncology  Dr. Rob Bunting, Gastroenterology  Dr. Irish Lack, Interventional radiology  Other Consultants:  None.  Anti-infectives:  None.  HPI/Subjective: [name] is currently lethargic and asleep. Continues to have poor appetite but is able to take in some supplements. Still having abdominal pain, but controlled with pain medications. No dyspnea or cough.  Objective: Filed Vitals:   09/14/13 1345 09/14/13 1507 09/14/13 2048 09/15/13 0550  BP: 100/68 93/61 97/59  100/62  Pulse: 80  88 85  Temp: 98.8 F (37.1 C)  98.3 F (36.8 C) 98.9 F (37.2 C)  TempSrc: Oral  Oral Oral  Resp: 18  16 14   Height:      Weight:      SpO2: 100%  95% 94%    Intake/Output Summary (Last 24 hours) at 09/15/13 0759 Last data filed at 09/14/13 2200  Gross per 24 hour  Intake   2000 ml  Output      0 ml  Net   2000 ml    Exam: Gen:  NAD, cachectic appearing, jaundiced Cardiovascular:  RRR,  No M/R/G Respiratory:  Lungs CTAB Gastrointestinal:  Abdomen soft, NT/ND, + BS Extremities:  No C/E/C  Data Reviewed: asic Metabolic Panel:  Recent Labs Lab 09/11/13 1101 09/12/13 0511  09/13/13 0520 09/14/13 0645 09/15/13 0540  NA 130* 129* 130* 131* 130*  K 3.6 3.4* 3.2* 3.6 3.5  CL 93* 93* 97 97 95*  CO2 28 25 24 24 24   GLUCOSE 92 100* 105* 91 94  BUN 9 8 9  4* 5*  CREATININE 0.2* 0.45* 0.47* 0.46* 0.41*  CALCIUM 8.7 8.7 8.1* 8.2* 8.5   GFR Estimated Creatinine Clearance: 51.7 ml/min (by C-G formula based on Cr of 0.41). Liver Function Tests:  Recent Labs Lab 09/11/13 1101 09/12/13 0511 09/13/13 0520 09/14/13 0645 09/15/13 0540  AST 152* 135* 120* 128* 138*  ALT 89* 77* 66* 64* 67*  ALKPHOS 741* 767* 696* 662* 664*  BILITOT 25.8* 21.0* 19.5* 20.0* 21.3*  PROT 7.1 6.1 5.6* 5.4* 5.7*  ALBUMIN 2.3* 2.0* 1.6* 1.6* 1.7*   Coagulation profile  Recent Labs Lab 09/09/13 1155 09/11/13 1317 09/12/13 0511  INR 1.4* 1.12 1.09    CBC:  Recent Labs Lab 09/09/13 1155 09/12/13 0511 09/13/13 0520 09/15/13 0540  WBC 9.6 9.0 7.4 9.1  NEUTROABS 6.9  --   --   --   HGB 11.9* 10.7* 9.9* 10.3*  HCT 35.1* 31.2* 29.0* 29.3*  MCV 96.3 93.4 95.1 94.5  PLT 313.0 281 237 217     Procedures and Diagnostic Studies: Nm Hepato W/eject Fract  09/04/2013   CLINICAL DATA:  Elevated bilirubin. History of breast cancer and hepatitis. Abdominal pain and nausea.  EXAM: NUCLEAR MEDICINE HEPATOBILIARY IMAGING  TECHNIQUE: Sequential images of the abdomen were obtained out to 60 minutes following intravenous administration of radiopharmaceutical.  COMPARISON:  MRI 08/28/2013.  RADIOPHARMACEUTICALS:  5 mCi Tc-6m Choletec  FINDINGS: There is delayed hepatic uptake of radiotracer with gradual accumulation of radiotracer over 95 min. The common bile duct is never visualized. There is no visualization of the gallbladder and no excretion of contrast into the small bowel, not unexpected given the lack of excretion of radiotracer from the liver. Free radiotracer is evident in the urinary bladder.  IMPRESSION: Hepatic uptake without excretion is compatible with either profound  cholestasis or hepatic dysfunction/ failure.   Electronically Signed   By: Andreas Newport M.D.   On: 09/04/2013 13:48    Scheduled Meds: . carvedilol  6.25 mg Oral BID WC  . cholestyramine  4 g Oral Custom  . dronabinol  2.5 mg Oral BID AC  . feeding supplement (ENSURE COMPLETE)  237 mL Oral BID BM  . losartan  25 mg Oral QHS  . mirtazapine  15 mg Oral QHS  . potassium chloride  20 mEq Oral BID  . sodium chloride  3 mL Intravenous Q12H   Continuous Infusions: . sodium chloride 75 mL/hr at 09/15/13 0548    Time spent: 25 minutes.    LOS: 4 days   Jene Oravec  [facility] Pager [phone].   *Please note that the hospitalists switch teams on Wednesdays. Please call the flow manager at [phone] if you are having difficulty reaching the hospitalist taking care of this patient as she can update you and provide the most up-to-date pager number of provider caring for the patient. If 8PM-8AM, please contact night-coverage at [email or web address], password [facility]  09/15/2013, 7:59 AM

## 2013-09-15 NOTE — Progress Notes (Signed)
[facility] Progress Note  Subjective:  Not eating much.  Drinking some Ensure/boost shakes.  Still has pain but is controlled with pain meds.  Objective:  Vital signs in last 24 hours: Temp:  [98.3 F (36.8 C)-98.9 F (37.2 C)] 98.9 F (37.2 C) (11/24 0550) Pulse Rate:  [80-88] 85 (11/24 0550) Resp:  [14-18] 14 (11/24 0550) BP: (93-100)/(59-68) 100/62 mmHg (11/24 0550) SpO2:  [94 %-100 %] 94 % (11/24 0550) Last BM Date: 09/10/13 General:   Alert, thin, in NAD; jaundice present Heart:  Regular rate and rhythm; no murmurs Pulm:  CTAB.  No W/R/R. Abdomen:  Soft, non-distended.  BS present.  Epigastric TTP. Extremities:  Without edema. Neurologic:  Alert and oriented x4;  grossly normal neurologically. Psych:  Alert and cooperative. Normal mood and affect.  Intake/Output from previous day: 11/23 0701 - 11/24 0700 In: 2000 [P.O.:800; I.V.:1200] Out: -   Lab Results:  Recent Labs  09/13/13 0520 09/15/13 0540  WBC 7.4 9.1  HGB 9.9* 10.3*  HCT 29.0* 29.3*  PLT 237 217   BMET  Recent Labs  09/13/13 0520 09/14/13 0645 09/15/13 0540  NA 130* 131* 130*  K 3.2* 3.6 3.5  CL 97 97 95*  CO2 24 24 24   GLUCOSE 105* 91 94  BUN 9 4* 5*  CREATININE 0.47* 0.46* 0.41*  CALCIUM 8.1* 8.2* 8.5   LFT  Recent Labs  09/15/13 0540  PROT 5.7*  ALBUMIN 1.7*  AST 138*  ALT 67*  ALKPHOS 664*  BILITOT 21.3*   Assessment / Plan: 47 y.o. female with suspected metastatic breast cancer involving large portion of central liver, very elevated LFT's.  S/p targeted liver biopsy on 11/21.  Preliminary path report confirming malignancy but unable to differentiate if this is metastatic breast vs primary HCC vs cholangiocarcinoma; pathologist is going to perform estrogen stains, etc.    *Awaiting oncology input as well. *Will check AFP and CA19-9. *Monitor LFT's and coags. *Imaging shows only very minimal dilation of a single peripheral intrahepatic duct.  PTC or ERCP with  consideration of stenting would not improve her hepatic function.    LOS: 4 days   ZEHR, JESSICA D.  09/15/2013, 8:25 AM  Pager number [phone]    Attending physician's note   I have taken an interval history, reviewed the chart and examined the patient. I agree with the Advanced Practitioner's note, impression and recommendations. I received a verbal report from Dr. Dierdre Searles on the liver biopsy. 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 Special stains pending to look for ER. She does not have known underlying liver disease/cirrhosis so HCC would be unlikely in this setting. Send CA19-9 and AFP. Await special pathology stains. I have informed the patient on the liver biopsy results showing cancer.  Venita Lick. Russella Dar, MD [facility]

## 2013-09-16 MED ORDER — OXYCODONE HCL 5 MG PO TABS: 5.0000 mg | ORAL_TABLET | ORAL | Status: DC | PRN

## 2013-09-16 MED ORDER — ONDANSETRON HCL 4 MG PO TABS: 4.0000 mg | ORAL_TABLET | Freq: Four times a day (QID) | ORAL | Status: AC | PRN

## 2013-09-16 MED ORDER — HEPARIN SOD (PORK) LOCK FLUSH 100 UNIT/ML IV SOLN
500.0000 [IU] | Freq: Once | INTRAVENOUS | Status: AC
  Administered 2013-09-16: 500 [IU] via INTRAVENOUS
  Filled 2013-09-16: qty 5

## 2013-09-16 MED ORDER — OXYCODONE HCL ER 10 MG PO T12A: 10.0000 mg | EXTENDED_RELEASE_TABLET | Freq: Two times a day (BID) | ORAL | Status: DC

## 2013-09-16 MED ORDER — DRONABINOL 5 MG PO CAPS: 5.0000 mg | ORAL_CAPSULE | Freq: Two times a day (BID) | ORAL | Status: AC

## 2013-09-16 MED ORDER — CAMPHOR-MENTHOL 0.5-0.5 % EX LOTN: TOPICAL_LOTION | CUTANEOUS | Status: AC | PRN

## 2013-09-16 NOTE — Progress Notes (Addendum)
Dr. Park Breed has apparently recommended no further treatment. AFP normal and CA19-9 is elevated at 446.3. The CA19-9 can be elevated in cholangiocarcinoma and with other processes that involve the bile ducts so an elevated CA19-9 is not diagnostic for cholangiocarcinoma. Dr. Dierdre Searles gave me a verbal update on pathology today. All special stains were negative. The liver biopsy shows a poorly differentiated carcinoma. So far pathology cannot determine whether this is metastatic breast or another type of cancer. No further recommendations.

## 2013-09-16 NOTE — Telephone Encounter (Signed)
Pt had PET at [place]

## 2013-09-16 NOTE — Care Management Note (Signed)
Cm referred patient for [facility] concerning community hospice and palliative care per MD dc summary. Agency to follow up with pt as outpatient.   Roxy Manns Amitai Delaughter,RN,MSN [phone]

## 2013-09-16 NOTE — Telephone Encounter (Signed)
Pt had the PET at [facility]

## 2013-09-16 NOTE — Discharge Summary (Signed)
Physician Discharge Summary  [name] ZOX:[id number] DOB: 04/19/1966 DOA: 09/11/2013  PCP: No PCP Per Patient Oncologist: Dr. Drue Second  Admit date: 09/11/2013 Discharge date: 09/16/2013  Recommendations for Outpatient Follow-up:  1. Consider outpatient referral to Hospice if no further treatment options.  Discharge Diagnoses:  Principal Problem:    Elevated LFTs with jaundice secondary to suspected malignancy of liver (from breast cancer metastasis versus new primary) Active Problems:    Malignant neoplasm of breast (female), unspecified site    Cardiomyopathy due to chemotherapy    Hypokalemia    Hyponatremia    Normocytic anemia    Chronic systolic CHF (congestive heart failure)    Protein-calorie malnutrition, severe    Anemia of chronic disease   Discharge Condition: Terminal.  Diet recommendation: Regular.  History of present illness:  [name] is an 47 y.o. 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 She was referred by her oncologist to gastroenterology approximately one week prior to admission for evaluation of jaundice and elevated LFTs. Had a PET scan done at [facility] 09/11/2013 which showed abnormal hypermetabolism in the central liver and a grossly abnormal appearing gallbladder. She was admitted for a targeted liver biopsy to assist with diagnosis of the etiology of her acute and rapidly progressive liver failure.  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 AFP not elevated.  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 Treated with gentle hydration, but LFTs continue to trend up. PT was not elevated. Spoke with Dr. Christella Hartigan who felt a stent was not currently indicated. Unable to tolerate cholestyramine for pruritus. Sarna prescribed. Dr. Welton Flakes saw the patient 09/15/2013 and told her that she did not feel that further treatment with chemotherapy would be of any benefit.  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  Hypokalemia  Repleted.  Anemia of chronic disease  Hemoglobin stable. No current indication for transfusion.  Severe protein calorie malnutrition / underweight  Seen by dietitian 09/12/2013, continue Ensure supplements per recommendations. Still with poor appetite despite being on Remeron. Discharged home on Marinol.  Chronic systolic CHF / cardiomyopathy due to chemotherapy  Likely Herceptin induced. Had MUGA scan 01/17/2013 with EF of 49%. Continue Coreg and losartan.  Malignant neoplasm of breast (female), unspecified site  Patient was diagnosed in 2008 status post neoadjuvant chemotherapy consisting of Taxotere, carboplatin and and Herceptin followed by a lumpectomy and sentinel lymph node biopsy. She then underwent radiation and subsequently finished out one year of adjuvant Herceptin. 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 She initially received Abraxane and Herceptin. Current therapy is every 3 week Herceptin. Her oncologist does not feel that the patient would benefit from further therapy.  Procedures:  Liver biopsy 09/12/2013  Consultations: Dr. Drue Second, Oncology  Dr. Rob Bunting, Gastroenterology  Dr. Irish Lack, Interventional radiology  Discharge Exam: Filed Vitals:   09/16/13 0609  BP: 90/55  Pulse:   Temp:   Resp:    Filed Vitals:   09/15/13 2155 09/16/13 0604 09/16/13 0605 09/16/13 0609  BP: 91/60 81/49 88/49  90/55  Pulse: 88 75  Temp: 98.5 F (36.9 C) 98.2 F  (36.8 C)    TempSrc: Oral Oral    Resp: 20 20    Height:      Weight:      SpO2: 99% 96%      Gen:  NAD, jaundiced Cardiovascular:  RRR, No M/R/G Respiratory: Lungs CTAB Gastrointestinal: Abdomen soft, NT/ND with normal active bowel sounds. Extremities: No C/E/C   Discharge Instructions  Discharge Orders   Future Orders Complete By Expires   Call MD for:  persistant nausea and vomiting  As directed    Call MD for:  severe uncontrolled pain  As directed    Call MD for:  temperature >100.4  As directed    Diet general  As directed    Discharge instructions  As directed    Comments:     You were cared for by Dr. Hillery Aldo  (a hospitalist) during your hospital stay. If you have any questions about your discharge medications or the care you received while you were in the hospital after you are discharged, you can call the unit and ask to speak with the hospitalist on call if the hospitalist that took care of you is not available. Once you are discharged, your primary care physician will handle any further medical issues. Please note that NO REFILLS for any discharge medications will be authorized once you are discharged, as it is imperative that you return to your primary care physician (or establish a relationship with a primary care physician if you do not have one) for your aftercare needs so that they can reassess your need for medications and monitor your lab values.  Any outstanding tests can be reviewed by your PCP at your follow up visit.  It is also important to review any medicine changes with your PCP.  Please bring these d/c instructions with you to your next visit so your physician can review these changes with you.  If you do not have a primary care physician, you can call [phone] for a physician referral.  It is highly recommended that you obtain a PCP for hospital follow up.   Increase activity slowly  As directed        Medication List    STOP taking these  medications       PRESCRIPTION MEDICATION      TAKE these medications       BIOTIN PO  Take 1 tablet by mouth daily as needed (For nail and hair growth.).     camphor-menthol lotion  Commonly known as:  SARNA  Apply topically as needed for itching.     carvedilol 6.25 MG tablet  Commonly known as:  COREG  Take 1 tablet (6.25 mg total) by mouth 2 (two) times daily with a meal.     Cholecalciferol 1000 UNITS tablet  Take 1,000 Units by mouth daily.     dronabinol 5 MG capsule  Commonly known as:  MARINOL  Take 1 capsule (5 mg total) by mouth 2 (two) times daily before lunch and supper.     lidocaine-prilocaine cream  Commonly known as:  EMLA  Apply 1 application topically daily as needed (Applies to port-a-cath.).     LORazepam 0.5 MG tablet  Commonly known as:  ATIVAN  Take 1 tablet (0.5 mg total) by mouth every 8 (eight) hours as needed for anxiety (or sleep).     losartan 25 MG tablet  Commonly known as:  COZAAR  Take 25 mg by mouth at  bedtime.     mirtazapine 15 MG tablet  Commonly known as:  REMERON  Take 1 tablet (15 mg total) by mouth at bedtime.     ondansetron 4 MG disintegrating tablet  Commonly known as:  ZOFRAN ODT  Take 1 tablet (4 mg total) by mouth every 6 (six) hours as needed for nausea or vomiting.     ondansetron 4 MG tablet  Commonly known as:  ZOFRAN  Take 1 tablet (4 mg total) by mouth every 6 (six) hours as needed for nausea.     oxyCODONE 5 MG immediate release tablet  Commonly known as:  Oxy IR/ROXICODONE  Take 1-2 tablets (5-10 mg total) by mouth every 4 (four) hours as needed.     OxyCODONE 10 mg T12a 12 hr tablet  Commonly known as:  OXYCONTIN  Take 1 tablet (10 mg total) by mouth every 12 (twelve) hours.           Follow-up Information   Follow up with Drue Second, MD In 1 week. (For your final biopsy results and any further treatment recommendations)    Specialty:  Oncology   Contact information:   [address]  [place] [address] [phone]        The results of significant diagnostics from this hospitalization (including imaging, microbiology, ancillary and laboratory) are listed below for reference.    Significant Diagnostic Studies: Ct Chest W Contrast  08/22/2013   CLINICAL DATA:  Breast cancer, chemotherapy ongoing. Patient's experienced Chest pain during chemotherapy.  EXAM: CT CHEST, ABDOMEN, AND PELVIS WITH CONTRAST  TECHNIQUE: Multidetector CT imaging of the chest, abdomen and pelvis was performed following the standard protocol during bolus administration of intravenous contrast.  CONTRAST:  80mL OMNIPAQUE IOHEXOL 300 MG/ML  SOLN  COMPARISON:  CT 05/16/2013, PET-CT 09/29/2011  FINDINGS: CT CHEST FINDINGS  There is a port in the right chest wall. No axillary or supraclavicular lymphadenopathy. No subpectoral is lymphadenopathy. No internal mammary lymphadenopathy. No mediastinal hilar lymphadenopathy.  There is no central pulmonary embolism. No acute findings of the aorta great vessels. No pericardial fluid. Esophagus is normal.  Review of the lung parenchyma demonstrates no suspicious pulmonary nodules. No pneumothorax.  CT ABDOMEN AND PELVIS FINDINGS  There is moderate periportal edema. The liver parenchyma is ill-definedcentrally at the junction of the hepatic veins (image 54). There is subcapsular hypodensity along the right hepatic lobe medially (image 68). There is moderate edema of the gallbladder wall. These findings are new from prior. The pancreas, spleen, adrenal glands are normal. The kidneys are normal.  The stomach, small bowel, appendix, and cecum are normal. The colon and rectosigmoid colon are normal.  Abdominal or is normal caliber. No retroperitoneal periportal lymphadenopathy  No free fluid the pelvis. Uterus and ovaries are normal. Bladder is normal. No pelvic lymphadenopathy. Review of bone windows demonstrates no aggressive osseous lesions.  IMPRESSION: 1. Periportal  edema, subcapsular hypodensity and gallbladder wall edema of unclear etiology. This could relate to liver dysfunction or volume overload. Cannot exclude hepatic metastasis. Recommend correlation with liver function tests and consider hepatic MRI with without contrast.  2. No evidence of thoracic metastasis.  This was made a call report.   Electronically Signed   By: Genevive Bi M.D.   On: 08/22/2013 17:02   Mr Abdomen W Wo Contrast  08/28/2013   CLINICAL DATA:  Breast carcinoma. Indeterminate liver lesion seen on recent CT.  EXAM: MRI ABDOMEN WITH AND WITHOUT CONTRAST  TECHNIQUE: Multiplanar multisequence MR imaging  of the abdomen was performed both before and after administration of intravenous contrast.  CONTRAST:  4 cc Eovist  COMPARISON:  CT on 08/22/2013 and 05/16/2013  FINDINGS: Diffuse periportal edema is seen in both the right and left hepatic lobes, but no liver masses are identified. Nondistended gallbladder also shows mild diffuse wall thickening consistent with edema. There is no evidence of biliary ductal dilatation.  The pancreas, adrenal glands, kidneys, and spleen are normal in appearance. No soft tissue masses or lymphadenopathy identified within the abdomen or pelvis. No focal inflammatory process or abnormal fluid collections identified.  IMPRESSION: Hepatic periportal and gallbladder wall edema, which is nonspecific. No etiology apparent by MRI.  No evidence of hepatic metastases or other intra-abdominal metastatic disease.   Electronically Signed   By: Myles Rosenthal M.D.   On: 08/28/2013 14:14   Ct Abdomen Pelvis W Contrast  08/22/2013   CLINICAL DATA:  Breast cancer, chemotherapy ongoing. Patient's experienced Chest pain during chemotherapy.  EXAM: CT CHEST, ABDOMEN, AND PELVIS WITH CONTRAST  TECHNIQUE: Multidetector CT imaging of the chest, abdomen and pelvis was performed following the standard protocol during bolus administration of intravenous contrast.  CONTRAST:  80mL OMNIPAQUE  IOHEXOL 300 MG/ML  SOLN  COMPARISON:  CT 05/16/2013, PET-CT 09/29/2011  FINDINGS: CT CHEST FINDINGS  There is a port in the right chest wall. No axillary or supraclavicular lymphadenopathy. No subpectoral is lymphadenopathy. No internal mammary lymphadenopathy. No mediastinal hilar lymphadenopathy.  There is no central pulmonary embolism. No acute findings of the aorta great vessels. No pericardial fluid. Esophagus is normal.  Review of the lung parenchyma demonstrates no suspicious pulmonary nodules. No pneumothorax.  CT ABDOMEN AND PELVIS FINDINGS  There is moderate periportal edema. The liver parenchyma is ill-definedcentrally at the junction of the hepatic veins (image 54). There is subcapsular hypodensity along the right hepatic lobe medially (image 68). There is moderate edema of the gallbladder wall. These findings are new from prior. The pancreas, spleen, adrenal glands are normal. The kidneys are normal.  The stomach, small bowel, appendix, and cecum are normal. The colon and rectosigmoid colon are normal.  Abdominal or is normal caliber. No retroperitoneal periportal lymphadenopathy  No free fluid the pelvis. Uterus and ovaries are normal. Bladder is normal. No pelvic lymphadenopathy. Review of bone windows demonstrates no aggressive osseous lesions.  IMPRESSION: 1. Periportal edema, subcapsular hypodensity and gallbladder wall edema of unclear etiology. This could relate to liver dysfunction or volume overload. Cannot exclude hepatic metastasis. Recommend correlation with liver function tests and consider hepatic MRI with without contrast.  2. No evidence of thoracic metastasis.  This was made a call report.   Electronically Signed   By: Genevive Bi M.D.   On: 08/22/2013 17:02   Nm Hepato W/eject Fract  09/04/2013   CLINICAL DATA:  Elevated bilirubin. History of breast cancer and hepatitis. Abdominal pain and nausea.  EXAM: NUCLEAR MEDICINE HEPATOBILIARY IMAGING  TECHNIQUE: Sequential images of  the abdomen were obtained out to 60 minutes following intravenous administration of radiopharmaceutical.  COMPARISON:  MRI 08/28/2013.  RADIOPHARMACEUTICALS:  5 mCi Tc-58m Choletec  FINDINGS: There is delayed hepatic uptake of radiotracer with gradual accumulation of radiotracer over 95 min. The common bile duct is never visualized. There is no visualization of the gallbladder and no excretion of contrast into the small bowel, not unexpected given the lack of excretion of radiotracer from the liver. Free radiotracer is evident in the urinary bladder.  IMPRESSION: Hepatic uptake without excretion is compatible with  either profound cholestasis or hepatic dysfunction/ failure.   Electronically Signed   By: Andreas Newport M.D.   On: 09/04/2013 13:48   US Biopsy  09/12/2013   CLINICAL DATA:  History of breast carcinoma. Central tumor of the liver with jaundice and hepatic failure. A PET scan shows abnormal hypermetabolic activity within the central hepatic tumor.  EXAM: ULTRASOUND GUIDED CORE BIOPSY OF LIVER  MEDICATIONS: 0.5 mg IV Versed; 25 mcg IV Fentanyl  Total Moderate Sedation Time: 15  PROCEDURE: The procedure, risks, benefits, and alternatives were explained to the patient. Questions regarding the procedure were encouraged and answered. The patient understands and consents to the procedure.  The abdominal wall was prepped with Betadine in a sterile fashion, and a sterile drape was applied covering the operative field. A sterile gown and sterile gloves were used for the procedure. Local anesthesia was provided with 1% Lidocaine.  Ultrasound was used to localize hepatic tumor. Under ultrasound guidance, a 17 gauge needle was advanced into the liver. A total of three separate coaxial 18 gauge needle core biopsy samples were obtained. Material was submitted in formalin.  COMPLICATIONS: None.  FINDINGS: Vague hypoechoic central tumor in the liver was visible by ultrasound. Solid tissue was obtained.  IMPRESSION:  Ultrasound-guided core biopsy performed of the central hepatic tumor.   Electronically Signed   By: Irish Lack M.D.   On: 09/12/2013 18:27    Labs:  Basic Metabolic Panel:  Recent Labs Lab 09/11/13 1101 09/12/13 0511 09/13/13 0520 09/14/13 0645 09/15/13 0540  NA 130* 129* 130* 131* 130*  K 3.6 3.4* 3.2* 3.6 3.5  CL 93* 93* 97 97 95*  CO2 28 25 24 24 24   GLUCOSE 92 100* 105* 91 94  BUN 9 8 9  4* 5*  CREATININE 0.2* 0.45* 0.47* 0.46* 0.41*  CALCIUM 8.7 8.7 8.1* 8.2* 8.5   GFR Estimated Creatinine Clearance: 51.7 ml/min (by C-G formula based on Cr of 0.41). Liver Function Tests:  Recent Labs Lab 09/11/13 1101 09/12/13 0511 09/13/13 0520 09/14/13 0645 09/15/13 0540  AST 152* 135* 120* 128* 138*  ALT 89* 77* 66* 64* 67*  ALKPHOS 741* 767* 696* 662* 664*  BILITOT 25.8* 21.0* 19.5* 20.0* 21.3*  PROT 7.1 6.1 5.6* 5.4* 5.7*  ALBUMIN 2.3* 2.0* 1.6* 1.6* 1.7*   Coagulation profile  Recent Labs Lab 09/09/13 1155 09/11/13 1317 09/12/13 0511 09/15/13 1135  INR 1.4* 1.12 1.09 1.38    CBC:  Recent Labs Lab 09/09/13 1155 09/12/13 0511 09/13/13 0520 09/15/13 0540  WBC 9.6 9.0 7.4 9.1  NEUTROABS 6.9  --   --   --   HGB 11.9* 10.7* 9.9* 10.3*  HCT 35.1* 31.2* 29.0* 29.3*  MCV 96.3 93.4 95.1 94.5  PLT 313.0 281 237 217    Time coordinating discharge: 35 minutes.  Signed:  RAMA,CHRISTINA  Pager [phone] [facility] 09/16/2013, 11:08 AM

## 2013-09-17 ENCOUNTER — Other Ambulatory Visit (HOSPITAL_COMMUNITY): Payer: PRIVATE HEALTH INSURANCE

## 2013-09-22 ENCOUNTER — Encounter: Payer: Self-pay | Admitting: Oncology

## 2013-09-22 NOTE — Progress Notes (Signed)
Rosalita Chessman, RN Case Manager called to see which Hospice patient went with after she left the hospital. Note from [facility] Case Manager said [facility] and I gave her that number last week.  I called her husband and he said [facility] [phone] and she is receiving in home Hospice.  I told to ask for [facility] at ext. [phone].  Suzanne's # is [phone] ext. [phone].

## 2013-10-03 ENCOUNTER — Other Ambulatory Visit: Payer: Self-pay | Admitting: *Deleted

## 2013-10-03 NOTE — Telephone Encounter (Signed)
Call from [place], RN with Hopsice. Pt visited by Hospice after discharged from Hospital and received an order for pain medication to be  changed to MS Cotintin 60mg   2 tablets q 2 hrs and  Roxinol 20mg /ml q 0.73ml-0.75ml q2hrs. Med list updated.

## 2013-10-06 ENCOUNTER — Telehealth: Payer: Self-pay | Admitting: *Deleted

## 2013-10-06 NOTE — Telephone Encounter (Signed)
Ok to draw

## 2013-10-06 NOTE — Telephone Encounter (Signed)
Andreya, RN from Hospice called pt requesting liver function tests to be drawn. Will review with provider.

## 2013-10-07 NOTE — Telephone Encounter (Signed)
Per MD, Verbal order given to Luetta, Hospice RN for Liver function test. Labs to be drawn on wed 10/08/13.Monika reports pt has some energy. No further concerns.

## 2013-10-14 ENCOUNTER — Encounter: Payer: Self-pay | Admitting: Internal Medicine

## 2013-10-23 DEATH — deceased

## 2013-10-24 ENCOUNTER — Telehealth: Payer: Self-pay

## 2013-10-24 NOTE — Telephone Encounter (Signed)
Patient past away @ [facility] per [facility]le in [place]  asw

## 2014-06-17 ENCOUNTER — Other Ambulatory Visit: Payer: Self-pay | Admitting: Pharmacist

## 2015-08-24 IMAGING — XA IR CENTRAL VENOUS CATHETER
2 series · 5 of 5 positions shown · IV contrast (IODINE)
Comparison: Chest radiograph - 08/01/2013.

CLINICAL DATA: Neck swelling following infusion of existing port
catheter.

PORT INJECTION

[Series 1: care single · 1 of 1 slices shown]
[im 1/1]
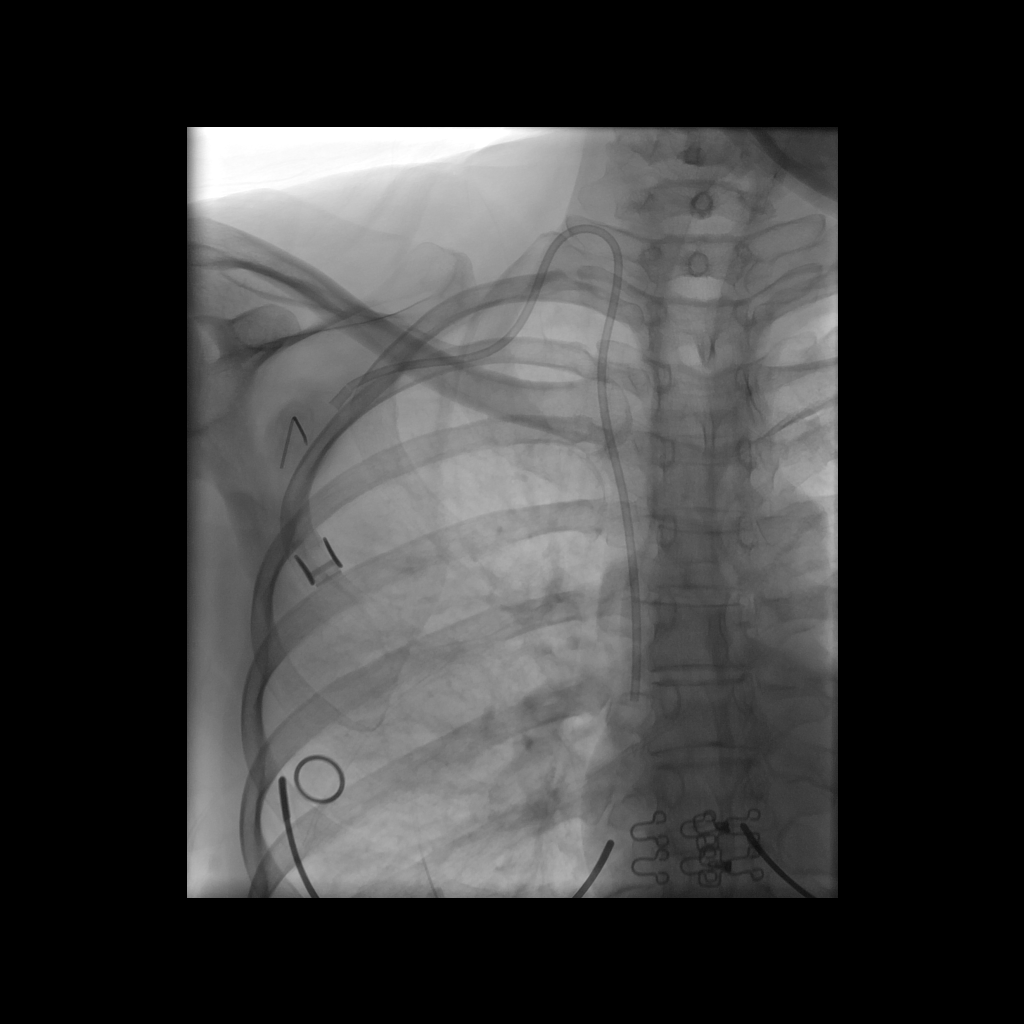

[Series 2: shuntogram · 4 of 9 frames shown]
[frame 2/9]
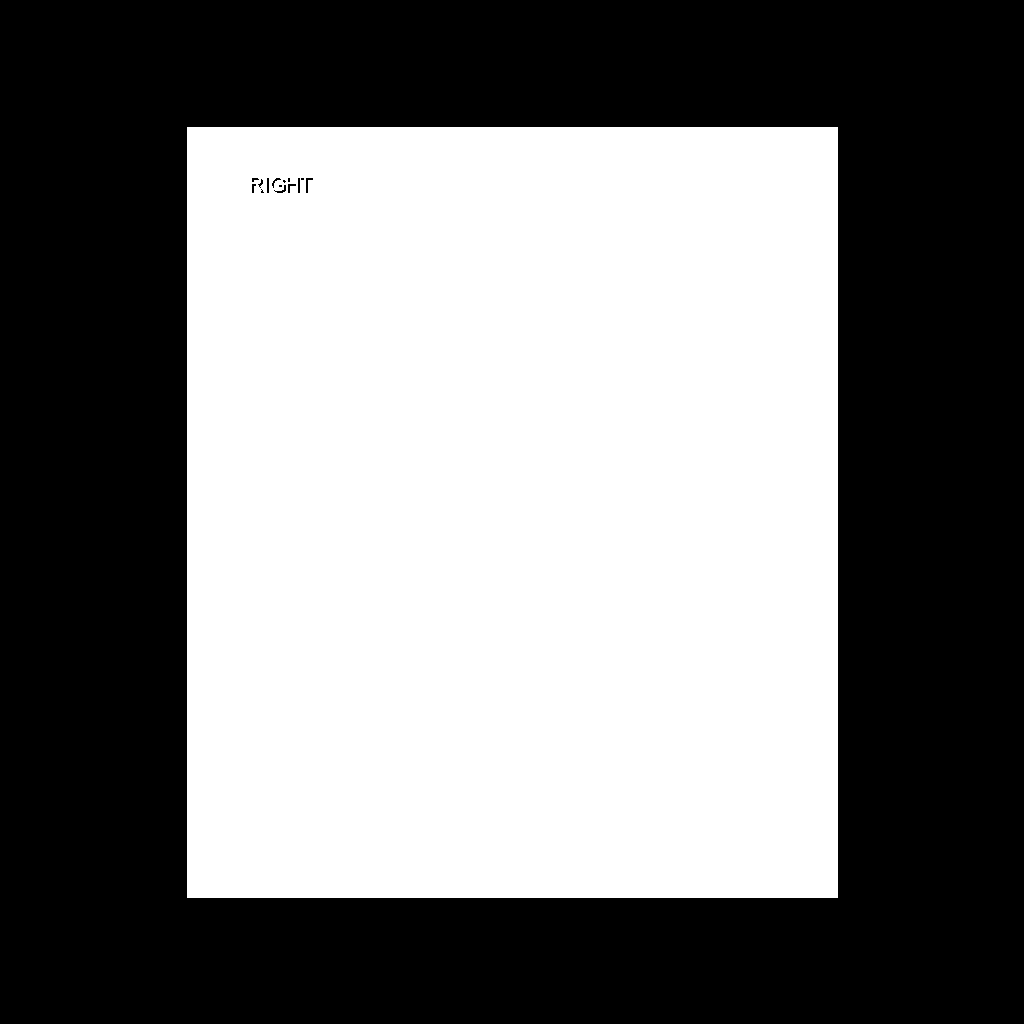
[frame 5/9]
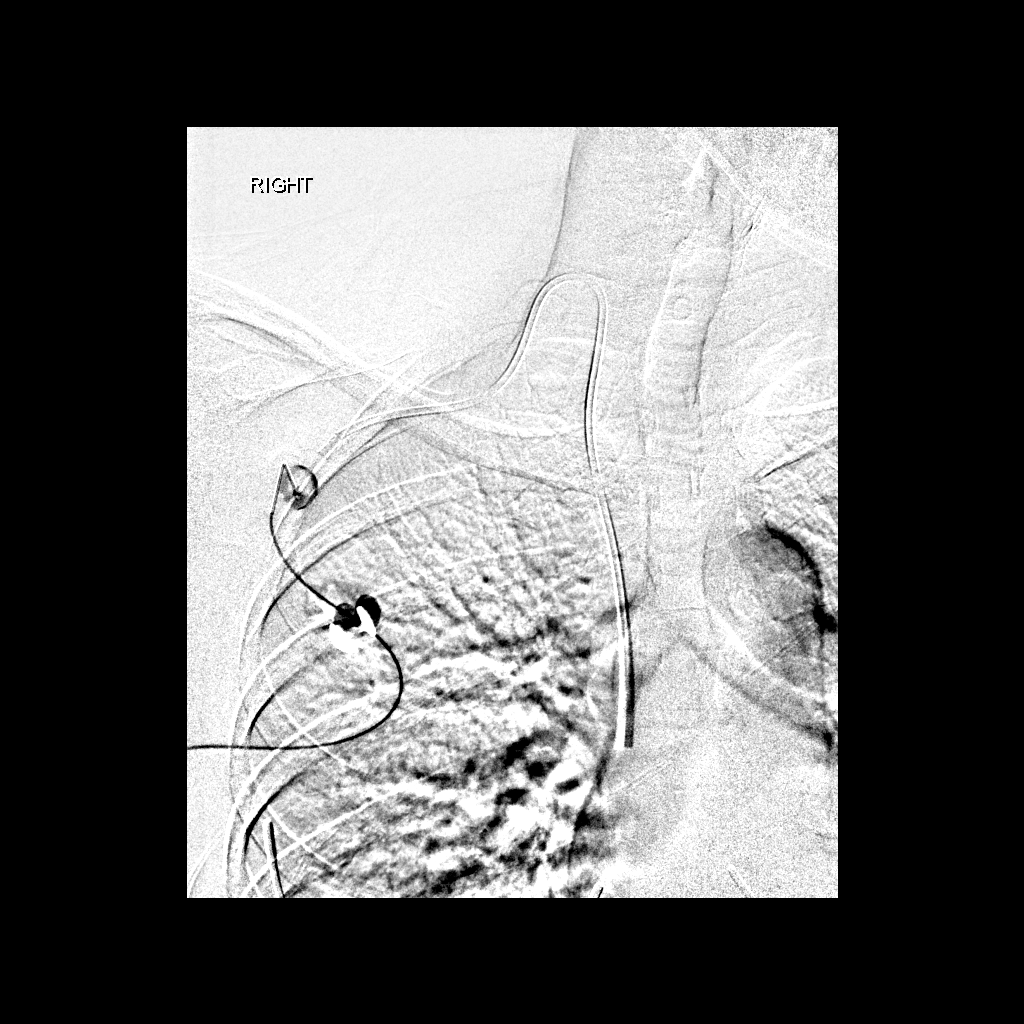
[frame 8/9]
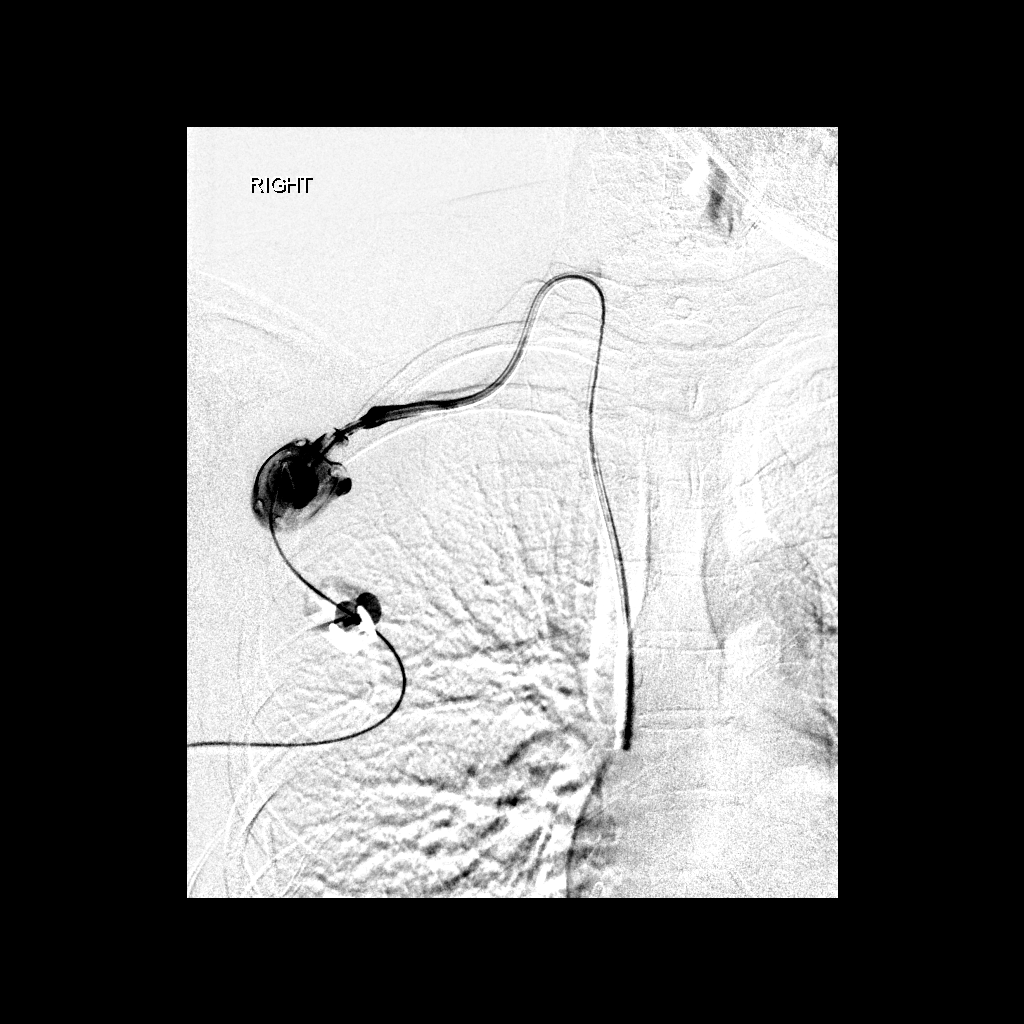
[frame 9/9]
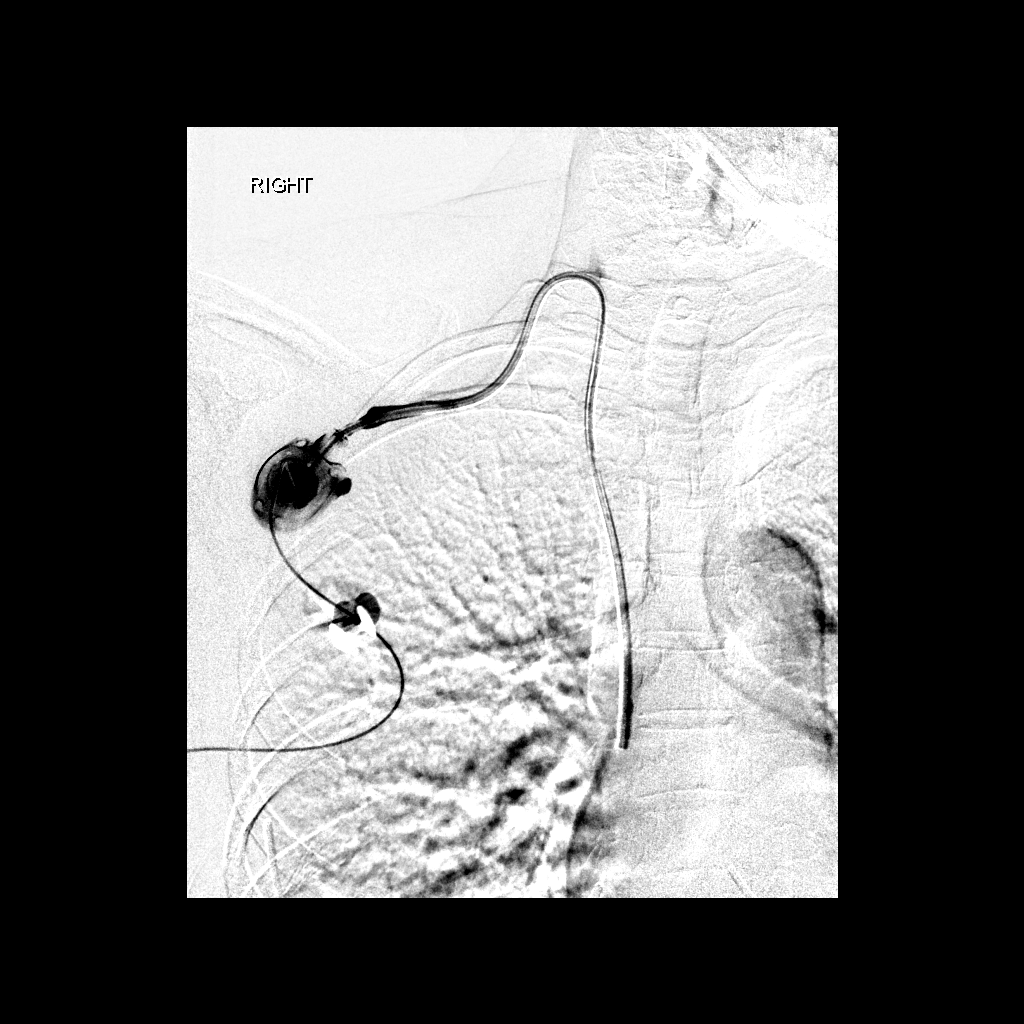

[5 of 5 positions shown; findings below may reference images not displayed]

Fluoroscopy time:  6 seconds

Complications:  None immediate

Technique / Findings:

The port-a-catheter was sterilely accessed with Kumiko Drown needle.

A pre-procedural spot radiographic image was obtained
redemonstrating the suspected fracture of the proximal port
catheter tubing regional to the connection with the port reservoir.
Limited contrast injection confirms extravasation of contrast at
this location with passage of contrast along the port catheter
sheath to the level of the right internal jugular vein.
IMPRESSION: Fractured port-a-catheter tubing regional to the connection of the
tubing and port catheter reservoir.  This port catheter is no
longer safe for medication administration or blood draws.

PLAN:

Above findings were discussed with Dr. Tokan and the decision was
made to have the patient return later this week ([DATE]) for port-a-
catheter revision.

## 2015-08-26 IMAGING — US IR REMOVAL TUNNELED CV CATH W/PORT/PUMP
1 series · 2 of 2 positions shown · non-contrast
Comparison: none

CLINICAL DATA: Breast cancer, damaged leaking right IJ port
catheter

[Series 1: sp removal tun access w/ port w/o fl · non-contrast · 2 of 2 slices shown]
[im 1/2]
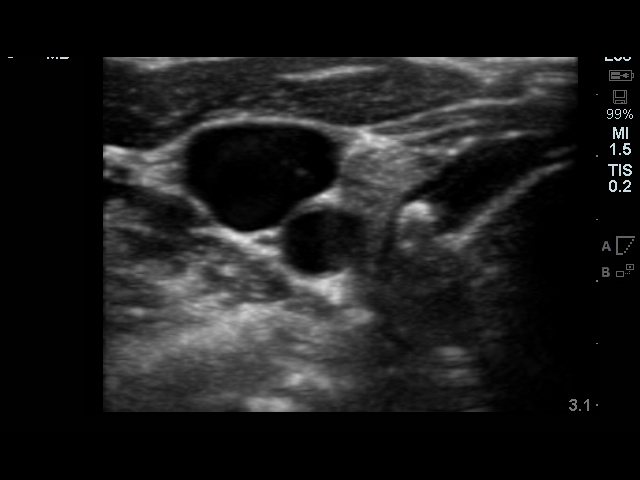
[im 2/2]
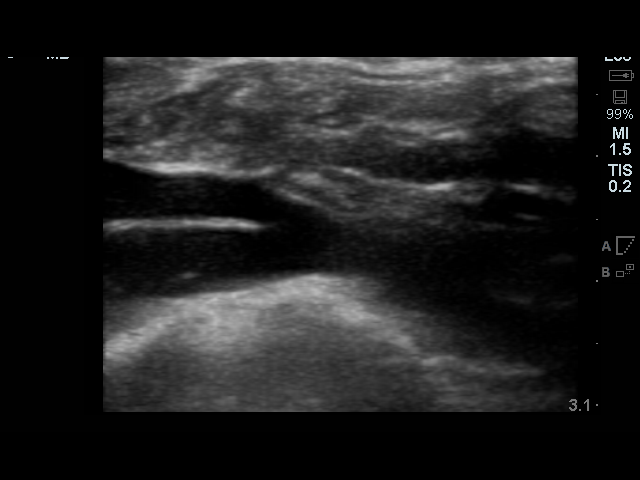

[2 of 2 positions shown; findings below may reference images not displayed]

RIGHT IJ PORT CATHETER REMOVAL

Date:  08/07/2013 [DATE]

Radiologist:  Nghia Mcgrady, M.D.

Medications:  1 gram ancef administered within 1 hour of the
procedure, 3 mg Versed, 150 mcg Fentanyl

Complications:  No immediate

PROCEDURE/FINDINGS:

Informed consent was obtained from the patient following
explanation of the procedure, risks, benefits and alternatives.
The patient understands, agrees and consents for the procedure.
All questions were addressed.  A time out was performed.

Maximal barrier sterile technique utilized including caps, mask,
sterile gowns, sterile gloves, large sterile drape, hand hygiene,
and Chloroprep.

Under sterile conditions and local anesthesia, an incision was made
along the existing right port catheter site.  Sharp and blunt
dissection utilized to remove the port catheter and tubing
entirely.   Subcutaneous pocket flushed with saline.  There was
adequate hemostasis.  Incision was closed in a two layer fashion
with interrupted and running Vicryl suture followed by Dermabond.
No immediate complication.  The patient tolerated the procedure
well.
IMPRESSION: Successful right IJ damaged leaking port catheter removal

ULTRASOUND GUIDANCE FOR VASCULAR ACCESS
NEW RIGHT IJ SINGLE LUMEN POWER PORT CATHETER INSERTION

Date: 08/07/2013 [DATE]

Radiologist:  Nghia Mcgrady, M.D.

Medications:  As above for both procedures.

Guidance:  Ultrasound and fluoroscopic

Fluoroscopy time:  1 minute 42 seconds

Sedation time:  50 minutes

Contrast volume:  None.

Complications:  No immediate

PROCEDURE/FINDINGS:

Informed consent was obtained from the patient following
explanation of the procedure, risks, benefits and alternatives.
The patient understands, agrees and consents for the procedure.
All questions were addressed.  A time out was performed.

Maximal barrier sterile technique utilized including caps, mask,
sterile gowns, sterile gloves, large sterile drape, hand hygiene,
and 2% chlorhexidine scrub.

Under sterile conditions and local anesthesia, right internal
jugular micropuncture venous access was performed.  Access was
performed with ultrasound.  Images were obtained for documentation.
A guide wire was inserted followed by a transitional dilator.  This
allowed insertion of a guide wire and catheter into the IVC.
Measurements were obtained from the SVC / RA junction back to the
right IJ venotomy site.  In the right infraclavicular chest, a
subcutaneous pocket was created over the second anterior rib.  This
was done under sterile conditions and local anesthesia.  1%
lidocaine with epinephrine was utilized for this.  A 2.5 cm
incision was made in the skin.  Blunt dissection was performed to
create a subcutaneous pocket over the right pectoralis major
muscle.  The pocket was flushed with saline vigorously.  There was
adequate hemostasis.  The port catheter was assembled and checked
for leakage.  The port catheter was secured in the pocket with two
retention sutures.  The tubing was tunneled subcutaneously to the
right venotomy site and inserted into the SVC/RA junction through a
valved peel-away sheath.  Position was confirmed with fluoroscopy.
Images were obtained for documentation.  The patient tolerated the
procedure well.  No immediate complications.  Incisions were closed
in a two layer fashion with 4 - 0 Vicryl suture.  Dermabond was
applied to the skin. The port catheter was accessed, blood was
aspirated followed by saline and heparin flushes.  Needle was
removed.  A dry sterile dressing was applied.
IMPRESSION: Ultrasound and fluoroscopically guided new right internal jugular
single lumen slim power port catheter insertion.  Tip in the SVC/RA
junction.  Catheter ready for use.
